# Patient Record
Sex: Male | Born: 1950 | Race: White | Hispanic: No | Marital: Married | Smoking: Former smoker
Health system: Southern US, Community
[De-identification: ages and names within clinical notes are randomized; demographics above are authoritative.]

## PROBLEM LIST (undated history)

## (undated) DIAGNOSIS — I10 Essential (primary) hypertension: Secondary | ICD-10-CM

## (undated) DIAGNOSIS — D49519 Neoplasm of unspecified behavior of unspecified kidney: Secondary | ICD-10-CM

## (undated) DIAGNOSIS — E785 Hyperlipidemia, unspecified: Secondary | ICD-10-CM

## (undated) DIAGNOSIS — M1990 Unspecified osteoarthritis, unspecified site: Secondary | ICD-10-CM

## (undated) DIAGNOSIS — G629 Polyneuropathy, unspecified: Secondary | ICD-10-CM

## (undated) DIAGNOSIS — C801 Malignant (primary) neoplasm, unspecified: Secondary | ICD-10-CM

## (undated) DIAGNOSIS — Z87442 Personal history of urinary calculi: Secondary | ICD-10-CM

## (undated) DIAGNOSIS — R269 Unspecified abnormalities of gait and mobility: Secondary | ICD-10-CM

## (undated) DIAGNOSIS — E1142 Type 2 diabetes mellitus with diabetic polyneuropathy: Secondary | ICD-10-CM

## (undated) DIAGNOSIS — G5603 Carpal tunnel syndrome, bilateral upper limbs: Secondary | ICD-10-CM

## (undated) DIAGNOSIS — G459 Transient cerebral ischemic attack, unspecified: Secondary | ICD-10-CM

## (undated) DIAGNOSIS — G4733 Obstructive sleep apnea (adult) (pediatric): Secondary | ICD-10-CM

## (undated) DIAGNOSIS — H409 Unspecified glaucoma: Secondary | ICD-10-CM

## (undated) DIAGNOSIS — K219 Gastro-esophageal reflux disease without esophagitis: Secondary | ICD-10-CM

## (undated) DIAGNOSIS — Z9989 Dependence on other enabling machines and devices: Secondary | ICD-10-CM

## (undated) DIAGNOSIS — E119 Type 2 diabetes mellitus without complications: Secondary | ICD-10-CM

## (undated) HISTORY — PX: COLONOSCOPY: SHX174

## (undated) HISTORY — DX: Carpal tunnel syndrome, bilateral upper limbs: G56.03

## (undated) HISTORY — DX: Type 2 diabetes mellitus with diabetic polyneuropathy: E11.42

## (undated) HISTORY — DX: Dependence on other enabling machines and devices: Z99.89

## (undated) HISTORY — DX: Morbid (severe) obesity due to excess calories: E66.01

## (undated) HISTORY — PX: NASAL SEPTUM SURGERY: SHX37

## (undated) HISTORY — DX: Obstructive sleep apnea (adult) (pediatric): G47.33

## (undated) HISTORY — PX: BACK SURGERY: SHX140

## (undated) HISTORY — DX: Transient cerebral ischemic attack, unspecified: G45.9

## (undated) HISTORY — DX: Hyperlipidemia, unspecified: E78.5

## (undated) HISTORY — DX: Unspecified abnormalities of gait and mobility: R26.9

## (undated) HISTORY — PX: SPINAL FUSION: SHX223

## (undated) HISTORY — DX: Unspecified glaucoma: H40.9

## (undated) HISTORY — PX: OTHER SURGICAL HISTORY: SHX169

---

## 1998-07-09 ENCOUNTER — Ambulatory Visit: Admission: RE | Admit: 1998-07-09 | Discharge: 1998-07-09 | Payer: Self-pay | Admitting: Otolaryngology

## 1998-08-24 ENCOUNTER — Ambulatory Visit: Admission: RE | Admit: 1998-08-24 | Discharge: 1998-08-24 | Payer: Self-pay | Admitting: Otolaryngology

## 2000-07-29 ENCOUNTER — Ambulatory Visit (HOSPITAL_COMMUNITY): Admission: RE | Admit: 2000-07-29 | Discharge: 2000-07-29 | Payer: Self-pay | Admitting: Family Medicine

## 2000-07-29 ENCOUNTER — Encounter: Payer: Self-pay | Admitting: Family Medicine

## 2001-04-18 ENCOUNTER — Ambulatory Visit (HOSPITAL_COMMUNITY): Admission: RE | Admit: 2001-04-18 | Discharge: 2001-04-18 | Payer: Self-pay | Admitting: Cardiology

## 2004-10-18 ENCOUNTER — Encounter: Admission: RE | Admit: 2004-10-18 | Discharge: 2005-01-16 | Payer: Self-pay | Admitting: Family Medicine

## 2005-04-06 ENCOUNTER — Ambulatory Visit (HOSPITAL_BASED_OUTPATIENT_CLINIC_OR_DEPARTMENT_OTHER): Admission: RE | Admit: 2005-04-06 | Discharge: 2005-04-06 | Payer: Self-pay | Admitting: *Deleted

## 2005-04-10 ENCOUNTER — Ambulatory Visit: Payer: Self-pay | Admitting: Internal Medicine

## 2005-07-12 ENCOUNTER — Encounter: Admission: RE | Admit: 2005-07-12 | Discharge: 2005-07-12 | Payer: Self-pay | Admitting: Family Medicine

## 2005-07-20 ENCOUNTER — Encounter: Admission: RE | Admit: 2005-07-20 | Discharge: 2005-07-20 | Payer: Self-pay | Admitting: Family Medicine

## 2005-07-22 ENCOUNTER — Encounter: Admission: RE | Admit: 2005-07-22 | Discharge: 2005-07-22 | Payer: Self-pay | Admitting: Family Medicine

## 2006-04-07 ENCOUNTER — Encounter: Admission: RE | Admit: 2006-04-07 | Discharge: 2006-04-07 | Payer: Self-pay | Admitting: Orthopaedic Surgery

## 2006-07-05 ENCOUNTER — Inpatient Hospital Stay (HOSPITAL_COMMUNITY): Admission: RE | Admit: 2006-07-05 | Discharge: 2006-07-07 | Payer: Self-pay | Admitting: Orthopaedic Surgery

## 2007-07-06 ENCOUNTER — Encounter: Admission: RE | Admit: 2007-07-06 | Discharge: 2007-07-06 | Payer: Self-pay | Admitting: Orthopaedic Surgery

## 2007-07-18 ENCOUNTER — Inpatient Hospital Stay (HOSPITAL_COMMUNITY): Admission: RE | Admit: 2007-07-18 | Discharge: 2007-07-20 | Payer: Self-pay | Admitting: Orthopaedic Surgery

## 2008-08-18 IMAGING — CR DG LUMBAR SPINE 1V
1 series · 1 of 1 positions shown · non-contrast
Comparison: 07/07/2006

LUMBAR SPINE - ONE VIEW:

CLINICAL DATA: L4-L5 revision laminectomy with fusion and pedicle screws.

[view not recorded]
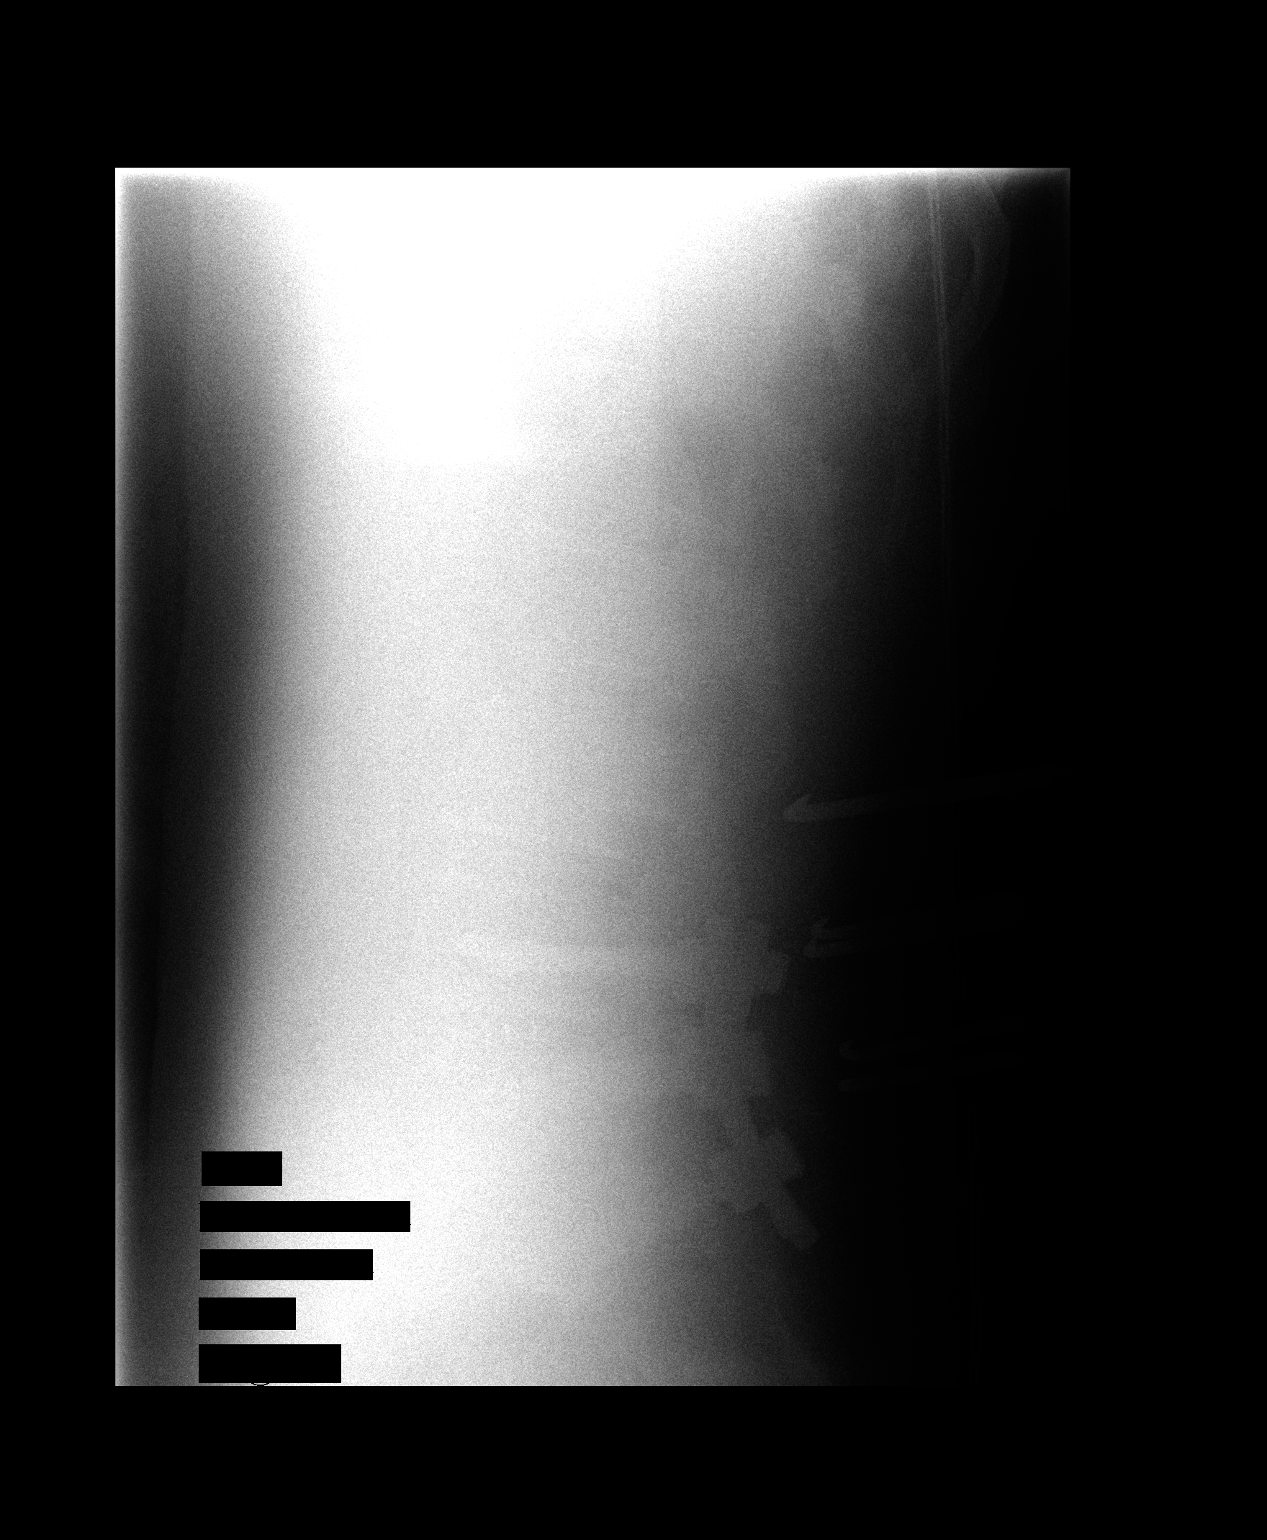

[1 of 1 positions shown; findings below may reference images not displayed]

FINDINGS: Intraoperative portable film obtained using crosstable lateral
technique at 0173 hours is limited by technique and body habitus. Bony anatomy
is not well demonstrated. Pedicle screws are seen at 3 levels and there are
posterior soft tissue retractors in the lower back. The patient had pedicle
screws previously at L5-S1 and the most cranial screws on this exam are probably
at the L4 level.
IMPRESSION: Intraoperative localization. Consider nonportable dedicated lumbar spine films
to more thoroughly characterize bony anatomy and fusion hardware.

## 2009-10-17 ENCOUNTER — Encounter: Admission: RE | Admit: 2009-10-17 | Discharge: 2009-10-17 | Payer: Self-pay | Admitting: Orthopaedic Surgery

## 2010-07-19 ENCOUNTER — Encounter: Admission: RE | Admit: 2010-07-19 | Discharge: 2010-07-19 | Payer: Self-pay | Admitting: Family Medicine

## 2011-03-15 NOTE — Op Note (Signed)
NAME:  TRACE, WIRICK NO.:  [id number]   MEDICAL RECORD NO.:  [id number]          PATIENT TYPE:  INP   LOCATION:  5011                         FACILITY:  [facility]   PHYSICIAN:  Sharolyn Douglas, M.D.        DATE OF BIRTH:  October 18, 1951   DATE OF PROCEDURE:  07/18/2007  DATE OF DISCHARGE:                               OPERATIVE REPORT   DIAGNOSES:  1. 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  2. Severe right lower extremity radiculopathy.  3. Obesity.   PROCEDURE:  1. Removal of L5-S1 instrumentation with exploration of L5-S1 fusion.  2. Revision L4-5 laminectomy with decompression of the thecal sac and      removal of a large disk herniation.  3. Posterior spinal fusion L4-5.  4. Segmental pedicle screw instrumentation L4 through S1 using the      Abbott spine system.  5. Transforaminal lumbar interbody fusion L4-5 with placement of 10-mm      PEEK cage times two.  6. Local autogenous bone graft supplemented with through OP-1 BMP.   SURGEON:  Sharolyn Douglas, M.D.   ASSISTANT:  Orlin Hilding, P.A.   ANESTHESIA:  General endotracheal.   ESTIMATED BLOOD LOSS:  50 mL.   COMPLICATIONS:  None.   NEEDLE AND SPONGE COUNT:  Correct.   INDICATIONS:  The patient is a pleasant 60 year old male who is status  post previous L5-S1 fusion for spondylolisthesis.  He did very well  after that surgery and had made a complete recovery.  Recently he was  lying down working on a motorcycle and when he went to get up developed  severe back and right leg pain.  MRI scan shows a large disk herniation  at L4-5 above the fusion with near stenosis and compression of the L5  nerve root.  Due to his severe intractable pain he elected to undergo  extension of the decompression and fusion of the L4-5 segment.  Risks,  benefits and alternatives were reviewed and patient elected to proceed.   DESCRIPTION OF PROCEDURE:  After informed consent, he was  taken to the  operating room.  He underwent general endotracheal anesthesia without  difficulty and given prophylactic IV antibiotics.  Neuromonitoring was  established in the form of lower extremity EMGs and SSEPs.  He carefully  turned prone onto the Wilson frame.  All bony prominences were padded.  Face and eyes were protected at all times.  The back was prepped and  draped in the usual sterile fashion.  The previous incision was reopened  and dissection was carried through the scar.  A subperiosteal exposure  was carried out over the instrumentation at L5-S1 and the transverse  processes of L4.  Due to the scarring and also the patient's large body  habitus, this was somewhat difficult.  We placed deep retractors.  We  then removed the instrumentation at L5-S1 by using the appropriate  screwdrivers to loosen the locking caps and then removed the rod.  We  then performed further dissection using the electrocautery out  over the  facet joints.  We confirmed that there was a solid arthrodesis between  L5 and S1 posteriorly.  We then turned our attention to placing pedicle  screws at L4.   Using anatomic probing technique each pedicle starting point was  initiated using the awl.  The pedicles were then cannulated.  The  pedicles were then palpated using a ball feeler.  There were no  breaches.  Each pedicle was then tapped.  We placed 6.5 x 15 mm screws  bilaterally.  We then used triggered EMGs testing the pedicle screws at  L4.  There were no deleterious changes.   We then turned our attention to performing a revision laminectomy.  The  edges of the previous laminotomy defects were carefully dissected using  loupes and headlight magnification.  Once we had entered into the spinal  canal, the high-speed bur and Kerrison punches were used to remove the  spinous process and lamina of L4.  We performed a wide laminectomy.  We  decompressed the right L5 nerve root.  We encountered some  scarring and  this was dissected again with the curettes and then the lateral recess  was decompressed.  Once we were satisfied with the decompression, we  turned our attention to complete the posterior spinal fusion.   High-speed bur was used to decorticate the transverse process of L4 and  also the previous fusion mass at L5-S1.  Local bone graft obtained from  the laminectomy was packed into the lateral gutters between L4 and L5.   At this point we elected to proceed with transforaminal lumbar interbody  fusion in order to further remove the disk herniation and also improve  the fusion rate.  The remaining facet joint on the right side at L4-5  was osteotomized.  A transforaminal window was created.  The exiting and  traversing nerve roots were identified and protected all times.  Free  running EMGs were monitored.  The disk space was entered and a radical  diskectomy was completed.  There was a large disk herniation which was  displacing the thecal sac and this was decompressed back into the  interspace using Epstein curettes and then removed.  This provided  additional decompression of the nerve root.  We then dilated the disk  space up to 10 mm.  The cartilaginous endplates were scraped clean using  sharp curettes.  The disk space was irrigated.  The disk space was then  packed with local bone graft obtained from the laminectomy.  We then  inserted a 10-mm PEEK cage which had been packed with the OP-1 BMP.  The  cage was tamped anteriorly and then flipped horizontally.  This allowed  [place] to place a second cage into the interspace, again by tamping it  anteriorly and then flipping it longitudinally.  We then placed 70-mm  titanium rods into the polyaxial screw heads and compression was applied  across the L4-5 segment before shearing off the locking caps.  Hemostasis was achieved.  The wound was irrigated.  Gelfoam was left  over the exposed epidural space.  A deep Hemovac drain was  left.  The  deep fascia was closed with a running #1 Vicryl suture.  Subcutaneous  layer was closed with 0 Vicryl and 2-0 Vicryl followed by a running 3-0  subcuticular Vicryl suture on the skin edges.  Dermabond was applied.  Sterile dressing was placed.  The patient turned supine, extubated  without difficulty and transferred to recovery in stable  condition.   It should be noted my assistant Orlin Hilding, P.A. was present throughout  the procedure.  She assisted me with the positioning.  She assisted me  with the exposure using the Cobb's and suction.  She then worked with me  using loupes and headlight magnification during the decompression,  providing retraction of the neural elements and suction.  She also  assisted with the instrumentation and the arthrodesis and then assisted  with wound closure.      Sharolyn Douglas, M.D.  Electronically Signed     MC/MEDQ  D:  07/18/2007  T:  07/19/2007  Job:  [id number]

## 2011-03-18 NOTE — Op Note (Signed)
NAME:  Marvin Rivas, Marvin Rivas NO.:  [id number]   MEDICAL RECORD NO.:  [id number]          PATIENT TYPE:  INP   LOCATION:  5023                         FACILITY:  [facility]   PHYSICIAN:  Sharolyn Douglas, M.D.        DATE OF BIRTH:  [birth year]   DATE OF PROCEDURE:  07/05/2006  DATE OF DISCHARGE:                                 OPERATIVE REPORT   DIAGNOSES:  1. Isthmic L5-S1 mobile spondylolisthesis.  2. Severe L5-S1 foraminal narrowing, right greater than left, with      radiculopathy.   PROCEDURE:  1. L5-S1 laminectomy with wide decompression of the thecal sac and L5 and      S1 nerve roots bilaterally.  2. L5-S1 transforaminal lumbar interbody fusion with placement of 11-mm      PEEK cage.  3. Pedicle screw instrumentation, L5-S1, using Abbott spine system.  4. Posterior spinal arthrodesis, L5-S1.  5. Local autogenous bone graft supplemented with bone morphogenic protein.   SURGEON:  Sharolyn Douglas, M.D.   ASSISTANT:  Verlin Fester, P.A.   ANESTHESIA:  General endotracheal.   ESTIMATED BLOOD LOSS:  400 mL.   COMPLICATIONS:  None.  Needle and sponge count correct.   INDICATIONS:  The patient is a pleasant 60 year old male with progressive  back and right lower extremity pain.  He has failed to respond to  conservative treatment and now elects to undergo L5-S1 decompression and  fusion.  His imaging studies show a mobile spondylolisthesis at L5-S1 with  severe foraminal narrowing, right greater than left.  Risks, benefits,  alternatives reviewed.  The patient elected to proceed.   PROCEDURE:  After informed consent, the patient was taken to the operating  room.  He underwent general endotracheal anesthesia without difficulty,  given prophylactic IV antibiotics.  Neuro monitoring established in the form  of lower extremity and upper extremity SSEPs and EMGs.  The patient was then  turned prone onto the __________ four-poster positioning frame.  All bony  prominences were  padded.  Because of his large abdomen, great care was taken  in the positioning process.  Because of his size, of course, it was  impossible to completely decompress his belly.  Once we were satisfied with  the positioning, the back was prepped and draped in the usual sterile  fashion.  A midline incision was made from L4 down to S1.  Dissection was  carried sharply through the deep fascia.  The spinous processes were exposed  and then further subperiosteal exposure to the tips of the transverse  processes of L5-S1 were exposed.  The L5 spinous process was easily  identifiable because it was loose secondary to the pars fracture.  Intraoperative x-ray was taken to confirm our levels.  We then removed the  entire spinous process and lamina of L5.  Further decompression removed the  overlying ligamentum flavum using Kerrison punches, loops, and headlight  magnification.  We then identified the L5 nerve roots bilaterally on the  right side.  This was severely encumbered by the slip and trapping of the  nerve root between the L5  pedicle and sacrum.  In addition, there was  fibrocartilaginous material related to the pars defect which was encroaching  on the foramen.  This was all carefully decompressed.  We found similar but  less severe findings on the left side.  Once we were satisfied with our  decompression of the L5-S1 nerve roots bilaterally, we turned our attention  to placing pedicle screws at L5 and S1 using anatomic probing technique.  The pedicles could be palpated from within the spinal canal.  We placed 6.5  x 45-mm screws in L5 and 6.5 x 35-mm screws in the sacrum.  We had good  screw purchase.  Each screw was stimulated using triggered EMGs, and there  were no deleterious changes.  We then turned our attention to completing a  transforaminal lumbar interbody fusion on the right side at L5-S1.  This was  felt to be necessary for further decompression of neural foramen by  providing  for distraction and also to improve the fusion rate considering  the patient has a mobile listhesis.  The remaining facette joint was  osteotomized on the right side.  Dissection was carried down, identifying  the L5 and S1 nerve roots, and great care was taken to protect these  structures.  Free running EMGs were monitored, and disk space was entered.  Radical diskectomy completed across to the contralateral side.  The  cartilaginous endplates were scraped clean.  The disk space was then packed  with local bone graft along with BMP sponges.  An 11-mm PEEK cage was then  inserted into the disk space after distracting on the pedicle screws.  This  was carefully tamped anteriorly and across the midline.  We had good  distraction.  We then rechecked the foramen and found that the L5 nerve  roots were widely decompressed.  Intraoperative x-ray was taken which showed  good positioning of the pedicle screws and also the interbody graft with  good distraction and complete reduction of the spondylolisthesis.  We then  placed short segment titanium rods across the L5-S1 pedicle screws before  shearing off the locking caps and providing for compression.  We then  completed the posterior spinal fusion by decorticating the transverse  processes of L5 and the sacral ala bilaterally.  The remaining local bone  graft was then packed tightly into the lateral gutters.  The dura was  examined, and there was an area along the left side at approximately the  level of the L5 nerve root which had thinned from the decompression.  There  was no definite CSF leakage, but we chose to reinforce this with Tisseel  fibrin glue.  We then closed the fascia with a running #1 Vicryl suture.  Subcutaneous layer closed with 0 Vicryl and 2-0 Vicryl followed by a running  3-0 subcuticular Vicryl suture on the skin edges.  Benzoin and Steri-Strips placed.  Sterile dressing applied.  The patient was turned supine, extubated   without difficulty and transferred to recovery in stable condition.  He was  able to move his upper lower extremities.  It should be noted that my  assistant, Verlin Fester, P.A., was present throughout the procedure  including during the positioning, the exposure, the decompression, the  instrumentation and the fusion, and she also assisted with the entire wound  closure.      Sharolyn Douglas, M.D.  Electronically Signed     MC/MEDQ  D:  07/05/2006  T:  07/06/2006  Job:  [id number]

## 2011-03-18 NOTE — Procedures (Signed)
NAME:  Marvin Rivas, GURA NO.:  [id number]   MEDICAL RECORD NO.:  [id number]          PATIENT TYPE:  OUT   LOCATION:  SLEEP CENTER                 FACILITY:  [facility]   PHYSICIAN:  Clinton D. Maple Hudson, M.D. DATE OF BIRTH:  08-27-1951   DATE OF STUDY:  04/06/2005                              NOCTURNAL POLYSOMNOGRAM   REFERRING PHYSICIAN:  Dr. Donia Guiles   DATE OF STUDY:  April 06, 2005   INDICATION FOR STUDY:  Hypersomnia with sleep apnea.  Epworth Sleepiness  Score 17/24, BMI 47, weight 350 pounds.  CPAP titration is requested.  Previous CPAP titration in 1999 set at 11 CWP.  He had been using an Ultra  Mirage Mask and had lost about 20 pounds.   SLEEP ARCHITECTURE:  Total sleep time 377 minutes with sleep efficiency 92%.  Stage I was 8%, stage II 73%, stages III and IV absent, REM 11% of total  sleep time, sleep latency 5 minutes, REM latency 63 minutes, awake after  sleep onset 24 minutes, arousal index 3.5.  No bedtime medication was  reported.   RESPIRATORY DATA:  CPAP titration protocol.  Titration was begun at 10 CWP  with RDI 0 and continued by the technician up to 15 CWP, RDI 3.5 per hour,  because of arousals and snoring.  His own Ultra Mirage Mask was used.  Technician also used a heated humidifier and the patient commented he did  not have to use nasal spray throughout the night as he usually does at home.   OXYGEN DATA:  Snoring before CPAP control.  Oxygen desaturation to a nadir  of 89% with mean oxygen saturation through the study on CPAP 96% on room  air.   CARDIAC DATA:  Normal sinus rhythm with frequent PVCs.   MOVEMENT/PARASOMNIA:  Occasional leg jerk with arousal, insignificant.   IMPRESSION/RECOMMENDATION:  Successful continuous positive airway pressure  titration to 15 CWP, respiratory disturbance index 3.5 per hour using  patient's Ultra Mirage Mask with heated humidifier.  This pressure can be  tried at home.  Note that he had little  breakthrough apnea at any pressure  setting 10-15 but he did have residual snoring until 15 CWP.     Clinton D. Maple Hudson, M.D.  Diplomat   CDY/MEDQ  D:  04/10/2005 11:41:23  T:  04/10/2005 13:26:05  Job:  [id number]

## 2011-03-18 NOTE — H&P (Signed)
NAME:  Marvin Rivas, Marvin Rivas NO.:  [id number]   MEDICAL RECORD NO.:  [id number]          PATIENT TYPE:  INP   LOCATION:  NA                           FACILITY:  [facility]   PHYSICIAN:  Sharolyn Douglas, M.D.        DATE OF BIRTH:  August 15, 1951   DATE OF ADMISSION:  07/05/2006  DATE OF DISCHARGE:                                HISTORY & PHYSICAL   CHIEF COMPLAINT:  Low back and right lower extremity pain.   HISTORY OF PRESENT ILLNESS:  The patient is a 60 year old male who was found  to have an L5-S1 spondylolisthesis and spinal stenosis.  He has failed  conservative management.  His pain is increasing and severe.  It is  interfering with his activities of daily living and quality of life.  Secondary to his x-ray and MRI findings as well as his failure to improve  with conservative treatment and his continued pain, it is felt that his best  course of management would be a posterior spinal fusion at L5-S1.  Risks and  benefits of this surgery were discussed with the patient by Dr. Noel Gerold as  well as myself; he indicated understanding and opted to proceed.   ALLERGIES:  None.   MEDICATIONS:  1. Lodine 400 mg twice daily.  2. Diovan HCT 60/12.5 mg daily.  3. Lotrel 10/20 mg daily.  4. Simvastatin daily.   PAST MEDICAL HISTORY:  1. Diet-controlled diabetes.  2. Hypertension.   PAST SURGICAL HISTORY:  Deviated septum surgery.   SOCIAL HISTORY:  The patient denies tobacco use and denies alcohol use.  He  is married.  His wife will be available to help him through his  postoperative course.   FAMILY MEDICAL HISTORY:  Noncontributory.   REVIEW OF SYSTEMS:  The patient denies fevers, chills, sweats or bleeding  tendencies.  CNS:  Denies blurred vision, double vision, seizures, headache  or paralysis.  CARDIOVASCULAR:  Denies chest pain, angina, orthopnea,  claudication or palpitations.  PULMONARY:  Denies shortness of breath,  productive cough or hemoptysis.  GI:  Denies nausea,  vomiting, constipation,  diarrhea, melena or bloody stools.  GU:  Denies dysuria, hematuria or  discharge.  MUSCULOSKELETAL:  As per HPI.   PHYSICAL EXAMINATION:  VITAL SIGNS:  Blood pressure is 110/68.  Respirations  are 16 and unlabored.  Pulse is 78 and regular.  GENERAL APPEARANCE:  The patient is a 60 year old white male who is alert  and oriented, in no acute distress.  He is well-nourished, well-groomed and  appears his stated age, pleasant and cooperative to exam.  HEENT:  Head is normocephalic, atraumatic.  Pupils are equal, round and  reactive to light.  Extraocular movements intact.  Nares patent.  Pharynx is  clear.  NECK:  Soft to palpation.  No lymphadenopathy or thyromegaly noted.  No  bruits appreciated.  CHEST:  Clear to auscultation bilaterally.  No rales, rhonchi, stridor,  wheezes or friction rubs.  BREASTS:  Not pertinent and not performed.  HEART:  S1 and S2, regular rate and rhythm with no murmurs, gallops or rubs  noted.  ABDOMEN:  Soft to palpation, nontender and non-distended.  No organomegaly  noted.  Obese abdomen.  Positive bowel sounds throughout.  GU:  Not pertinent and not performed.  EXTREMITIES:  As per HPI.  SKIN:  Intact without any lesions or rashes.   IMPRESSION:  1. L5-S1 spondylolisthesis and spinal stenosis.  2. Diet-controlled diabetes.  3. Hypertension.   PLAN:  Admit to [facility] on July 05, 2006 for an L5-S1  posterior spinal fusion; this will be done by Dr. Noel Gerold.  The patient's  primary care doctor is Dr. Manus Gunning.      Verlin Fester, P.A.      Sharolyn Douglas, M.D.  Electronically Signed    CM/MEDQ  D:  06/27/2006  T:  06/27/2006  Job:  [id number]

## 2011-08-11 LAB — URINALYSIS, ROUTINE W REFLEX MICROSCOPIC
Glucose, UA: NEGATIVE
Ketones, ur: NEGATIVE
Nitrite: NEGATIVE
Protein, ur: NEGATIVE

## 2011-08-11 LAB — COMPREHENSIVE METABOLIC PANEL
ALT: 24
AST: 21
Albumin: 3.9
Alkaline Phosphatase: 51
BUN: 17
CO2: 26
Calcium: 9.5
Creatinine, Ser: 1.04
GFR calc Af Amer: >60
GFR calc non Af Amer: >60
Glucose, Bld: 122 — ABNORMAL HIGH
Potassium: 4
Total Bilirubin: 0.7
Total Protein: 6.8

## 2011-08-11 LAB — BASIC METABOLIC PANEL
BUN: 10
CO2: 30
Calcium: 8.6
Chloride: 106
Creatinine, Ser: 1.03
GFR calc Af Amer: >60
GFR calc non Af Amer: >60
Glucose, Bld: 108 — ABNORMAL HIGH
Glucose, Bld: 120 — ABNORMAL HIGH
Sodium: 141

## 2011-08-11 LAB — TYPE AND SCREEN: Antibody Screen: NEGATIVE

## 2011-08-11 LAB — HEMOGLOBIN AND HEMATOCRIT, BLOOD
HCT: 34.2 — ABNORMAL LOW
HCT: 35 — ABNORMAL LOW
Hemoglobin: 12.1 — ABNORMAL LOW

## 2011-08-11 LAB — DIFFERENTIAL
Basophils Relative: 1
Eosinophils Absolute: 0.3
Eosinophils Relative: 5
Monocytes Absolute: 0.4

## 2011-08-11 LAB — CBC
MCV: 88.1
RBC: 4.84
RDW: 12.9

## 2011-08-11 LAB — URINE CULTURE

## 2011-08-11 LAB — URINE MICROSCOPIC-ADD ON

## 2011-08-11 LAB — APTT: aPTT: 30

## 2011-08-11 LAB — PROTIME-INR: Prothrombin Time: 12.8

## 2012-12-21 ENCOUNTER — Emergency Department (HOSPITAL_BASED_OUTPATIENT_CLINIC_OR_DEPARTMENT_OTHER): Payer: BC Managed Care – PPO

## 2012-12-21 ENCOUNTER — Emergency Department (HOSPITAL_COMMUNITY): Payer: BC Managed Care – PPO

## 2012-12-21 ENCOUNTER — Observation Stay (HOSPITAL_BASED_OUTPATIENT_CLINIC_OR_DEPARTMENT_OTHER)
Admission: EM | Admit: 2012-12-21 | Discharge: 2012-12-23 | DRG: 832 | Disposition: A | Discharge status: Home / Self Care | Payer: BC Managed Care – PPO | Attending: Family Medicine | Admitting: Family Medicine

## 2012-12-21 ENCOUNTER — Encounter (HOSPITAL_BASED_OUTPATIENT_CLINIC_OR_DEPARTMENT_OTHER): Payer: Self-pay | Admitting: *Deleted

## 2012-12-21 DIAGNOSIS — R5383 Other fatigue: Secondary | ICD-10-CM

## 2012-12-21 DIAGNOSIS — R42 Dizziness and giddiness: Secondary | ICD-10-CM | POA: Insufficient documentation

## 2012-12-21 DIAGNOSIS — E1142 Type 2 diabetes mellitus with diabetic polyneuropathy: Secondary | ICD-10-CM | POA: Insufficient documentation

## 2012-12-21 DIAGNOSIS — E1149 Type 2 diabetes mellitus with other diabetic neurological complication: Secondary | ICD-10-CM | POA: Insufficient documentation

## 2012-12-21 DIAGNOSIS — R262 Difficulty in walking, not elsewhere classified: Secondary | ICD-10-CM | POA: Insufficient documentation

## 2012-12-21 DIAGNOSIS — N400 Enlarged prostate without lower urinary tract symptoms: Secondary | ICD-10-CM | POA: Diagnosis present

## 2012-12-21 DIAGNOSIS — R5381 Other malaise: Secondary | ICD-10-CM

## 2012-12-21 DIAGNOSIS — M545 Low back pain, unspecified: Secondary | ICD-10-CM | POA: Insufficient documentation

## 2012-12-21 DIAGNOSIS — Z6841 Body Mass Index (BMI) 40.0 and over, adult: Secondary | ICD-10-CM | POA: Insufficient documentation

## 2012-12-21 DIAGNOSIS — R269 Unspecified abnormalities of gait and mobility: Secondary | ICD-10-CM | POA: Insufficient documentation

## 2012-12-21 DIAGNOSIS — E119 Type 2 diabetes mellitus without complications: Secondary | ICD-10-CM

## 2012-12-21 DIAGNOSIS — R4789 Other speech disturbances: Principal | ICD-10-CM | POA: Insufficient documentation

## 2012-12-21 DIAGNOSIS — I10 Essential (primary) hypertension: Secondary | ICD-10-CM | POA: Insufficient documentation

## 2012-12-21 DIAGNOSIS — G4733 Obstructive sleep apnea (adult) (pediatric): Secondary | ICD-10-CM | POA: Insufficient documentation

## 2012-12-21 DIAGNOSIS — I6350 Cerebral infarction due to unspecified occlusion or stenosis of unspecified cerebral artery: Secondary | ICD-10-CM

## 2012-12-21 DIAGNOSIS — I639 Cerebral infarction, unspecified: Secondary | ICD-10-CM

## 2012-12-21 DIAGNOSIS — H538 Other visual disturbances: Secondary | ICD-10-CM | POA: Insufficient documentation

## 2012-12-21 DIAGNOSIS — R4781 Slurred speech: Secondary | ICD-10-CM

## 2012-12-21 HISTORY — DX: Essential (primary) hypertension: I10

## 2012-12-21 HISTORY — DX: Type 2 diabetes mellitus without complications: E11.9

## 2012-12-21 HISTORY — DX: Polyneuropathy, unspecified: G62.9

## 2012-12-21 LAB — DIFFERENTIAL
Basophils Absolute: 0 10*3/uL (ref 0.0–0.1)
Lymphocytes Relative: 33 % (ref 12–46)
Lymphs Abs: 2.4 10*3/uL (ref 0.7–4.0)
Neutro Abs: 3.9 10*3/uL (ref 1.7–7.7)

## 2012-12-21 LAB — HEMOGLOBIN A1C: Hgb A1c MFr Bld: 6.2 % — ABNORMAL HIGH (ref <5.7)

## 2012-12-21 LAB — POCT I-STAT 3, ART BLOOD GAS (G3+)
Acid-Base Excess: 1 mmol/L (ref 0.0–2.0)
Bicarbonate: 26.1 mEq/L — ABNORMAL HIGH (ref 20.0–24.0)
Patient temperature: 98.7
TCO2: 27 mmol/L (ref 0–100)
pH, Arterial: 7.391 (ref 7.350–7.450)

## 2012-12-21 LAB — CBC
Platelets: 252 10*3/uL (ref 150–400)
RBC: 4.66 MIL/uL (ref 4.22–5.81)
RDW: 13.1 % (ref 11.5–15.5)
WBC: 7.1 10*3/uL (ref 4.0–10.5)

## 2012-12-21 LAB — URINALYSIS, ROUTINE W REFLEX MICROSCOPIC
Bilirubin Urine: NEGATIVE
Glucose, UA: NEGATIVE mg/dL
Hgb urine dipstick: NEGATIVE
Ketones, ur: NEGATIVE mg/dL
Protein, ur: NEGATIVE mg/dL

## 2012-12-21 LAB — COMPREHENSIVE METABOLIC PANEL
ALT: 18 U/L (ref 0–53)
AST: 16 U/L (ref 0–37)
Albumin: 3.6 g/dL (ref 3.5–5.2)
Alkaline Phosphatase: 52 U/L (ref 39–117)
BUN: 20 mg/dL (ref 6–23)
Chloride: 102 mEq/L (ref 96–112)
Potassium: 3.6 mEq/L (ref 3.5–5.1)
Total Bilirubin: 0.2 mg/dL — ABNORMAL LOW (ref 0.3–1.2)

## 2012-12-21 LAB — RAPID URINE DRUG SCREEN, HOSP PERFORMED
Amphetamines: NOT DETECTED
Benzodiazepines: NOT DETECTED
Cocaine: NOT DETECTED
Opiates: NOT DETECTED
Tetrahydrocannabinol: NOT DETECTED

## 2012-12-21 LAB — PROTIME-INR: INR: 0.87 (ref 0.00–1.49)

## 2012-12-21 LAB — ETHANOL: Alcohol, Ethyl (B): <11 mg/dL (ref 0–11)

## 2012-12-21 LAB — GLUCOSE, CAPILLARY: Glucose-Capillary: 164 mg/dL — ABNORMAL HIGH (ref 70–99)

## 2012-12-21 LAB — TSH: TSH: 0.775 u[IU]/mL (ref 0.350–4.500)

## 2012-12-21 MED ORDER — STUDY - INVESTIGATIONAL DRUG SIMPLE RECORD
600.0000 mg | Status: AC
  Administered 2012-12-21: 600 mg via ORAL
  Filled 2012-12-21: qty 600

## 2012-12-21 MED ORDER — TAMSULOSIN HCL 0.4 MG PO CAPS
0.4000 mg | ORAL_CAPSULE | Freq: Every day | ORAL | Status: DC
  Administered 2012-12-22 – 2012-12-23 (×2): 0.4 mg via ORAL
  Filled 2012-12-21 (×2): qty 1

## 2012-12-21 MED ORDER — ONDANSETRON HCL 4 MG/2ML IJ SOLN: 4.0000 mg | Freq: Four times a day (QID) | INTRAMUSCULAR | Status: DC | PRN

## 2012-12-21 MED ORDER — ALUM & MAG HYDROXIDE-SIMETH 200-200-20 MG/5ML PO SUSP: 30.0000 mL | Freq: Four times a day (QID) | ORAL | Status: DC | PRN

## 2012-12-21 MED ORDER — SODIUM CHLORIDE 0.9 % IV SOLN
INTRAVENOUS | Status: DC
  Administered 2012-12-21 – 2012-12-22 (×2): via INTRAVENOUS

## 2012-12-21 MED ORDER — ONDANSETRON HCL 4 MG PO TABS: 4.0000 mg | ORAL_TABLET | Freq: Four times a day (QID) | ORAL | Status: DC | PRN

## 2012-12-21 MED ORDER — STUDY - INVESTIGATIONAL DRUG SIMPLE RECORD
75.0000 mg | Freq: Every day | Status: DC
  Administered 2012-12-22 – 2012-12-23 (×2): 75 mg via ORAL
  Filled 2012-12-21 (×2): qty 75

## 2012-12-21 MED ORDER — HEPARIN SODIUM (PORCINE) 5000 UNIT/ML IJ SOLN
5000.0000 [IU] | Freq: Three times a day (TID) | INTRAMUSCULAR | Status: DC
  Administered 2012-12-21 – 2012-12-22 (×2): 5000 [IU] via SUBCUTANEOUS
  Filled 2012-12-21 (×6): qty 1

## 2012-12-21 MED ORDER — ACETAMINOPHEN 650 MG RE SUPP: 650.0000 mg | Freq: Four times a day (QID) | RECTAL | Status: DC | PRN

## 2012-12-21 MED ORDER — LOSARTAN POTASSIUM-HCTZ 100-12.5 MG PO TABS: 1.0000 | ORAL_TABLET | Freq: Every day | ORAL | Status: DC

## 2012-12-21 MED ORDER — LOSARTAN POTASSIUM 50 MG PO TABS
100.0000 mg | ORAL_TABLET | Freq: Every day | ORAL | Status: DC
  Administered 2012-12-22: 100 mg via ORAL
  Filled 2012-12-21: qty 2

## 2012-12-21 MED ORDER — ACETAMINOPHEN 325 MG PO TABS: 650.0000 mg | ORAL_TABLET | Freq: Four times a day (QID) | ORAL | Status: DC | PRN

## 2012-12-21 MED ORDER — INSULIN ASPART 100 UNIT/ML ~~LOC~~ SOLN
0.0000 [IU] | Freq: Three times a day (TID) | SUBCUTANEOUS | Status: DC
  Administered 2012-12-22: 1 [IU] via SUBCUTANEOUS

## 2012-12-21 MED ORDER — FINASTERIDE 5 MG PO TABS
5.0000 mg | ORAL_TABLET | Freq: Every day | ORAL | Status: DC
  Administered 2012-12-22 – 2012-12-23 (×2): 5 mg via ORAL
  Filled 2012-12-21 (×2): qty 1

## 2012-12-21 MED ORDER — HYDROCHLOROTHIAZIDE 12.5 MG PO CAPS
12.5000 mg | ORAL_CAPSULE | Freq: Every day | ORAL | Status: DC
  Administered 2012-12-22: 12.5 mg via ORAL
  Filled 2012-12-21: qty 1

## 2012-12-21 NOTE — H&P (Signed)
[facility] History and Physical  Marvin Rivas AVW:[id number] DOB: Feb 19, 1951 DOA: 12/21/2012  Referring physician: Eber Hong, MD PCP: Thora Lance, MD   Chief Complaint: Slurred speech  HPI: Marvin Rivas is a 62 y.o. male with past medical history of diabetes mellitus and hypertension came into the hospital because of slurred speech. Patient said he was in his usual several stool this morning when he went to work about 5:45 AM, he starts reading his e-mails and he couldn't read clearly, he feels he might told some saliva on his right side. Later he wanted to go home, so he called someone and he was being told he had some slurred speech. EMS was called and he was transferred to [facility], his blood sugar was okay, denies any fever or chills, denies any chest pain or palpitations. He was transferred to [facility] as code stroke initially, the neurologist deferred doing TPA because patient improved. CT scan was negative, MRI of the head was done later and showed no evidence of a stroke. Patient was still sleepy when I saw him but he is easy to arouse and he can't turn it conversation.   Review of Systems:  Constitutional: negative for anorexia, fevers and sweats Eyes: negative for irritation, redness and visual disturbance Ears, nose, mouth, throat, and face: negative for earaches, epistaxis, nasal congestion and sore throat Respiratory: negative for cough, dyspnea on exertion, sputum and wheezing Cardiovascular: negative for chest pain, dyspnea, lower extremity edema, orthopnea, palpitations and syncope Gastrointestinal: negative for abdominal pain, constipation, diarrhea, melena, nausea and vomiting Genitourinary:negative for dysuria, frequency and hematuria Hematologic/lymphatic: negative for bleeding, easy bruising and lymphadenopathy Musculoskeletal:negative for arthralgias, muscle weakness and stiff joints Neurological: Slurred speech per history of  present illness Endocrine: negative for diabetic symptoms including polydipsia, polyuria and weight loss Allergic/Immunologic: negative for anaphylaxis, hay fever and urticaria   Past Medical History  Diagnosis Date  . Diabetes mellitus without complication   . Hypertension    History reviewed. No pertinent past surgical history. Social History:  has no tobacco, alcohol, and drug history on file.   No Known Allergies  Family History  Problem Relation Age of Onset  . Prostate cancer Father     Prior to Admission medications   Medication Sig Start Date End Date Taking? Authorizing Provider  amLODipine-benazepril (LOTREL) 10-20 MG per capsule Take 1 capsule by mouth daily.   Yes Historical Provider, MD  finasteride (PROSCAR) 5 MG tablet Take 5 mg by mouth daily.   Yes Historical Provider, MD  gabapentin (NEURONTIN) 600 MG tablet Take 600 mg by mouth 3 (three) times daily.   Yes Historical Provider, MD  HYDROcodone-acetaminophen (NORCO) 10-325 MG per tablet Take 1 tablet by mouth every 6 (six) hours as needed for pain.   Yes Historical Provider, MD  losartan-hydrochlorothiazide (HYZAAR) 100-12.5 MG per tablet Take 1 tablet by mouth daily.   Yes Historical Provider, MD  OVER THE COUNTER MEDICATION Place 2 sprays into the nose 5 (five) times daily. [address] Nasal Spray  ([employer] brand)   Yes Historical Provider, MD  Tamsulosin HCl (FLOMAX) 0.4 MG CAPS Take 0.4 mg by mouth daily.    Yes Historical Provider, MD  traMADol (ULTRAM) 50 MG tablet Take 50 mg by mouth every 6 (six) hours as needed for pain.   Yes Historical Provider, MD   Physical Exam: Filed Vitals:   12/21/12 0905 12/21/12 0925 12/21/12 1025  BP: 144/62  135/62  Pulse: 87  81  Temp: 97.6 F (36.4 C) 98.1 F (36.7 C)   TempSrc: Oral    Resp: 20  16  Height: 5\' 9"  (1.753 m)    Weight: 149.687 kg (330 lb)    SpO2: 97%  99%   General appearance: alert, cooperative and no distress  Head: Normocephalic, without obvious  abnormality, atraumatic  Eyes: conjunctivae/corneas clear. PERRL, EOM's intact. Fundi benign.  Nose: Nares normal. Septum midline. Mucosa normal. No drainage or sinus tenderness.  Throat: lips, mucosa, and tongue normal; teeth and gums normal  Neck: Supple, no masses, no cervical lymphadenopathy, no JVD appreciated, no meningeal signs Resp: clear to auscultation bilaterally  Chest wall: no tenderness  Cardio: regular rate and rhythm, S1, S2 normal, no murmur, click, rub or gallop  GI: soft, non-tender; bowel sounds normal; no masses, no organomegaly  Extremities: extremities normal, atraumatic, no cyanosis or edema  Skin: Skin color, texture, turgor normal. No rashes or lesions  Neurologic: Alert and oriented X 3, normal strength and tone. Normal symmetric reflexes. Normal coordination and gait   Labs on Admission:  Basic Metabolic Panel:  Recent Labs Lab 12/21/12 0940  NA 138  K 3.6  CL 102  CO2 25  GLUCOSE 195*  BUN 20  CREATININE 0.90  CALCIUM 9.1   Liver Function Tests:  Recent Labs Lab 12/21/12 0940  AST 16  ALT 18  ALKPHOS 52  BILITOT 0.2*  PROT 6.8  ALBUMIN 3.6   No results found for this basename: LIPASE, AMYLASE,  in the last 168 hours No results found for this basename: AMMONIA,  in the last 168 hours CBC:  Recent Labs Lab 12/21/12 0920  WBC 7.1  NEUTROABS 3.9  HGB 14.3  HCT 41.5  MCV 89.1  PLT 252   Cardiac Enzymes:  Recent Labs Lab 12/21/12 0940  TROPONINI <0.30    BNP (last 3 results) No results found for this basename: PROBNP,  in the last 8760 hours CBG:  Recent Labs Lab 12/21/12 0938  GLUCAP 164*    Radiological Exams on Admission: Ct Head Wo Contrast  12/21/2012  *RADIOLOGY REPORT*  Clinical Data: Dizziness.  Diabetes and hypertension history.  CT HEAD WITHOUT CONTRAST  Technique:  Contiguous axial images were obtained from the base of the skull through the vertex without contrast.  Comparison: 07/19/2010.  Findings: No mass  lesion, mass effect, midline shift, hydrocephalus, hemorrhage.  No territorial ischemia or acute infarction.  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 Paranasal sinuses are within normal limits.  IMPRESSION: Negative CT head.  Critical Value/emergent results were called by telephone at the time of interpretation on 12/21/2012 at 0939 hours to Dr. Preston Fleeting, who verbally acknowledged these results.   Original Report Authenticated By: Andreas Newport, M.D.    Mr Brain Wo Contrast  12/21/2012  *RADIOLOGY REPORT*  Clinical Data: 62 year old male with slurred speech and 0630 hours.  MRI HEAD WITHOUT CONTRAST  Technique:  Multiplanar, multiecho pulse sequences of the brain and surrounding structures were obtained according to standard protocol without intravenous contrast.  Comparison: Head CT 12/21/2012.  Findings: No restricted diffusion to suggest acute infarction.  Study is mildly degraded by motion artifact despite repeated imaging attempts.  No midline shift, ventriculomegaly, mass effect, evidence of mass lesion, extra-axial collection or acute intracranial hemorrhage. Cervicomedullary junction and pituitary are within normal limits. Chronic micro hemorrhage posterior left temporal lobe (series 7 image 14).  No cortical encephalomalacia.  Wallace Cullens and white matter signal elsewhere is within normal limits. Major intracranial vascular  flow voids are preserved, dominant distal right vertebral artery.  Grossly negative visualized cervical spine.  Normal bone marrow signal. Visualized orbit soft tissues are within normal limits.  Visualized paranasal sinuses and mastoids are clear.  Negative scalp soft tissues.  IMPRESSION: 1. No acute intracranial abnormality. 2.  Largely unremarkable for age MRI appearance of the brain.  Study reviewed in person with Dr. Pearlean Brownie at the time of dictation.   Original Report Authenticated By: Erskine Speed, M.D.     EKG: Independently reviewed.   Assessment/Plan Principal Problem:    Slurred speech Active Problems:   Lethargy   BPH (benign prostatic hyperplasia)   Diabetes mellitus without complication   Hypertension   Slurred speech -Slurred speech and improving, this could be secondary to TIA versus medication effects. -Neurology service is following. -He is on Neurontin, tramadol and Vicodin, this can cause lethargy/AMS. -He doesn't have evidence of infection, clear urine, no shortness of breath or cough and no leukocytosis. -Follow clinically and neurologically.  Diabetes mellitus type 2 -Reported to be control at home, check hemoglobin A1c. -Carbohydrate modified diet and insulin sliding scale. -Patient does have peripheral neuropathy which seemed to be secondary to diabetes. 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  Lethargy -Could be secondary to medications including Neurontin, tramadol and Vicodin. -Has sleep apnea, check ABG. -No evidence of infection, MRI is negative for intracranial events. Follow clinically.  Hypertension -Continue pressure medications.  Code Status: Full code Family Communication: Plan discussed with patient lives with his bedside. Disposition Plan: Inpatient, telemetry, anticipate length of stay greater than 2 midnights.  Time spent: 70 minutes  [facility] Pager [phone]  If 7PM-7AM, please contact night-coverage [email or web address] Password [facility] 12/21/2012, 2:16 PM

## 2012-12-21 NOTE — ED Notes (Signed)
Here via ems from work  States he started feeling weak and dizzy

## 2012-12-21 NOTE — ED Notes (Signed)
Pt transported by [place] [facility] to [facility] as code stroke  Pt leaving facility now

## 2012-12-21 NOTE — Evaluation (Signed)
Physical Therapy Evaluation Patient Details Name: Marvin Rivas MRN: [id number] DOB: [birth year] Today's Date: 12/21/2012 Time: 4098-1191 PT Time Calculation (min): 21 min  PT Assessment / Plan / Recommendation Clinical Impression  Patient is a 62 yo male admitted with slurred speech.  Patient is at modified independent level with all mobility and gait.  Balance is good with cane.  No acute PT needs identified - patient at baseline.  PT will sign off.    PT Assessment  Patent does not need any further PT services    Follow Up Recommendations  No PT follow up    Does the patient have the potential to tolerate intense rehabilitation      Barriers to Discharge        Equipment Recommendations  None recommended by PT    Recommendations for Other Services     Frequency      Precautions / Restrictions Precautions Precautions: None Restrictions Weight Bearing Restrictions: No   Pertinent Vitals/Pain       Mobility  Bed Mobility Bed Mobility: Not assessed Transfers Transfers: Sit to Stand;Stand to Sit Sit to Stand: 6: Modified independent (Device/Increase time);With upper extremity assist;From bed Stand to Sit: 6: Modified independent (Device/Increase time);With upper extremity assist;To chair/3-in-1;To bed Details for Transfer Assistance: No cues needed Ambulation/Gait Ambulation/Gait Assistance: 6: Modified independent (Device/Increase time) Ambulation Distance (Feet): 260 Feet Assistive device: Straight cane Ambulation/Gait Assistance Details: Proper use of cane with good gait pattern noted.  Good balance during gait. Gait Pattern: Within Functional Limits Gait velocity: Slow gait speed      PT Goals  N/A  Visit Information  Last PT Received On: 12/21/12 Assistance Needed: +1    Subjective Data  Subjective: "I was having trouble with my speech and vision.  It's better now." Patient Stated Goal: To go home.   Prior Functioning  Home Living Lives With:  Spouse Available Help at Discharge: Family;Available 24 hours/day Type of Home: House Home Access: Stairs to enter [employer] of Steps: 2 Entrance Stairs-Rails: None Home Layout: One level Bathroom Shower/Tub: Health visitor: Standard Home Adaptive Equipment: Paediatric nurse with back;Straight cane Prior Function Level of Independence: Independent with assistive device(s) Able to Take Stairs?: Yes (with difficulty - getting ramp put in) Driving: Yes (Uses hand controls due to neuropathy RLE) Vocation: Full time employment Communication Communication: No difficulties    Cognition  Cognition Overall Cognitive Status: Appears within functional limits for tasks assessed/performed Arousal/Alertness: Awake/alert Orientation Level: Oriented X4 / Intact Behavior During Session: [facility] for tasks performed    Extremity/Trunk Assessment Right Upper Extremity Assessment RUE ROM/Strength/Tone: WFL for tasks assessed RUE Sensation: WFL - Light Touch Left Upper Extremity Assessment LUE ROM/Strength/Tone: WFL for tasks assessed LUE Sensation: WFL - Light Touch Right Lower Extremity Assessment RLE ROM/Strength/Tone: WFL for tasks assessed RLE Sensation: History of peripheral neuropathy RLE Coordination: Deficits RLE Coordination Deficits: Decreased gross coordination with heel-to-shin Left Lower Extremity Assessment LLE ROM/Strength/Tone: WFL for tasks assessed LLE Sensation: History of peripheral neuropathy LLE Coordination: Deficits LLE Coordination Deficits: Decreased gross coordination with heel-to-shin   Balance Balance Balance Assessed: Yes High Level Balance High Level Balance Activites: Direction changes;Turns;Sudden stops;Head turns (Walking around obstacles) High Level Balance Comments: No loss of balance noted with high level balance activities  End of Session PT - End of Session Equipment Utilized During Treatment: Gait belt Activity Tolerance: Patient  tolerated treatment well Patient left: in bed;with call bell/phone within reach (sitting at EOB) Nurse Communication: Mobility status  GP     Vena Austria 12/21/2012, 7:34 PM Durenda Hurt. Renaldo Fiddler, [facility] Acute Rehab Services Pager [phone]

## 2012-12-21 NOTE — ED Notes (Signed)
Patient transported to CT 

## 2012-12-21 NOTE — ED Provider Notes (Signed)
  Physical Exam  BP 135/62  Pulse 81  Temp(Src) 98.1 F (36.7 C) (Oral)  Resp 16  Ht 5\' 9"  (1.753 m)  Wt 330 lb (149.687 kg)  BMI 48.71 kg/m2  SpO2 99%  Physical Exam  ED Course  Procedures  MDM Patient has been seen and evaluated, vital signs are normal, he has been seen by the neurologist, he will be admitted to the hospitalist service, discussed with Dr. Arthor Captain who requests a telemetry bed.      Vida Roller, MD 12/21/12 [phone]

## 2012-12-21 NOTE — ED Notes (Signed)
MD at bedside. Neurology at bedside

## 2012-12-21 NOTE — ED Notes (Signed)
Hospitalist at bedside 

## 2012-12-21 NOTE — ED Notes (Signed)
Patient transported to MRI 

## 2012-12-21 NOTE — Consult Note (Signed)
Reason for Consult:Stroke Referring Physician: Preston Fleeting, D  CC: Unsteadiness  History is obtained from:Patient  HPI: Marvin Rivas is a 62 y.o. male who was normla when he went to work this morning and then around 6:30 began noticing that he was unsteady, was slurring his words, and had blurred vision. He reports that things are rapidly imrpoving, though he still had some slurred speech when I saw him. He describes the vision as being "like I just can't make out what I am trying to see."    LKW: 6 am.  tpa given: no, rapidly improvign symptoms.     ROS: A 14 point ROS was performed and is negative except as noted in the HPI.  Past Medical History  Diagnosis Date  . Diabetes mellitus without complication   . Hypertension     Family History: Grandfather - strkoe  Social History: Tob: none  Exam: Current vital signs: BP 135/62  Pulse 81  Temp(Src) 98.1 F (36.7 C) (Oral)  Resp 16  Ht 5\' 9"  (1.753 m)  Wt 149.687 kg (330 lb)  BMI 48.71 kg/m2  SpO2 99% Vital signs in last 24 hours: Temp:  [97.6 F (36.4 C)-98.1 F (36.7 C)] 98.1 F (36.7 C) (02/21 0925) Pulse Rate:  [81-87] 81 (02/21 1025) Resp:  [16-20] 16 (02/21 1025) BP: (135-144)/(62) 135/62 mmHg (02/21 1025) SpO2:  [97 %-99 %] 99 % (02/21 1025) Weight:  [149.687 kg (330 lb)] 149.687 kg (330 lb) (02/21 0905)  General: in bed, nad CV: RRR Mental Status: Patient is awake, alert, oriented to person, place, month, year, and situation. Immediate and remote memory are intact. Patient is able to give a clear and coherent history. Speech is very slightly slurred.  Cranial Nerves: II: Visual Fields are full. Pupils are equal, round, and reactive to light.  Discs are sharp. III,IV, VI: EOMI without ptosis or diploplia.  V: Facial sensation is symmetric to temperature VII: Facial movement is symmetric.  VIII: hearing is intact to voice X: Uvula elevates symmetrically XI: Shoulder shrug is symmetric. XII: tongue is  midline without atrophy or fasciculations.  Motor: Tone is normal. Bulk is normal. 5/5 strength was present in all four extremities.  Sensory: Sensation is symmetric to light touch and temperature in the arms and legs with the exception of the medial right leg(old deficit) Deep Tendon Reflexes: 2+ and symmetric in the biceps and patellae.  Plantars: Toes are downgoing bilaterally.  Cerebellar: FNF and HKS are intact bilaterally Gait: Not assessed due to acute nature of evaluation and multiple medical monitors in ICU setting.  I have reviewed labs in epic and the results pertinent to this consultation are: CMP - high glucose CBC WNL  I have reviewed the images obtained:MRI brain - no infarct.   Impression: 62 yo M with sudden onset blurred vision, difficulty walking and dysarthria. Combined, this is most consistent with a posterior circulation TIA. With his multiple risk factors I would favor treating this as a TIA.   Recommendations: 1. HgbA1c, fasting lipid panel 2. MRI, MRA  of the brain without contrast 3. Frequent neuro checks 4. Echocardiogram 5. Carotid dopplers 6. Prophylactic therapy-aspirin.   7. Risk factor modification 8. Telemetry monitoring   Ritta Slot, MD [facility] [phone]  If 7pm- 7am, please page neurology on call at [phone].

## 2012-12-21 NOTE — ED Notes (Signed)
Ct called and informed of code stroke status.

## 2012-12-21 NOTE — ED Provider Notes (Signed)
History     CSN: [id number]  Arrival date & time 12/21/12  0902   First MD Initiated Contact with Patient 12/21/12 [phone]      Chief Complaint  Patient presents with  . Dizziness    (Consider location/radiation/quality/duration/timing/severity/associated sxs/prior treatment) The history is provided by the patient.   62 year old male states that he noticed he was more tired than normal this morning-like he hadn't actually slept. He drove to work and states that before leaving home he talked with his wife and everything was normal. When he arrived at work at about 6 AM, his speech and gait were normal. At about 7 AM, he spoke with coworkers he noticed that his speech was slurred and he also noticed that the computer screen was slurred. He also noticed that he was off balance and he fell while walking. He does complain of feeling generally dizzy. He did not have any difficulty using his arms or hands. He denies headache, chest pain, nausea, vomiting. Of note, he has severe diabetic peripheral neuropathy affecting his feet to the extent that he has to use hand controls in his car in order to drive.  Past Medical History  Diagnosis Date  . Diabetes mellitus without complication   . Hypertension     History reviewed. No pertinent past surgical history.  No family history on file.  History  Substance Use Topics  . Smoking status: Not on file  . Smokeless tobacco: Not on file  . Alcohol Use: Not on file      Review of Systems  All other systems reviewed and are negative.    Allergies  Review of patient's allergies indicates no known allergies.  Home Medications   Current Outpatient Rx  Name  Route  Sig  Dispense  Refill  . amLODipine-benazepril (LOTREL) 10-20 MG per capsule   Oral   Take 1 capsule by mouth daily.         . finasteride (PROSCAR) 5 MG tablet   Oral   Take 5 mg by mouth daily.         Marland Kitchen gabapentin (NEURONTIN) 600 MG tablet   Oral   Take 600 mg by  mouth 3 (three) times daily.         Marland Kitchen HYDROcodone-acetaminophen (NORCO/VICODIN) 5-325 MG per tablet   Oral   Take 1 tablet by mouth every 6 (six) hours as needed for pain.         Marland Kitchen losartan-hydrochlorothiazide (HYZAAR) 50-12.5 MG per tablet   Oral   Take 1 tablet by mouth daily.         . Tamsulosin HCl (FLOMAX) 0.4 MG CAPS   Oral   Take 0.4 mg by mouth.         . traMADol (ULTRAM) 50 MG tablet   Oral   Take 50 mg by mouth every 6 (six) hours as needed for pain.           BP 144/62  Pulse 87  Temp(Src) 97.6 F (36.4 C) (Oral)  Resp 20  Ht 5\' 9"  (1.753 m)  Wt 330 lb (149.687 kg)  BMI 48.71 kg/m2  SpO2 97%  Physical Exam  Nursing note and vitals reviewed.  Morbidly obese 62 year old male, resting comfortably and in no acute distress. Vital signs are significant for hypertension with blood pressure 144/62. Oxygen saturation is 97%, which is normal. Head is normocephalic and atraumatic. PERRLA, EOMI. Oropharynx is clear. Right ptosis is present, the patient states that that is normal  for him. Neck is nontender and supple without adenopathy or JVD. There are no carotid bruits. Back is nontender and there is no CVA tenderness. Lungs are clear without rales, wheezes, or rhonchi. Chest is nontender. Heart has regular rate and rhythm without murmur. Abdomen is soft, flat, nontender without masses or hepatosplenomegaly and peristalsis is normoactive. Extremities have1+ edema, full range of motion is present. Skin is warm and dry without rash. Neurologic: Speech is dysarthric. There is no facial droop and tongue protrudes in the midline. I cannot detect any visual field cut. He has leg weakness which is 4/5 and slightly worse on the right than on the left. There is no pronator drift. Arm strength is 5/5. Finger to nose testing is normal. Formal Romberg testing was not able to be done, but on sitting he is generally unsteady without any tendency to fall in one direction or  another.  ED Course  Procedures (including critical care time)  Results for orders placed during the hospital encounter of 12/21/12  CBC      Result Value Range   WBC 7.1  4.0 - 10.5 K/uL   RBC 4.66  4.22 - 5.81 MIL/uL   Hemoglobin 14.3  13.0 - 17.0 g/dL   HCT 86.5  78.4 - 69.6 %   MCV 89.1  78.0 - 100.0 fL   MCH 30.7  26.0 - 34.0 pg   MCHC 34.5  30.0 - 36.0 g/dL   RDW 29.5  28.4 - 13.2 %   Platelets 252  150 - 400 K/uL  DIFFERENTIAL      Result Value Range   Neutrophils Relative 55  43 - 77 %   Neutro Abs 3.9  1.7 - 7.7 K/uL   Lymphocytes Relative 33  12 - 46 %   Lymphs Abs 2.4  0.7 - 4.0 K/uL   Monocytes Relative 6  3 - 12 %   Monocytes Absolute 0.4  0.1 - 1.0 K/uL   Eosinophils Relative 5  0 - 5 %   Eosinophils Absolute 0.3  0.0 - 0.7 K/uL   Basophils Relative 0  0 - 1 %   Basophils Absolute 0.0  0.0 - 0.1 K/uL  GLUCOSE, CAPILLARY      Result Value Range   Glucose-Capillary 164 (*) 70 - 99 mg/dL   Ct Head Wo Contrast  12/21/2012  *RADIOLOGY REPORT*  Clinical Data: Dizziness.  Diabetes and hypertension history.  CT HEAD WITHOUT CONTRAST  Technique:  Contiguous axial images were obtained from the base of the skull through the vertex without contrast.  Comparison: 07/19/2010.  Findings: No mass lesion, mass effect, midline shift, hydrocephalus, hemorrhage.  No territorial ischemia or acute infarction.  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 Paranasal sinuses are within normal limits.  IMPRESSION: Negative CT head.  Critical Value/emergent results were called by telephone at the time of interpretation on 12/21/2012 at 0939 hours to Dr. Preston Fleeting, who verbally acknowledged these results.   Original Report Authenticated By: Andreas Newport, M.D.     Images viewed by me.   Date: 12/21/2012  Rate: 78  Rhythm: normal sinus rhythm  QRS Axis: normal  Intervals: normal  ST/T Wave abnormalities: normal  Conduction Disutrbances:right bundle branch block  Narrative  Interpretation: Right bundle branch block. No prior ECG available for comparison.  Old EKG Reviewed: none available    1. Stroke    CRITICAL CARE Performed by: Dione Booze   Total critical care time: 45 minutes  Critical care time was exclusive  of separately billable procedures and treating other patients.  Critical care was necessary to treat or prevent imminent or life-threatening deterioration.  Critical care was time spent personally by me on the following activities: development of treatment plan with patient and/or surrogate as well as nursing, discussions with consultants, evaluation of patient's response to treatment, examination of patient, obtaining history from patient or surrogate, ordering and performing treatments and interventions, ordering and review of laboratory studies, ordering and review of radiographic studies, pulse oximetry and re-evaluation of patient's condition.   MDM  Apparent stroke with last known normal at 6 AM. NIH stroke scale seems to be at 2. I have discussed case with Dr. Petra Kuba of the neuro hospitalists who agrees to accept the patient in transfer. He is also discussed with Dr. Hyacinth Meeker, ED physician, who also took the patient in transfer. He will be sent to the [facility].        Dione Booze, MD 12/21/12 [phone]

## 2012-12-21 NOTE — ED Notes (Signed)
(  11 has been called and [facility] notified

## 2012-12-21 NOTE — ED Notes (Signed)
Labs states all tubes except cbc are hemolyzed. Labs redrawn from left a/c using butterfly, pt tolerated well.

## 2012-12-22 DIAGNOSIS — R4789 Other speech disturbances: Secondary | ICD-10-CM

## 2012-12-22 LAB — LIPID PANEL
Cholesterol: 130 mg/dL (ref 0–200)
HDL: 26 mg/dL — ABNORMAL LOW (ref >39)
LDL Cholesterol: 63 mg/dL (ref 0–99)
Total CHOL/HDL Ratio: 5 RATIO
Triglycerides: 204 mg/dL — ABNORMAL HIGH (ref <150)
VLDL: 41 mg/dL — ABNORMAL HIGH (ref 0–40)

## 2012-12-22 LAB — CBC
HCT: 38.8 % — ABNORMAL LOW (ref 39.0–52.0)
Hemoglobin: 13.5 g/dL (ref 13.0–17.0)
MCH: 30.9 pg (ref 26.0–34.0)
MCHC: 34.8 g/dL (ref 30.0–36.0)
MCV: 88.8 fL (ref 78.0–100.0)
Platelets: 219 10*3/uL (ref 150–400)
RBC: 4.37 MIL/uL (ref 4.22–5.81)
RDW: 13 % (ref 11.5–15.5)
WBC: 7.2 10*3/uL (ref 4.0–10.5)

## 2012-12-22 LAB — GLUCOSE, CAPILLARY
Glucose-Capillary: 118 mg/dL — ABNORMAL HIGH (ref 70–99)
Glucose-Capillary: 111 mg/dL — ABNORMAL HIGH (ref 70–99)
Glucose-Capillary: 125 mg/dL — ABNORMAL HIGH (ref 70–99)
Glucose-Capillary: 122 mg/dL — ABNORMAL HIGH (ref 70–99)

## 2012-12-22 LAB — TROPONIN I: Troponin I: <0.3 ng/mL (ref <0.30)

## 2012-12-22 LAB — BASIC METABOLIC PANEL
BUN: 18 mg/dL (ref 6–23)
Chloride: 105 mEq/L (ref 96–112)
GFR calc Af Amer: 89 mL/min — ABNORMAL LOW (ref >90)
GFR calc non Af Amer: 77 mL/min — ABNORMAL LOW (ref >90)
Potassium: 3.8 mEq/L (ref 3.5–5.1)
Sodium: 142 mEq/L (ref 135–145)

## 2012-12-22 MED ORDER — AMLODIPINE BESY-BENAZEPRIL HCL 10-20 MG PO CAPS: 1.0000 | ORAL_CAPSULE | Freq: Every day | ORAL | Status: DC

## 2012-12-22 MED ORDER — TRAMADOL HCL 50 MG PO TABS
50.0000 mg | ORAL_TABLET | Freq: Four times a day (QID) | ORAL | Status: DC | PRN
Start: 2012-12-22 — End: 2012-12-23

## 2012-12-22 MED ORDER — LOSARTAN POTASSIUM 50 MG PO TABS
100.0000 mg | ORAL_TABLET | Freq: Every day | ORAL | Status: DC
  Administered 2012-12-23: 100 mg via ORAL
  Filled 2012-12-22 (×2): qty 2

## 2012-12-22 MED ORDER — HYDROCHLOROTHIAZIDE 12.5 MG PO CAPS
12.5000 mg | ORAL_CAPSULE | Freq: Every day | ORAL | Status: DC
  Administered 2012-12-23: 12.5 mg via ORAL
  Filled 2012-12-22 (×2): qty 1

## 2012-12-22 MED ORDER — BENAZEPRIL HCL 20 MG PO TABS
20.0000 mg | ORAL_TABLET | Freq: Every day | ORAL | Status: DC
  Administered 2012-12-23: 20 mg via ORAL
  Filled 2012-12-22 (×2): qty 1

## 2012-12-22 MED ORDER — LOSARTAN POTASSIUM-HCTZ 100-12.5 MG PO TABS: 1.0000 | ORAL_TABLET | Freq: Every day | ORAL | Status: DC

## 2012-12-22 MED ORDER — GABAPENTIN 600 MG PO TABS
600.0000 mg | ORAL_TABLET | Freq: Three times a day (TID) | ORAL | Status: DC
  Administered 2012-12-22 – 2012-12-23 (×3): 600 mg via ORAL
  Filled 2012-12-22 (×5): qty 1

## 2012-12-22 MED ORDER — HYDROCODONE-ACETAMINOPHEN 10-325 MG PO TABS: 1.0000 | ORAL_TABLET | Freq: Four times a day (QID) | ORAL | Status: DC | PRN

## 2012-12-22 MED ORDER — AMLODIPINE BESYLATE 10 MG PO TABS
10.0000 mg | ORAL_TABLET | Freq: Every day | ORAL | Status: DC
  Administered 2012-12-23: 10 mg via ORAL
  Filled 2012-12-22 (×2): qty 1

## 2012-12-22 NOTE — Progress Notes (Signed)
Bilateral:  No evidence of hemodynamically significant internal carotid artery stenosis.   Vertebral artery flow is antegrade.     

## 2012-12-22 NOTE — Progress Notes (Signed)
Attempted to get in touch with [facility], to get pt test to be done but to no avail. Will follow up again later.

## 2012-12-22 NOTE — Evaluation (Signed)
Speech Language Pathology Evaluation Patient Details Name: Marvin Rivas MRN: [id number] DOB: 1951-10-09 Today's Date: 12/22/2012 Time: 1030-1050 SLP Time Calculation (min): 20 min  Problem List:  Patient Active Problem List  Diagnosis  . Slurred speech  . Lethargy  . BPH (benign prostatic hyperplasia)  . Diabetes mellitus without complication  . Hypertension   Past Medical History:  Past Medical History  Diagnosis Date  . Diabetes mellitus without complication   . Hypertension   . Neuropathy    Past Surgical History: History reviewed. No pertinent past surgical history. HPI:  Marvin Rivas is a 62 y.o. male with past medical history of diabetes mellitus and hypertension came into the hospital because of slurred speech. Patient said when he went to work about 5:45 AM, he starts reading his e-mails and he couldn't read clearly, he feels he might told some saliva on his right side. Later he wanted to go home, so he called someone and he was being told he had some slurred speech. EMS was called and he was transferred to [facility], his blood sugar was okay, denies any fever or chills, denies any chest pain or palpitations. He was transferred to [facility] as code stroke initially, the neurologist deferred doing TPA because patient improved. CT scan was negative, MRI of the head was done later and showed no evidence of a stroke.  Patient referred for Cognitive Linguistic Evaluation per stroke protocol.    Assessment / Plan / Recommendation Clinical Impression  Cognitive Linguistic skills judged baseline.  Patient does not need any further Speech Language Pathology Services as no deficits noted during evaluation.  ST to sign off as education complete.     SLP Assessment  Patient does not need any further Speech Lanaguage Pathology Services    Follow Up Recommendations  None          SLP Evaluation Prior Functioning  Cognitive/Linguistic Baseline: Within  functional limits Type of Home: House Lives With: Spouse Available Help at Discharge: Available 24 hours/day;Family Education: 12 th grade  Vocation: Full time employment   Cognition  Overall Cognitive Status: Appears within functional limits for tasks assessed Arousal/Alertness: Awake/alert Orientation Level: Oriented X4    Comprehension  Auditory Comprehension Overall Auditory Comprehension: Appears within functional limits for tasks assessed    Expression Expression Primary Mode of Expression: Verbal Verbal Expression Overall Verbal Expression: Appears within functional limits for tasks assessed   Oral / Motor Oral Motor/Sensory Function Overall Oral Motor/Sensory Function: Appears within functional limits for tasks assessed Motor Speech Overall Motor Speech: Appears within functional limits for tasks assessed   GO    Moreen Fowler MS, CCC-SLP [phone] [facility] 12/22/2012, 11:20 AM

## 2012-12-22 NOTE — Progress Notes (Signed)
PROGRESS NOTE  Marvin APPLING Rivas:[id number] DOB: 01-23-1951 DOA: 12/21/2012 PCP: Thora Lance, MD  Brief narrative: 62 yr old male admitted 2.21.14 with slurred speech, admitted to the [facility] initally as a Code Stroke, initial NIHSS was apparently 0, TPA was withheld given rapidly improving mental status   Past medical history-As per Problem list Chart reviewed as below- Admission 9.17.08 for disk herniation and revision laminectomy Laminectomy 9.5.07  Consultants:  Neurology  Procedures:  MRI brain 2.21.14  Antibiotics:  none   Subjective  Alert, pleasant sitting at bedside with wife Really wishes to go home NO new c/o-deneis cp/n/v/sob   Objective    Interim History: nil  Telemetry: nad  Objective: Filed Vitals:   12/22/12 0200 12/22/12 0711 12/22/12 0900 12/22/12 1048  BP: 112/48 116/62 91/79 134/70  Pulse: 65 58 72 67  Temp: 97.6 F (36.4 C) 97.8 F (36.6 C) 97.7 F (36.5 C)   TempSrc: Oral Oral    Resp: 19 20 20    Height:      Weight:      SpO2: 99% 100% 98%     Intake/Output Summary (Last 24 hours) at 12/22/12 1058 Last data filed at 12/22/12 5784  Gross per 24 hour  Intake    120 ml  Output      3 ml  Net    117 ml    Exam:  General: alert, pleasant oreitned in NAD  Cardiovascular:  s1 s 2no m/r/g Respiratory:  Clear, no added sound  Abdomen: soft, NT/ND Skin no edema Neuro Neurologic: Mental status: Alert, oriented, thought content appropriate Cranial nerves: II: visual acuity normal bilaterally, II: visual field normal, V: facial light touch sensation normal bilaterally, VII: upper facial muscle function normal bilaterally, VII: lower facial muscle function normal bilaterally, IX: soft palate elevation normal in midline, IX,X: gag reflex present, XI: sternocleidomastoid strength normal bilaterally Motor: grossly normal Reflexes: 2+ and symmetric equivocal   Data Reviewed: Basic Metabolic Panel:  Recent Labs Lab  12/21/12 0940 12/22/12 0330  NA 138 142  K 3.6 3.8  CL 102 105  CO2 25 29  GLUCOSE 195* 129*  BUN 20 18  CREATININE 0.90 1.01  CALCIUM 9.1 8.6   Liver Function Tests:  Recent Labs Lab 12/21/12 0940  AST 16  ALT 18  ALKPHOS 52  BILITOT 0.2*  PROT 6.8  ALBUMIN 3.6   No results found for this basename: LIPASE, AMYLASE,  in the last 168 hours No results found for this basename: AMMONIA,  in the last 168 hours CBC:  Recent Labs Lab 12/21/12 0920 12/22/12 0330  WBC 7.1 7.2  NEUTROABS 3.9  --   HGB 14.3 13.5  HCT 41.5 38.8*  MCV 89.1 88.8  PLT 252 219   Cardiac Enzymes:  Recent Labs Lab 12/21/12 0940 12/21/12 1707 12/21/12 2214 12/22/12 0330  TROPONINI <0.30 <0.30 <0.30 <0.30   BNP: No components found with this basename: POCBNP,  CBG:  Recent Labs Lab 12/21/12 0938 12/21/12 2126 12/22/12 0714  GLUCAP 164* 118* 111*    No results found for this or any previous visit (from the past 240 hour(s)).   Studies:              All Imaging reviewed and is as per above notation   Scheduled Meds: . finasteride  5 mg Oral Daily  . heparin  5,000 Units Subcutaneous Q8H  . losartan  100 mg Oral Daily   And  . hydrochlorothiazide  12.5 mg Oral  Daily  . insulin aspart  0-9 Units Subcutaneous TID WC  . Point Trial - clopidogrel / placebo (daily dose)  (PI-Sethi)  75 mg Oral Q breakfast  . Tamsulosin HCl  0.4 mg Oral Daily   Continuous Infusions: . sodium chloride 75 mL/hr at 12/22/12 0520     Assessment/Plan: 1. ? TIA- Will complete work-up for CVA with ECHO and Carotids.  Is on Point trial per Neuro who will coordinate out-patient medications with Pharmacy 2. DM ty 2 c Neuropathy, Aic 6.2-sugars controlled between 111-164.  Continue sensitive SSI for now,  Card mod diet 3. Htn-Continue Hyzaaar 1 daily, Lotrel 1 daily-moderately controlled 4. LBP-continue Neurontin 600 tid, Norco 10-325 q6 prn mod pain, tramadol 50 tid mild pain 5. OSA_ ha smachine in  room.  COpntinue use 6. Morbid obesity, Body mass index is 48.71 kg/(m^2).-needs outpatient re-eval    Code Status: Full Family Communication: spoke with wife at bedside who understands Disposition Plan: inpatient   Pleas Koch, MD  [facility] Pager [phone] 12/22/2012, 10:58 AM    LOS: 1 day

## 2012-12-22 NOTE — Progress Notes (Signed)
Stroke Team Progress Note  HISTORY Marvin Rivas is a 62 y.o. male who was normal when he went to work on the morning of 12/21/12  and then around 6:30 began noticing that he was unsteady, was slurring his words, and had blurred vision. He reported that things were rapidly imrpoving, though he still had some slurred speech when Dr. Amada Jupiter saw him. He described his vision as being "like I just can't make out what I am trying to see."   LKW: 6 am. 04/20/13  TPA not given due to rapidly improving symptoms.   SUBJECTIVE The patient is without complaints. His wife is present in the room this morning. They feel he is back to baseline.   OBJECTIVE Most recent Vital Signs: Filed Vitals:   12/21/12 1821 12/21/12 2200 12/22/12 0200 12/22/12 0711  BP: 112/50 118/50 112/48 116/62  Pulse: 73 70 65 58  Temp: 97.8 F (36.6 C) 97.8 F (36.6 C) 97.6 F (36.4 C) 97.8 F (36.6 C)  TempSrc: Oral Oral Oral Oral  Resp: 18 19 19 20   Height:      Weight:      SpO2: 97% 99% 99% 100%   CBG (last 3)   Recent Labs  12/21/12 0938 12/21/12 2126 12/22/12 0714  GLUCAP 164* 118* 111*    IV Fluid Intake:   . sodium chloride 75 mL/hr at 12/22/12 0520    MEDICATIONS  . finasteride  5 mg Oral Daily  . heparin  5,000 Units Subcutaneous Q8H  . losartan  100 mg Oral Daily   And  . hydrochlorothiazide  12.5 mg Oral Daily  . insulin aspart  0-9 Units Subcutaneous TID WC  . Point Trial - clopidogrel / placebo (daily dose)  (PI-Sethi)  75 mg Oral Q breakfast  . Tamsulosin HCl  0.4 mg Oral Daily   PRN:  acetaminophen, acetaminophen, alum & mag hydroxide-simeth, ondansetron (ZOFRAN) IV, ondansetron  Diet: Carb modified with thin liquids. Activity: Up with assistance DVT Prophylaxis:  SCDs and subcutaneous heparin  CLINICALLY SIGNIFICANT STUDIES Basic Metabolic Panel:  Recent Labs Lab 12/21/12 0940 12/22/12 0330  NA 138 142  K 3.6 3.8  CL 102 105  CO2 25 29  GLUCOSE 195* 129*  BUN 20 18   CREATININE 0.90 1.01  CALCIUM 9.1 8.6   Liver Function Tests:  Recent Labs Lab 12/21/12 0940  AST 16  ALT 18  ALKPHOS 52  BILITOT 0.2*  PROT 6.8  ALBUMIN 3.6   CBC:  Recent Labs Lab 12/21/12 0920 12/22/12 0330  WBC 7.1 7.2  NEUTROABS 3.9  --   HGB 14.3 13.5  HCT 41.5 38.8*  MCV 89.1 88.8  PLT 252 219   Coagulation:  Recent Labs Lab 12/21/12 0940  LABPROT 11.8  INR 0.87   Cardiac Enzymes:  Recent Labs Lab 12/21/12 1707 12/21/12 2214 12/22/12 0330  TROPONINI <0.30 <0.30 <0.30   Urinalysis:  Recent Labs Lab 12/21/12 1040  COLORURINE YELLOW  LABSPEC 1.016  PHURINE 5.0  GLUCOSEU NEGATIVE  HGBUR NEGATIVE  BILIRUBINUR NEGATIVE  KETONESUR NEGATIVE  PROTEINUR NEGATIVE  UROBILINOGEN 0.2  NITRITE NEGATIVE  LEUKOCYTESUR NEGATIVE   Lipid Panel    Component Value Date/Time   CHOL 130 12/22/2012 0330   TRIG 204* 12/22/2012 0330   HDL 26* 12/22/2012 0330   CHOLHDL 5.0 12/22/2012 0330   VLDL 41* 12/22/2012 0330   LDLCALC 63 12/22/2012 0330   HgbA1C  Lab Results  Component Value Date   HGBA1C 6.2* 12/21/2012  Urine Drug Screen:     Component Value Date/Time   LABOPIA NONE DETECTED 12/21/2012 1040   COCAINSCRNUR NONE DETECTED 12/21/2012 1040   LABBENZ NONE DETECTED 12/21/2012 1040   AMPHETMU NONE DETECTED 12/21/2012 1040   THCU NONE DETECTED 12/21/2012 1040   LABBARB NONE DETECTED 12/21/2012 1040    Alcohol Level:  Recent Labs Lab 12/21/12 0940  ETH <11    Ct Head Wo Contrast 12/21/12 IMPRESSION: Negative CT head.     Mr Brain Wo Contrast 12/21/12 IMPRESSION: 1. No acute intracranial abnormality. 2.  Largely unremarkable for age MRI appearance of the brain.      2D Echocardiogram    Carotid Doppler    CXR    EKG  normal sinus rhythm rate 78 beats per minute.  Therapy Recommendations no physical therapy followup recommended  Physical Exam   General - pleasant 62 year old male seated in a chair in no acute distress. Heart - Regular rate and  rhythm - no murmer Lungs - Clear to auscultation Abdomen - Soft - non tender Skin - Warm and dry  NEUROLOGIC:   MENTAL STATUS: awake, alert, Language fluent Follows simple commands. Naming intact   CRANIAL NERVES: pupils equal and reactive to light, visual fields full to confrontation, extraocular muscles intact, no nystagmus, facial sensation and strength symmetric, uvula midline, shoulder shrug symmetric, tongue midline, Corneal reflex,  MOTOR: normal bulk and tone, full strength in the BUE, BLE  SENSORY: normal and symmetric to light touch  COORDINATION: finger-nose-finger normal - heel to shin normal - rapid alternating movements normal   ASSESSMENT [name] is a 62 y.o. male presenting with slurred speech.  TPA was not given due to to rapidly improving symptoms. Imaging confirms no acute abnormality. The patient is on no anticoagulation prior to admission. He is now participating in the PointTrial with Aspirin and either clopidogrel or placebo. Patient with no deficits. Probable TIA. Work up underway.   Hypertension  Dyslipidemia  Diabetes mellitus  Neuropathy  Hospital day # 1  TREATMENT/PLAN  Continue participation in the Point Trial. [facility] hospital pharmacy will provide the patient with the Point Trial medications prior to discharge.  Check 2D echo and carotid dopplers to complete workup.  Consider resuming other home medications.  Delton See PA-C [facility] Pager [phone] 12/22/2012, 9:33 AM  I have personally obtained a history, examined the patient, evaluated imaging results, and formulated the assessment and plan of care. I agree with the above.  Suspect TIA, pt is back to baseline with negative MRI - restart home meds including anti hypertensives - On point trial. Prior to d/c needs medication to be obtained from pharmacy at [facility] - Complete stroke w/up with 2decho and carotid doppler.    Pauletta Browns

## 2012-12-23 ENCOUNTER — Encounter (HOSPITAL_COMMUNITY): Payer: Self-pay | Admitting: *Deleted

## 2012-12-23 LAB — GLUCOSE, CAPILLARY

## 2012-12-23 NOTE — Progress Notes (Signed)
Patient has been d/ced this afternoon, instructions given and dully signed. Assessments remained unchanged prior to discharge.

## 2012-12-23 NOTE — Progress Notes (Signed)
  Echocardiogram 2D Echocardiogram has been performed.  Marvin Rivas 12/23/2012, 10:25 AM

## 2012-12-23 NOTE — Progress Notes (Signed)
Stroke Team Progress Note  HISTORY Marvin Rivas is a 62 y.o. male who was normal when he went to work on the morning of 12/21/12  and then around 6:30 began noticing that he was unsteady, was slurring his words, and had blurred vision. He reported that things were rapidly imrpoving, though he still had some slurred speech when Dr. Amada Jupiter saw him. He described his vision as being "like I just can't make out what I am trying to see."   LKW: 6 am. 04/20/13  TPA not given due to rapidly improving symptoms.   SUBJECTIVE The patient is without complaints. He has had no further speech difficulties or any TIA like symptoms. We discussed the results of his carotid Dopplers. The 2-D echo is still pending at this time. Hopefully the patient will be able to be discharged later today. He will followup with his primary care physician Dr. Bethann Berkshire.   OBJECTIVE Most recent Vital Signs: Filed Vitals:   12/22/12 1431 12/22/12 1719 12/22/12 2135 12/23/12 0713  BP: 121/62 116/58 109/50 123/67  Pulse: 72 75 57 63  Temp: 97.4 F (36.3 C) 98.1 F (36.7 C) 97.6 F (36.4 C) 97.7 F (36.5 C)  TempSrc:   Oral Oral  Resp: 20 20 20 20   Height:      Weight:      SpO2: 98% 96% 99% 98%   CBG (last 3)   Recent Labs  12/22/12 1627 12/22/12 2322 12/23/12 0711  GLUCAP 125* 122* 109*    IV Fluid Intake:      MEDICATIONS  . amLODipine  10 mg Oral Daily   And  . benazepril  20 mg Oral Daily  . finasteride  5 mg Oral Daily  . gabapentin  600 mg Oral TID  . losartan  100 mg Oral Daily   And  . hydrochlorothiazide  12.5 mg Oral Daily  . insulin aspart  0-9 Units Subcutaneous TID WC  . Point Trial - clopidogrel / placebo (daily dose)  (PI-Sethi)  75 mg Oral Q breakfast  . Tamsulosin HCl  0.4 mg Oral Daily   PRN:  acetaminophen, acetaminophen, alum & mag hydroxide-simeth, HYDROcodone-acetaminophen, ondansetron (ZOFRAN) IV, ondansetron, traMADol  Diet: Carb modified with thin liquids. Activity:  Up with assistance DVT Prophylaxis:  SCDs and subcutaneous heparin  CLINICALLY SIGNIFICANT STUDIES Basic Metabolic Panel:   Recent Labs Lab 12/21/12 0940 12/22/12 0330  NA 138 142  K 3.6 3.8  CL 102 105  CO2 25 29  GLUCOSE 195* 129*  BUN 20 18  CREATININE 0.90 1.01  CALCIUM 9.1 8.6   Liver Function Tests:   Recent Labs Lab 12/21/12 0940  AST 16  ALT 18  ALKPHOS 52  BILITOT 0.2*  PROT 6.8  ALBUMIN 3.6   CBC:   Recent Labs Lab 12/21/12 0920 12/22/12 0330  WBC 7.1 7.2  NEUTROABS 3.9  --   HGB 14.3 13.5  HCT 41.5 38.8*  MCV 89.1 88.8  PLT 252 219   Coagulation:   Recent Labs Lab 12/21/12 0940  LABPROT 11.8  INR 0.87   Cardiac Enzymes:   Recent Labs Lab 12/21/12 1707 12/21/12 2214 12/22/12 0330  TROPONINI <0.30 <0.30 <0.30   Urinalysis:   Recent Labs Lab 12/21/12 1040  COLORURINE YELLOW  LABSPEC 1.016  PHURINE 5.0  GLUCOSEU NEGATIVE  HGBUR NEGATIVE  BILIRUBINUR NEGATIVE  KETONESUR NEGATIVE  PROTEINUR NEGATIVE  UROBILINOGEN 0.2  NITRITE NEGATIVE  LEUKOCYTESUR NEGATIVE   Lipid Panel    Component Value Date/Time  CHOL 130 12/22/2012 0330   TRIG 204* 12/22/2012 0330   HDL 26* 12/22/2012 0330   CHOLHDL 5.0 12/22/2012 0330   VLDL 41* 12/22/2012 0330   LDLCALC 63 12/22/2012 0330   HgbA1C  Lab Results  Component Value Date   HGBA1C 6.2* 12/21/2012    Urine Drug Screen:     Component Value Date/Time   LABOPIA NONE DETECTED 12/21/2012 1040   COCAINSCRNUR NONE DETECTED 12/21/2012 1040   LABBENZ NONE DETECTED 12/21/2012 1040   AMPHETMU NONE DETECTED 12/21/2012 1040   THCU NONE DETECTED 12/21/2012 1040   LABBARB NONE DETECTED 12/21/2012 1040    Alcohol Level:   Recent Labs Lab 12/21/12 0940  ETH <11    Ct Head Wo Contrast 12/21/12 IMPRESSION: Negative CT head.     Mr Brain Wo Contrast 12/21/12 IMPRESSION: 1. No acute intracranial abnormality. 2.  Largely unremarkable for age MRI appearance of the brain.      2D Echocardiogram   pending  Carotid Doppler  - preliminary report - Bilateral: No evidence of hemodynamically significant internal carotid artery stenosis. Vertebral artery flow is antegrade.    CXR  - not performed  EKG  normal sinus rhythm rate 78 beats per minute.  Therapy Recommendations no physical therapy followup recommended  Physical Exam   General - pleasant 62 year old male seated in a chair in no acute distress. Heart - Regular rate and rhythm - no murmer Lungs - Clear to auscultation Abdomen - Soft - non tender Skin - Warm and dry  NEUROLOGIC:   MENTAL STATUS: awake, alert, Language fluent Follows simple commands. Naming intact   CRANIAL NERVES: pupils equal and reactive to light, visual fields full to confrontation, extraocular muscles intact, no nystagmus, facial sensation and strength symmetric, uvula midline, shoulder shrug symmetric, tongue midline, Corneal reflex,  MOTOR: normal bulk and tone, full strength in the BUE, BLE  SENSORY: normal and symmetric to light touch  COORDINATION: finger-nose-finger normal - heel to shin normal - rapid alternating movements normal   ASSESSMENT [name] is a 62 y.o. male presenting with slurred speech.  TPA was not given due to to rapidly improving symptoms. Imaging confirms no acute abnormality. The patient is on no anticoagulation prior to admission. He is now participating in the PointTrial with Aspirin and either clopidogrel or placebo. Patient with no deficits. Probable TIA. Work up underway.   Hypertension  Dyslipidemia  Diabetes mellitus  Neuropathy  Hospital day # 2  TREATMENT/PLAN  Continue participation in the Point Trial. [facility] hospital pharmacy will provide the patient with the Point Trial medications prior to discharge.  No evidence of hemodynamically significant internal carotid artery stenosis.  Consider resuming other home medications.  Await results of 2-D echo.  Delton See PA-C [facility] Pager [phone] 12/23/2012, 8:20 AM  I have personally obtained a history, examined the patient, evaluated imaging results, and formulated the assessment and plan of care. I agree with the above.    RINEHULS, DAVID L  - Await 2Decho results, then pt can be d/c - Point trial medications, pharmacy aware - F/up neurology as out pt 3-6 weeks.

## 2012-12-23 NOTE — Discharge Summary (Signed)
Physician Discharge Summary  Demontay Grantham Hasten OZH:[id number] DOB: 1951-05-17 DOA: 12/21/2012  PCP: Thora Lance, MD  Admit date: 12/21/2012 Discharge date: 12/23/2012  Time spent: 35 minutes  Recommendations for Outpatient Follow-up:  1. Patient to be placed on POINT trial per Dr. Vivia Ewing is to be followed as an out-patient 2. Follow-up ECHO done as in-patient please 3. Follow with 1ry MFD in 1-2 weeks for further risk factor stratification 4. Consider low dose metformin implementation in the out-patient  Discharge Diagnoses:  Principal Problem:   Slurred speech Active Problems:   Lethargy   BPH (benign prostatic hyperplasia)   Diabetes mellitus without complication   Hypertension   Discharge Condition: Good  Diet recommendation: Heart healthy low salt  Filed Weights   12/21/12 0905  Weight: 149.687 kg (330 lb)    History of present illness:  62 yr old male admitted 2.21.14 with slurred speech, admitted to the [facility] initally as a Code Stroke, initial NIHSS was apparently 0, TPA was withheld given rapidly improving mental status   Hospital Course:  1. Likely TIA- Will complete work-up for CVA with ECHO-to be follwed as an out-patient.  Carotids were negative for ICA stenosis. Is on Point trial per Neuro who will coordinate out-patient medications with Pharmacy 2. DM ty 2 c Neuropathy, Aic 6.2-sugars controlled between 111-164. Potentially will need out-patient re-evaluation of this and perhaps implementation of Metformin c Lifestyle modifications 3. Htn-Continue Hyzaaar 1 daily, Lotrel 1 daily-moderately controlled 4. LBP-continue Neurontin 600 tid, Norco 10-325 q6 prn mod pain, tramadol 50 tid mild pain 5. OSA- Continue use of CPAP 6. Morbid obesity, Body mass index is 48.71 kg/(m^2).-needs outpatient re-eval 7. BPH-continue Tamsulosin 0.4 mg daily   Consultants:  Neurology  Procedures:  MRI brain 2.21.14  Antibiotics:  none   Discharge Exam: Filed  Vitals:   12/22/12 1431 12/22/12 1719 12/22/12 2135 12/23/12 0713  BP: 121/62 116/58 109/50 123/67  Pulse: 72 75 57 63  Temp: 97.4 F (36.3 C) 98.1 F (36.7 C) 97.6 F (36.4 C) 97.7 F (36.5 C)  TempSrc:   Oral Oral  Resp: 20 20 20 20   Height:      Weight:      SpO2: 98% 96% 99% 98%    General: Alert pleasant oriented, no distress and no focal deficit Cardiovascular: s1 s2 no m/r/g Respiratory: clear  Discharge Instructions     Medication List    TAKE these medications       amLODipine-benazepril 10-20 MG per capsule  Commonly known as:  LOTREL  Take 1 capsule by mouth daily.     finasteride 5 MG tablet  Commonly known as:  PROSCAR  Take 5 mg by mouth daily.     gabapentin 600 MG tablet  Commonly known as:  NEURONTIN  Take 600 mg by mouth 3 (three) times daily.     HYDROcodone-acetaminophen 10-325 MG per tablet  Commonly known as:  NORCO  Take 1 tablet by mouth every 6 (six) hours as needed for pain.     losartan-hydrochlorothiazide 100-12.5 MG per tablet  Commonly known as:  HYZAAR  Take 1 tablet by mouth daily.     OVER THE COUNTER MEDICATION  Place 2 sprays into the nose 5 (five) times daily. [address] Nasal Spray  ([employer] brand)     Tamsulosin HCl 0.4 MG Caps  Commonly known as:  FLOMAX  Take 0.4 mg by mouth daily.     traMADol 50 MG tablet  Commonly known as:  ULTRAM  Take 50 mg by mouth every 6 (six) hours as needed for pain.          The results of significant diagnostics from this hospitalization (including imaging, microbiology, ancillary and laboratory) are listed below for reference.    Significant Diagnostic Studies: Ct Head Wo Contrast  12/21/2012  *RADIOLOGY REPORT*  Clinical Data: Dizziness.  Diabetes and hypertension history.  CT HEAD WITHOUT CONTRAST  Technique:  Contiguous axial images were obtained from the base of the skull through the vertex without contrast.  Comparison: 07/19/2010.  Findings: No mass lesion, mass effect,  midline shift, hydrocephalus, hemorrhage.  No territorial ischemia or acute infarction.  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 Paranasal sinuses are within normal limits.  IMPRESSION: Negative CT head.  Critical Value/emergent results were called by telephone at the time of interpretation on 12/21/2012 at 0939 hours to Dr. Preston Fleeting, who verbally acknowledged these results.   Original Report Authenticated By: Andreas Newport, M.D.    Mr Brain Wo Contrast  12/21/2012  *RADIOLOGY REPORT*  Clinical Data: 62 year old male with slurred speech and 0630 hours.  MRI HEAD WITHOUT CONTRAST  Technique:  Multiplanar, multiecho pulse sequences of the brain and surrounding structures were obtained according to standard protocol without intravenous contrast.  Comparison: Head CT 12/21/2012.  Findings: No restricted diffusion to suggest acute infarction.  Study is mildly degraded by motion artifact despite repeated imaging attempts.  No midline shift, ventriculomegaly, mass effect, evidence of mass lesion, extra-axial collection or acute intracranial hemorrhage. Cervicomedullary junction and pituitary are within normal limits. Chronic micro hemorrhage posterior left temporal lobe (series 7 image 14).  No cortical encephalomalacia.  Wallace Cullens and white matter signal elsewhere is within normal limits. Major intracranial vascular flow voids are preserved, dominant distal right vertebral artery.  Grossly negative visualized cervical spine.  Normal bone marrow signal. Visualized orbit soft tissues are within normal limits.  Visualized paranasal sinuses and mastoids are clear.  Negative scalp soft tissues.  IMPRESSION: 1. No acute intracranial abnormality. 2.  Largely unremarkable for age MRI appearance of the brain.  Study reviewed in person with Dr. Pearlean Brownie at the time of dictation.   Original Report Authenticated By: Erskine Speed, M.D.     Microbiology: No results found for this or any previous visit (from the past 240 hour(s)).    Labs: Basic Metabolic Panel:  Recent Labs Lab 12/21/12 0940 12/22/12 0330  NA 138 142  K 3.6 3.8  CL 102 105  CO2 25 29  GLUCOSE 195* 129*  BUN 20 18  CREATININE 0.90 1.01  CALCIUM 9.1 8.6   Liver Function Tests:  Recent Labs Lab 12/21/12 0940  AST 16  ALT 18  ALKPHOS 52  BILITOT 0.2*  PROT 6.8  ALBUMIN 3.6   No results found for this basename: LIPASE, AMYLASE,  in the last 168 hours No results found for this basename: AMMONIA,  in the last 168 hours CBC:  Recent Labs Lab 12/21/12 0920 12/22/12 0330  WBC 7.1 7.2  NEUTROABS 3.9  --   HGB 14.3 13.5  HCT 41.5 38.8*  MCV 89.1 88.8  PLT 252 219   Cardiac Enzymes:  Recent Labs Lab 12/21/12 0940 12/21/12 1707 12/21/12 2214 12/22/12 0330  TROPONINI <0.30 <0.30 <0.30 <0.30   BNP: BNP (last 3 results) No results found for this basename: PROBNP,  in the last 8760 hours CBG:  Recent Labs Lab 12/22/12 0714 12/22/12 1144 12/22/12 1627 12/22/12 2322 12/23/12 0711  GLUCAP 111* 120*  125* 122* 109*       Signed:  Rhetta Mura  [facility] 12/23/2012, 9:07 AM

## 2012-12-23 NOTE — Progress Notes (Signed)
Utilization review complete 

## 2013-03-20 ENCOUNTER — Ambulatory Visit (INDEPENDENT_AMBULATORY_CARE_PROVIDER_SITE_OTHER): Payer: Self-pay | Admitting: *Deleted

## 2013-03-20 DIAGNOSIS — G459 Transient cerebral ischemic attack, unspecified: Secondary | ICD-10-CM

## 2013-03-20 NOTE — Progress Notes (Signed)
Patient was seen today for the 90 day EOS in the POINT trial. No reported AE's or SAE's since last patient contact. Questionnaires were completed per trial requirements. NIHSS was 0. Dr. Pearlean Brownie performed a brief neuro exam. No deficits were identified. Patient will continue taking a Aspirin 325mg  daily. Study bottle was received from patient with 1 pill left. Patient will follow up with Dr. Pearlean Brownie and primary care physician as needed.

## 2013-07-11 ENCOUNTER — Telehealth: Payer: Self-pay | Admitting: Neurology

## 2013-07-15 MED ORDER — GABAPENTIN 600 MG PO TABS: 600.0000 mg | ORAL_TABLET | Freq: Three times a day (TID) | ORAL | Status: DC

## 2013-07-15 NOTE — Telephone Encounter (Signed)
Patient made and appt.  Rx has been sent.

## 2013-09-05 ENCOUNTER — Other Ambulatory Visit: Payer: Self-pay

## 2014-01-22 IMAGING — CT CT HEAD W/O CM
1 series · 16 of 30 positions shown, 20 images · non-contrast
Comparison: 07/19/2010.

CLINICAL DATA: Dizziness.  Diabetes and hypertension history.

CT HEAD WITHOUT CONTRAST
TECHNIQUE: Contiguous axial images were obtained from the base of
the skull through the vertex without contrast.

[Series 2: head 4.8 h37s · axial · 0.49mm/px · z∈[-152,-12]mm · 16 of 32 slices shown, 20 images]
[im 2/32  brain]
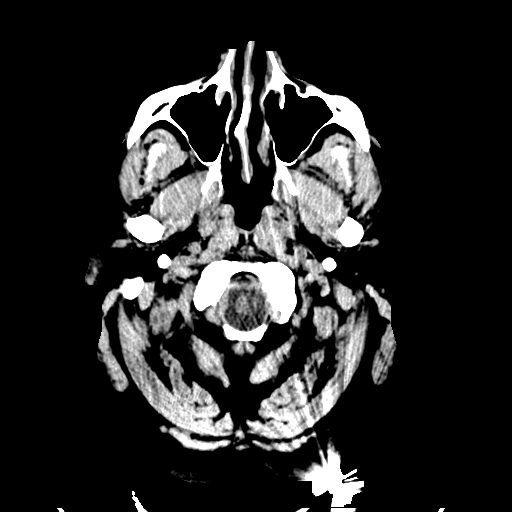
[im 2/32  bone]
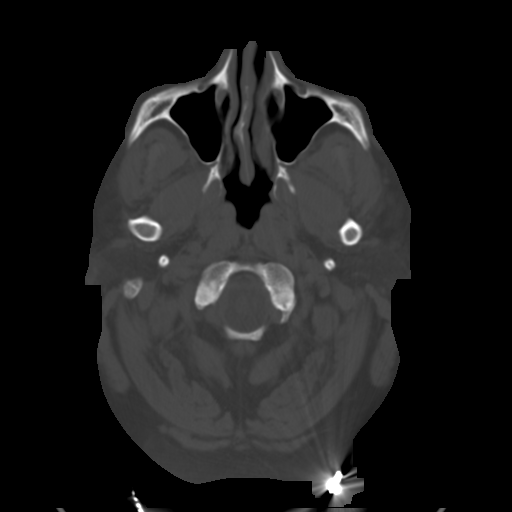
[im 4/32  brain]
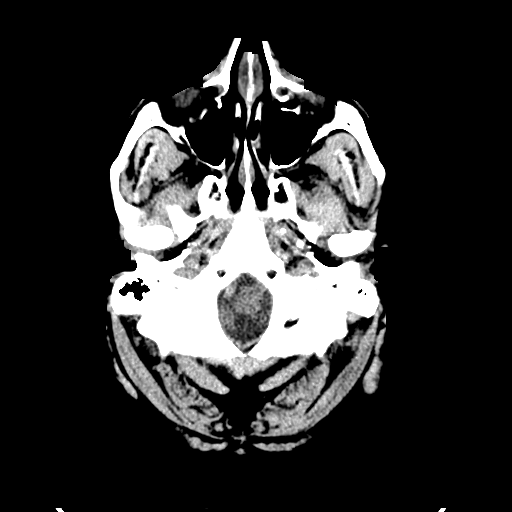
[im 6/32  brain]
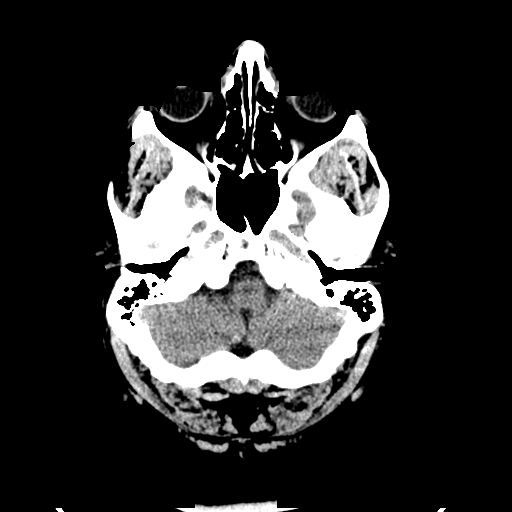
[im 8/32  brain]
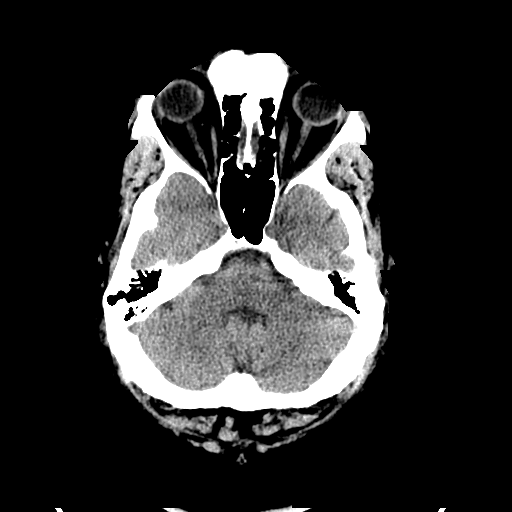
[im 9/32  brain]
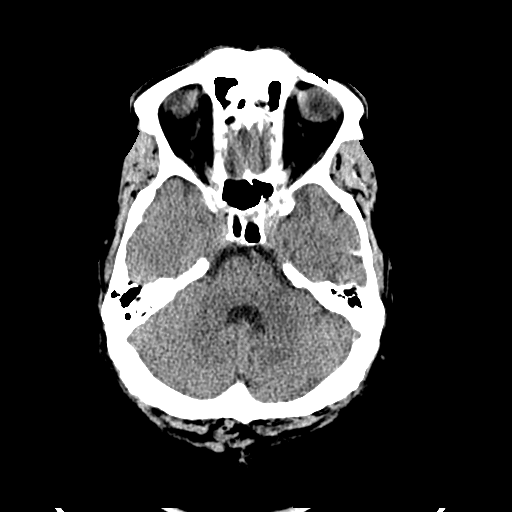
[im 9/32  bone]
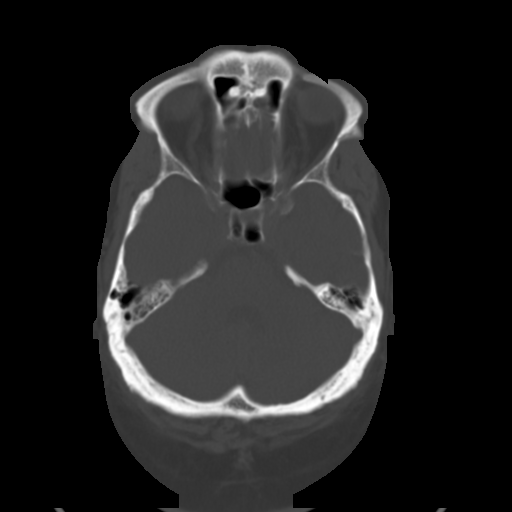
[im 11/32  brain]
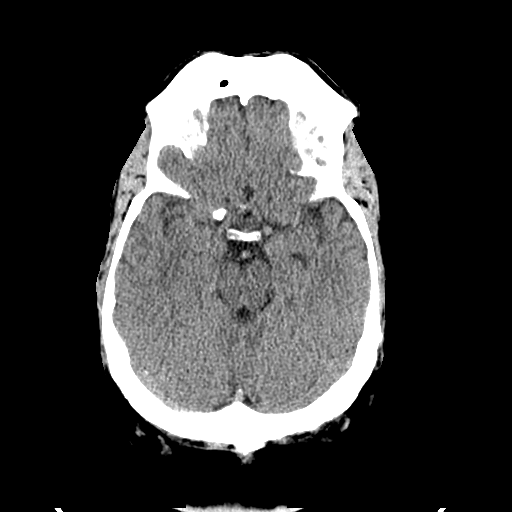
[im 13/32  brain]
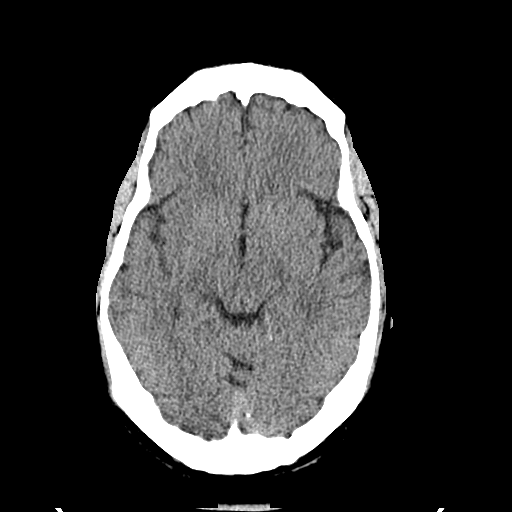
[im 15/32  brain]
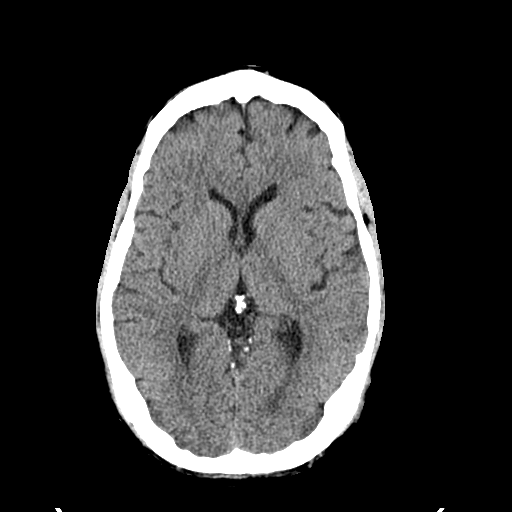
[im 17/32  brain]
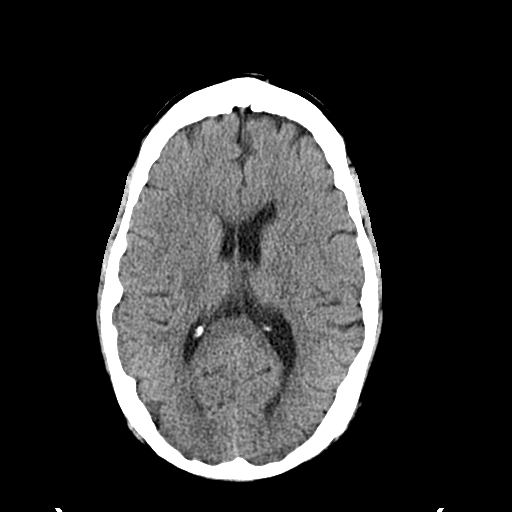
[im 17/32  bone]
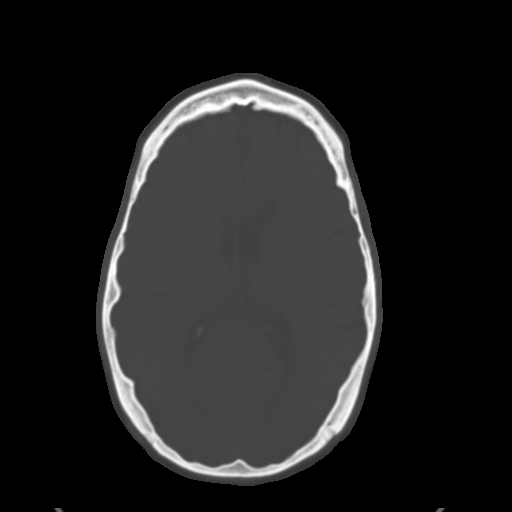
[im 19/32  brain]
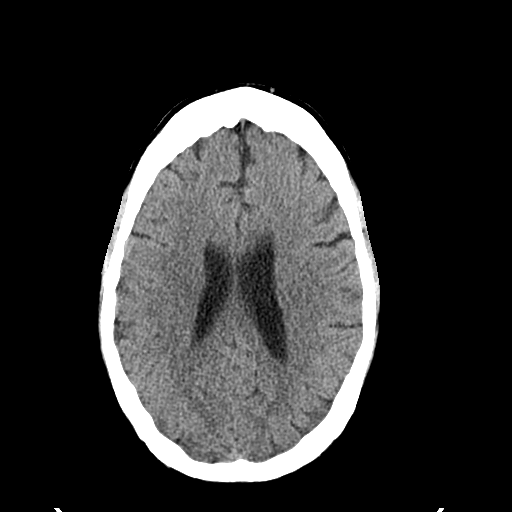
[im 21/32  brain]
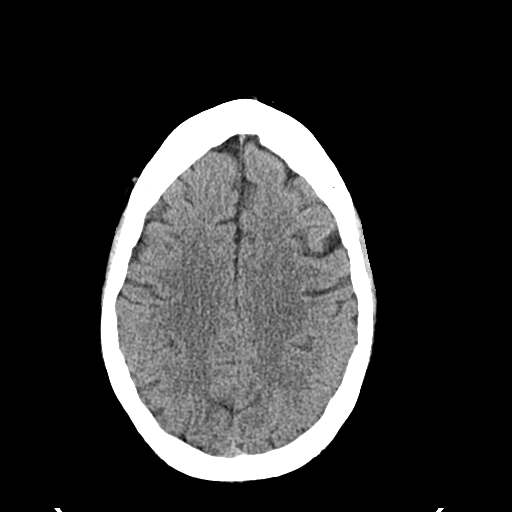
[im 23/32  brain]
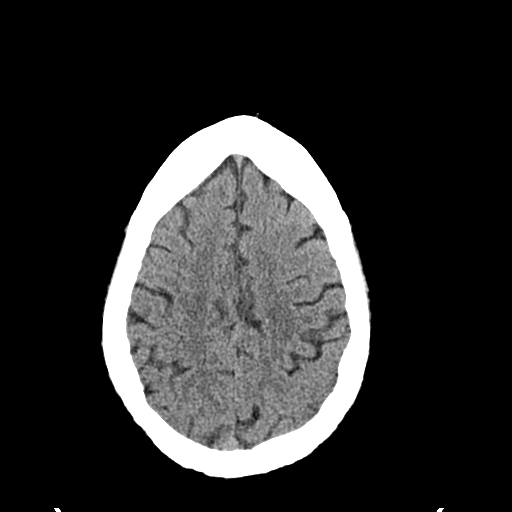
[im 24/32  brain]
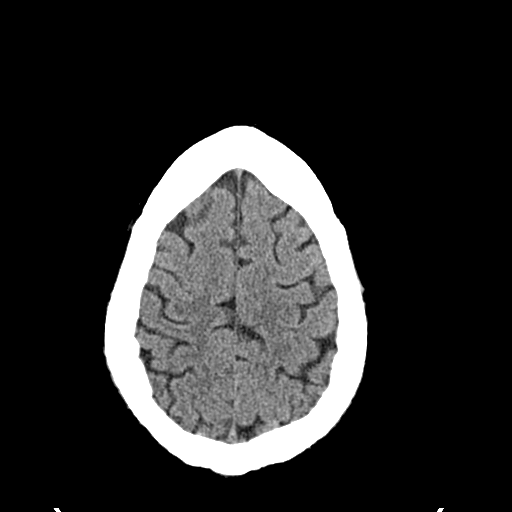
[im 24/32  bone]
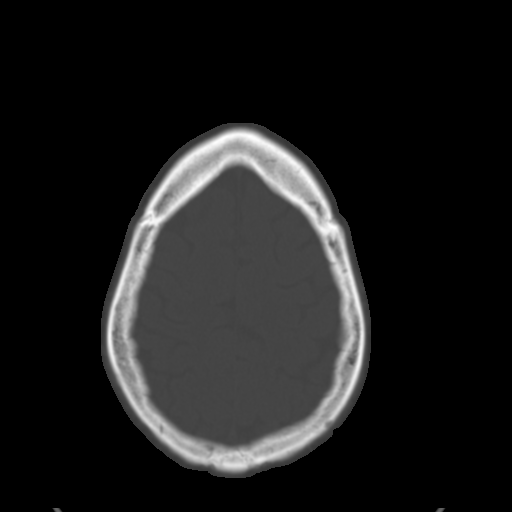
[im 26/32  brain]
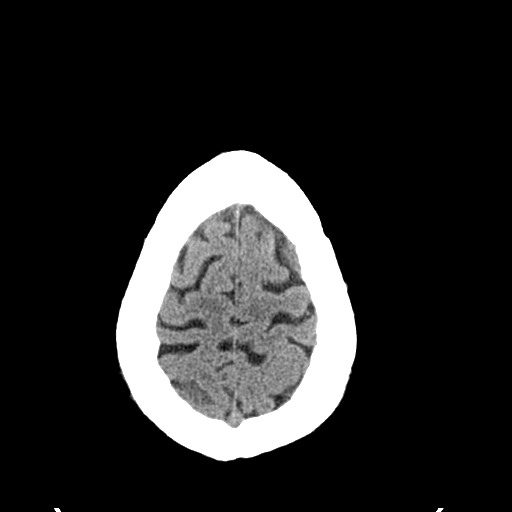
[im 28/32  brain]
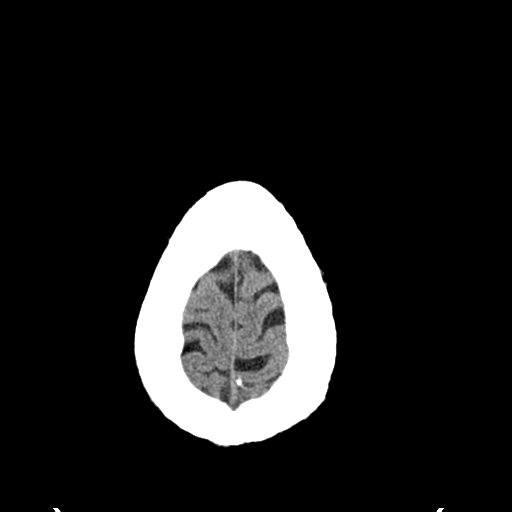
[im 30/32  brain]
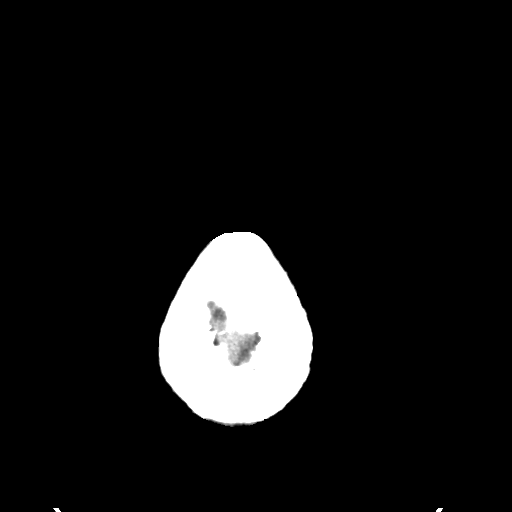

[16 of 30 positions shown; findings below may reference images not displayed]

FINDINGS: No mass lesion, mass effect, midline shift,
hydrocephalus, hemorrhage.  No territorial ischemia or acute
infarction.  Osteoma present as above the right frontal sinus.
Paranasal sinuses are within normal limits.
IMPRESSION: Negative CT head.

Critical Value/emergent results were called by telephone at the
time of interpretation on 12/21/2012 at 9818 hours to Dr. Braiden,
who verbally acknowledged these results.

## 2014-01-23 ENCOUNTER — Encounter: Payer: Self-pay | Admitting: Neurology

## 2014-01-23 ENCOUNTER — Ambulatory Visit (INDEPENDENT_AMBULATORY_CARE_PROVIDER_SITE_OTHER): Payer: BC Managed Care – PPO | Admitting: Neurology

## 2014-01-23 VITALS — BP 126/58 | HR 86 | Ht 68.0 in | Wt 323.0 lb

## 2014-01-23 DIAGNOSIS — E1142 Type 2 diabetes mellitus with diabetic polyneuropathy: Secondary | ICD-10-CM

## 2014-01-23 HISTORY — DX: Type 2 diabetes mellitus with diabetic polyneuropathy: E11.42

## 2014-01-23 NOTE — Patient Instructions (Signed)

## 2014-01-23 NOTE — Progress Notes (Signed)
Reason for visit: Peripheral neuropathy  Marvin Rivas is an 63 y.o. male  History of present illness:  [name] is a 63 year old right-handed white male with a history of diabetes, morbid obesity, and a peripheral neuropathy. The patient has chronic low back pain, and he has had prior lumbosacral spine surgery. The patient has been on gabapentin for his peripheral neuropathy discomfort, and this has been relatively effective. The patient remains on this medication. The patient has mild gait instability, and he uses a cane for ambulation. The patient denies any falls since last seen in 2012. The patient was in the hospital in February of 2014 with a TIA event associated with transient slurred speech. MRI evaluation at that time was negative for an acute stroke. The patient is on CPAP for sleep apnea. The patient reports ongoing problems with his carpal tunnel syndrome, and he wears wrist splints. The patient does not wish to consider surgical measures at this time. The patient indicates that when he walks, he will have discomfort in the feet, right greater than left. The patient does not have pain in the feet with resting or with sitting. 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 The patient returns for an evaluation.  Past Medical History  Diagnosis Date  . Diabetes mellitus without complication   . Hypertension   . Neuropathy   . Polyneuropathy in diabetes(357.2) 01/23/2014  . TIA (transient ischemic attack)   . OSA on CPAP   . Morbid obesity   . Carpal tunnel syndrome on both sides   . Dyslipidemia   . Glaucoma     Past Surgical History  Procedure Laterality Date  . Back surgery    . Spinal fusion    . Nasal septum surgery      Family History  Problem Relation Age of Onset  . Prostate cancer Father   . Hypertension Mother     Social history:  reports that he quit smoking about 15 years ago. He does not have any smokeless  tobacco history on file. He reports that he does not drink alcohol or use illicit drugs.   No Known Allergies  Medications:  Current Outpatient Prescriptions on File Prior to Visit  Medication Sig Dispense Refill  . amLODipine-benazepril (LOTREL) 10-20 MG per capsule Take 1 capsule by mouth daily.      . finasteride (PROSCAR) 5 MG tablet Take 5 mg by mouth daily.      Marland Kitchen gabapentin (NEURONTIN) 600 MG tablet Take 1 tablet (600 mg total) by mouth 3 (three) times daily.  270 tablet  1  . HYDROcodone-acetaminophen (NORCO) 10-325 MG per tablet Take 1 tablet by mouth every 6 (six) hours as needed for pain.      Marland Kitchen losartan-hydrochlorothiazide (HYZAAR) 100-12.5 MG per tablet Take 1 tablet by mouth daily.      Marland Kitchen OVER THE COUNTER MEDICATION Place 2 sprays into the nose 5 (five) times daily. [address] Nasal Spray  ([employer] brand)      . Tamsulosin HCl (FLOMAX) 0.4 MG CAPS Take 0.4 mg by mouth daily.       . traMADol (ULTRAM) 50 MG tablet Take 50 mg by mouth every 6 (six) hours as needed for pain.       No current facility-administered medications on file prior to visit.    ROS:  Out of a complete 14 system review of symptoms, the patient complains only of the following symptoms, and  all other reviewed systems are negative.  Ringing in the ears Light sensitivity Sleep apnea Low back pain, walking difficulties Numbness  Blood pressure 126/58, pulse 86, height 5\' 8"  (1.727 m), weight 323 lb (146.512 kg).  Physical Exam  General: The patient is alert and cooperative at the time of the examination. The patient is morbidly obese.  Skin: No significant peripheral edema is noted.   Neurologic Exam  Mental status: The patient is oriented x 3.  Cranial nerves: Facial symmetry is present. Speech is normal, no aphasia or dysarthria is noted. Extraocular movements are full. Visual fields are full.  Motor: The patient has good strength in all 4 extremities. The patient is able to walk on heels and  the toes.  Sensory examination: Soft touch sensation is symmetric on the face, arms, or legs. The patient has a stocking pattern pinprick sensory deficit up to the knees bilaterally.  Coordination: The patient has good finger-nose-finger and heel-to-shin bilaterally.  Gait and station: The patient has a normal gait. Tandem gait is normal. Romberg is negative. No drift is seen.  Reflexes: Deep tendon reflexes are symmetric, but are depressed.   Assessment/Plan:  1. Diabetes  2. Diabetic peripheral neuropathy  3. Mild gait instability  4. Sleep apnea on CPAP  5. Morbid obesity  6. Bilateral carpal tunnel syndrome  The patient is doing relatively well with the gabapentin for the peripheral neuropathy this point. The patient is safe, he has not had any falls since last seen. The patient will remain on the gabapentin for now. The patient may be having possible mechanical pain in his right foot with weightbearing. If this worsens, the patient may need to see a podiatrist. The patient also has bilateral carpal tunnel syndrome, and he will contact me if he requires a referral to a hand surgeon. The patient will followup in one year.  Jill Alexanders MD 01/23/2014 8:22 PM  [facility] [address] [place], [place] [address]  Phone [phone] Fax [phone]

## 2014-09-15 ENCOUNTER — Encounter: Payer: Self-pay | Admitting: Neurology

## 2015-01-26 ENCOUNTER — Ambulatory Visit (INDEPENDENT_AMBULATORY_CARE_PROVIDER_SITE_OTHER): Payer: Medicare Other | Admitting: Neurology

## 2015-01-26 ENCOUNTER — Ambulatory Visit: Payer: BC Managed Care – PPO | Admitting: Nurse Practitioner

## 2015-01-26 ENCOUNTER — Encounter: Payer: Self-pay | Admitting: Neurology

## 2015-01-26 VITALS — BP 116/60 | HR 60 | Ht 69.0 in | Wt 305.4 lb

## 2015-01-26 DIAGNOSIS — R269 Unspecified abnormalities of gait and mobility: Secondary | ICD-10-CM

## 2015-01-26 DIAGNOSIS — E1142 Type 2 diabetes mellitus with diabetic polyneuropathy: Secondary | ICD-10-CM

## 2015-01-26 HISTORY — DX: Unspecified abnormalities of gait and mobility: R26.9

## 2015-01-26 MED ORDER — GABAPENTIN 600 MG PO TABS: 600.0000 mg | ORAL_TABLET | Freq: Three times a day (TID) | ORAL | Status: DC

## 2015-01-26 NOTE — Patient Instructions (Signed)

## 2015-01-26 NOTE — Progress Notes (Signed)
Reason for visit: Peripheral neuropathy  Marvin Rivas is an 64 y.o. male  History of present illness:  Marvin Rivas is a 64 year old right-handed white male with a history of diabetes associated with a diabetic peripheral neuropathy. The patient is on gabapentin taking 600 mg 3 times daily with some good improvement in the pain level. The patient does have some discomfort in the feet with prolonged weightbearing. He indicates that he cannot walk with bare feet, his skin is sensitive on the feet. He denies any significant balance issues, he reports no falls since last seen. His diabetes is under good control with a hemoglobin A1c of 6.1. He indicates that he does not exercise much. He has a history of lumbosacral spine surgery, some chronic low back pain that is not severe. He sleeps well at night. He returns for an evaluation. He denies any new medical issues that have come up since last seen.  Past Medical History  Diagnosis Date  . Diabetes mellitus without complication   . Hypertension   . Neuropathy   . Polyneuropathy in diabetes(357.2) 01/23/2014  . TIA (transient ischemic attack)   . OSA on CPAP   . Morbid obesity   . Carpal tunnel syndrome on both sides   . Dyslipidemia   . Glaucoma   . Abnormality of gait 01/26/2015    Past Surgical History  Procedure Laterality Date  . Back surgery    . Spinal fusion    . Nasal septum surgery      Family History  Problem Relation Age of Onset  . Prostate cancer Father   . Hypertension Mother     Social history:  reports that he quit smoking about 16 years ago. He does not have any smokeless tobacco history on file. He reports that he does not drink alcohol or use illicit drugs.   No Known Allergies  Medications:  Prior to Admission medications   Medication Sig Start Date End Date Taking? Authorizing Provider  amLODipine-benazepril (LOTREL) 10-20 MG per capsule Take 1 capsule by mouth daily.    Historical Provider, MD    finasteride (PROSCAR) 5 MG tablet Take 5 mg by mouth daily.    Historical Provider, MD  gabapentin (NEURONTIN) 600 MG tablet Take 1 tablet (600 mg total) by mouth 3 (three) times daily. 07/15/13   Kathrynn Ducking, MD  HYDROcodone-acetaminophen [facility]) 10-325 MG per tablet Take 1 tablet by mouth every 6 (six) hours as needed for pain.    Historical Provider, MD  losartan-hydrochlorothiazide (HYZAAR) 100-12.5 MG per tablet Take 1 tablet by mouth daily.    Historical Provider, MD  meloxicam (MOBIC) 15 MG tablet Take 15 mg by mouth daily. 10/27/13   Historical Provider, MD  OVER THE COUNTER MEDICATION Place 2 sprays into the nose 5 (five) times daily. [address] Nasal Spray  ([employer] brand)    Historical Provider, MD  Tamsulosin HCl (FLOMAX) 0.4 MG CAPS Take 0.4 mg by mouth daily.     Historical Provider, MD  traMADol (ULTRAM) 50 MG tablet Take 50 mg by mouth every 6 (six) hours as needed for pain.    Historical Provider, MD    ROS:  Out of a complete 14 system review of symptoms, the patient complains only of the following symptoms, and all other reviewed systems are negative.  Hearing loss, ringing in the ears One sensitivity Numbness Back pain, neck pain Sleep apnea  Blood pressure 116/60, pulse 60, height 5\' 9"  (1.753 m), weight 305 lb  6.4 oz (138.529 kg).  Physical Exam  General: The patient is alert and cooperative at the time of the examination. The patient is moderately to markedly obese.  Skin: No significant peripheral edema is noted.   Neurologic Exam  Mental status: The patient is alert and oriented x 3 at the time of the examination. The patient has apparent normal recent and remote memory, with an apparently normal attention span and concentration ability.   Cranial nerves: Facial symmetry is present. Speech is normal, no aphasia or dysarthria is noted. Extraocular movements are full. Visual fields are full.  Motor: The patient has good strength in all 4  extremities.  Sensory examination: Soft touch sensation is symmetric on the face, arms, and legs, with exception that there is some slight decrease in soft touch sensation on the right leg relative to the left..  Coordination: The patient has good finger-nose-finger and heel-to-shin bilaterally.  Gait and station: The patient has a minimally wide-based gait, the patient walks with a cane. Tandem gait is unsteady. Romberg is negative. No drift is seen.  Reflexes: Deep tendon reflexes are symmetric, but are depressed.   Assessment/Plan:  1. Obesity  2. Diabetes  3. Diabetic peripheral neuropathy  4. Chronic low back pain  5. Sleep apnea  6. Mild gait disorder  The patient has a mild gait disorder, he uses a cane for ambulation. He is getting good improvement with the gabapentin, we will continue this medication, prescription was called in. He will follow-up in one year or sooner if needed. He seems to be relatively stable over time, I have recommended that he enter an exercise program, he needs to perform exercises that do not require weight bearing, such as a stationary bike.  Jill Alexanders MD 01/26/2015 8:56 AM  [facility] [address] [place], [place] [address]  Phone [phone] Fax [phone]

## 2015-04-27 ENCOUNTER — Other Ambulatory Visit: Payer: Self-pay

## 2015-08-27 ENCOUNTER — Ambulatory Visit
Admission: RE | Admit: 2015-08-27 | Discharge: 2015-08-27 | Disposition: A | Discharge status: Home / Self Care | Payer: Medicare Other | Source: Ambulatory Visit | Attending: Family Medicine | Admitting: Family Medicine

## 2015-08-27 ENCOUNTER — Other Ambulatory Visit: Payer: Self-pay | Admitting: Family Medicine

## 2015-08-27 DIAGNOSIS — M542 Cervicalgia: Secondary | ICD-10-CM

## 2015-08-27 DIAGNOSIS — M25552 Pain in left hip: Secondary | ICD-10-CM

## 2016-01-25 ENCOUNTER — Ambulatory Visit: Payer: Medicare Other | Admitting: Adult Health

## 2017-04-05 ENCOUNTER — Other Ambulatory Visit: Payer: Self-pay | Admitting: Orthopaedic Surgery

## 2017-04-05 DIAGNOSIS — M5106 Intervertebral disc disorders with myelopathy, lumbar region: Secondary | ICD-10-CM

## 2017-04-17 ENCOUNTER — Ambulatory Visit
Admission: RE | Admit: 2017-04-17 | Discharge: 2017-04-17 | Disposition: A | Discharge status: Home / Self Care | Payer: Medicare Other | Source: Ambulatory Visit | Attending: Orthopaedic Surgery | Admitting: Orthopaedic Surgery

## 2017-04-17 DIAGNOSIS — M5106 Intervertebral disc disorders with myelopathy, lumbar region: Secondary | ICD-10-CM

## 2017-05-23 ENCOUNTER — Other Ambulatory Visit: Payer: Self-pay | Admitting: Neurosurgery

## 2017-05-31 NOTE — Progress Notes (Signed)
[name] DOB: 04-01-1951   REASON FOR VISIT: follow up for neuropathy HISTORY FROM:patient    HISTORY OF PRESENT ILLNESS:UPDATE 8/2/2018CM Marvin Rivas, 66 year old male returns for follow-up. He was last seen in this office March 2016 by Dr. Jannifer Rivas for diabetic peripheral neuropathy. The patient has been on gabapentin 600 mg 3 times daily since that time. His neuropathy has worsened over the last year. The sheets  bother his feet in bed at night and wake him up. He ambulates with a single-point cane no recent falls. He has a long history of chronic low back pain and is due to have surgery by Dr. Vertell Rivas in September. He reports that his diabetes is in good control he checks his blood sugars. CBG this morning was 100. He also has a history of obstructive sleep apnea and uses CPAP. He returns for reevaluation  01/26/15 KWMr. Rivas is a 66 year old right-handed white male with a history of diabetes associated with a diabetic peripheral neuropathy. The patient is on gabapentin taking 600 mg 3 times daily with some good improvement in the pain level. The patient does have some discomfort in the feet with prolonged weightbearing. He indicates that he cannot walk with bare feet, his skin is sensitive on the feet. He denies any significant balance issues, he reports no falls since last seen. His diabetes is under good control with a hemoglobin A1c of 6.1. He indicates that he does not exercise much. He has a history of lumbosacral spine surgery, some chronic low back pain that is not severe. He sleeps well at night. He returns for an evaluation. He denies any new medical issues that have come up since last seen.   REVIEW OF SYSTEMS: Full 14 system review of systems performed and notable only for those listed, all others are neg:  Constitutional: neg  Cardiovascular: neg Ear/Nose/Throat: neg  Skin: neg Eyes: neg Respiratory: neg Gastroitestinal: neg    Hematology/Lymphatic: neg  Endocrine: neg Musculoskeletal: Chronic back pain  Allergy/Immunology: neg Neurological: Numbness in the feet and lower extremities,  Psychiatric: neg Sleep : Obstructive sleep apnea with CPAP   ALLERGIES: No Known Allergies  HOME MEDICATIONS: Outpatient Medications Prior to Visit  Medication Sig Dispense Refill  . amLODipine-benazepril (LOTREL) 10-20 MG per capsule Take 1 capsule by mouth daily.    Marland Kitchen aspirin 81 MG tablet Take 81 mg by mouth daily.    . finasteride (PROSCAR) 5 MG tablet Take 5 mg by mouth daily.    Marland Kitchen gabapentin (NEURONTIN) 600 MG tablet Take 1 tablet (600 mg total) by mouth 3 (three) times daily. 270 tablet 1  . meloxicam (MOBIC) 15 MG tablet Take 15 mg by mouth daily.    Marland Kitchen OVER THE COUNTER MEDICATION Place 2 sprays into the nose 5 (five) times daily. [address] Nasal Spray  ([employer] brand)    . Tamsulosin HCl (FLOMAX) 0.4 MG CAPS Take 0.4 mg by mouth daily.     Marland Kitchen HYDROcodone-acetaminophen (NORCO) 10-325 MG per tablet Take 1 tablet by mouth every 6 (six) hours as needed for pain.    Marland Kitchen losartan-hydrochlorothiazide (HYZAAR) 100-12.5 MG per tablet Take 1 tablet by mouth daily.    . traMADol (ULTRAM) 50 MG tablet Take 50 mg by mouth every 6 (six) hours as needed for pain.     No facility-administered medications prior to visit.     PAST MEDICAL HISTORY: Past Medical History:  Diagnosis Date  . Abnormality of gait 01/26/2015  .  Carpal tunnel syndrome on both sides   . Diabetes mellitus without complication ([place])   . Dyslipidemia   . Glaucoma   . Hypertension   . Morbid obesity ([place])   . Neuropathy   . OSA on CPAP   . Polyneuropathy in diabetes(357.2) 01/23/2014  . TIA (transient ischemic attack)     PAST SURGICAL HISTORY: Past Surgical History:  Procedure Laterality Date  . BACK SURGERY    . NASAL SEPTUM SURGERY    . SPINAL FUSION      FAMILY HISTORY: Family History  Problem Relation Age of Onset  . Prostate cancer Father    . Hypertension Mother     SOCIAL HISTORY: Social History   Social History  . Marital status: Married    Spouse name: janyce  . Number of children: 2  . Years of education: 12   Occupational History  . MANUFACTURING ENGINEER [employer]   Social History Main Topics  . Smoking status: Former Smoker    Quit date: 01/24/1999  . Smokeless tobacco: Never Used  . Alcohol use No  . Drug use: No  . Sexual activity: Not on file   Other Topics Concern  . Not on file   Social History Narrative   Patient is right handed.   Patient drinks about 6-7 cups of caffeine per day.     PHYSICAL EXAM  Vitals:   06/01/17 0940  BP: (!) 101/59  Pulse: (!) 56  Weight: 272 lb (123.4 kg)   Body mass index is 40.17 kg/m.  Generalized: Well developed, Morbidly obese male in no acute distress  Head: normocephalic and atraumatic,. Oropharynx benign  Neck: Supple,   Musculoskeletal: No deformity   Neurological examination   Mentation: Alert oriented to time, place, history taking. Attention span and concentration appropriate. Recent and remote memory intact.  Follows all commands speech and language fluent.   Cranial nerve II-XII: Pupils were equal round reactive to light extraocular movements were full, visual field were full on confrontational test. Facial sensation and strength were normal. hearing was intact to finger rubbing bilaterally. Uvula tongue midline. head turning and shoulder shrug were normal and symmetric.Tongue protrusion into cheek strength was normal. Motor: normal bulk and tone, full strength in the BUE, BLE, Sensory: Decreased pinprick to knees bilaterally, decreased vibratory to ankles, decreased soft touch to the right leg relative to the left   Coordination: finger-nose-finger, heel-to-shin bilaterally, no dysmetria, no tremor. Reflexes: Depressed and symmetric plantar responses were flexor bilaterally. Gait and Station: Rising up from seated position without  assistance, normal stance,  moderate stride, good arm swing, smooth turning, able to perform tiptoe, and heel walking without difficulty. Tandem gait is unsteady.   DIAGNOSTIC DATA (LABS, IMAGING, TESTING) - I reviewed patient records, labs, notes, testing and imaging myself where available.  Lab Results  Component Value Date   WBC 7.2 12/22/2012   HGB 13.5 12/22/2012   HCT 38.8 (L) 12/22/2012   MCV 88.8 12/22/2012   PLT 219 12/22/2012      Component Value Date/Time   NA 142 12/22/2012 0330   K 3.8 12/22/2012 0330   CL 105 12/22/2012 0330   CO2 29 12/22/2012 0330   GLUCOSE 129 (H) 12/22/2012 0330   BUN 18 12/22/2012 0330   CREATININE 1.01 12/22/2012 0330   CALCIUM 8.6 12/22/2012 0330   PROT 6.8 12/21/2012 0940   ALBUMIN 3.6 12/21/2012 0940   AST 16 12/21/2012 0940   ALT 18 12/21/2012 0940   ALKPHOS 52 12/21/2012  0940   BILITOT 0.2 (L) 12/21/2012 0940   GFRNONAA 77 (L) 12/22/2012 0330   GFRAA 89 (L) 12/22/2012 0330       ASSESSMENT AND PLAN  66 y.o. year old male  has a past medical history of Abnormality of gait , Morbid obesity diabetes with peripheral neuropathy, chronic low back pain and  obstructive sleep apnea with CPAP  PLAN: Increase gabapentin to 4 times daily 600 mg tablets Given information on Metanx patient will call back if he is interested in a prescription Continue to use cane for safe ambulation Exercise for overall health and well-being Follow-up in 6 months I spent 25 minutes in total face to face time with the patient more than 50% of which was spent counseling and coordination of care, reviewing test results reviewing medications and discussing and reviewing the diagnosis of diabetic peripheral neuropathy and further treatment options.  Dennie Bible, [facility], [facility], APRN  [facility] [address], [place], [place] [address] [phone]

## 2017-06-01 ENCOUNTER — Ambulatory Visit (INDEPENDENT_AMBULATORY_CARE_PROVIDER_SITE_OTHER): Payer: Medicare Other | Admitting: Nurse Practitioner

## 2017-06-01 ENCOUNTER — Encounter: Payer: Self-pay | Admitting: Nurse Practitioner

## 2017-06-01 VITALS — BP 101/59 | HR 56 | Wt 272.0 lb

## 2017-06-01 DIAGNOSIS — E1142 Type 2 diabetes mellitus with diabetic polyneuropathy: Secondary | ICD-10-CM | POA: Diagnosis not present

## 2017-06-01 DIAGNOSIS — R269 Unspecified abnormalities of gait and mobility: Secondary | ICD-10-CM

## 2017-06-01 MED ORDER — GABAPENTIN 600 MG PO TABS: 600.0000 mg | ORAL_TABLET | Freq: Four times a day (QID) | ORAL | 6 refills | Status: DC

## 2017-06-01 NOTE — Patient Instructions (Addendum)
Increase gabapentin to 4 times daily 600 mg tablets Given information on Metanx patient will call back if he is interested in a prescription Continue to use cane for safe ambulation Follow-up in 6 months  Diabetic Neuropathy Diabetic neuropathy is a nerve disease or nerve damage that is caused by diabetes mellitus. About half of all people with diabetes mellitus have some form of nerve damage. Nerve damage is more common in those who have had diabetes mellitus for many years and who generally have not had good control of their blood sugar (glucose) level. Diabetic neuropathy is a common complication of diabetes mellitus. There are three common types of diabetic neuropathy and a fourth type that is less common and less understood:  Peripheral neuropathy-This is the most common type of diabetic neuropathy. It causes damage to the nerves of the feet and legs first and then eventually the hands and arms. The damage affects the ability to sense touch.  Autonomic neuropathy-This type causes damage to the autonomic nervous system, which controls the following functions: ? Heartbeat. ? Body temperature. ? Blood pressure. ? Urination. ? Digestion. ? Sweating. ? Sexual function.  Focal neuropathy-Focal neuropathy can be painful and unpredictable and occurs most often in older adults with diabetes mellitus. It involves a specific nerve or one area and often comes on suddenly. It usually does not cause long-term problems.  Radiculoplexus neuropathy- Sometimes called lumbosacral radiculoplexus neuropathy, radiculoplexus neuropathy affects the nerves of the thighs, hips, buttocks, or legs. It is more common in people with type 2 diabetes mellitus and in older men. It is characterized by debilitating pain, weakness, and atrophy, usually in the thigh muscles.  What are the causes? The cause of peripheral, autonomic, and focal neuropathies is diabetes mellitus that is uncontrolled and high glucose levels.  The cause of radiculoplexus neuropathy is unknown. However, it is thought to be caused by inflammation related to uncontrolled glucose levels. What are the signs or symptoms? Peripheral Neuropathy Peripheral neuropathy develops slowly over time. When the nerves of the feet and legs no longer work there may be:  Burning, stabbing, or aching pain in the legs or feet.  Inability to feel pressure or pain in your feet. This can lead to: ? Thick calluses over pressure areas. ? Pressure sores. ? Ulcers.  Foot deformities.  Reduced ability to feel temperature changes.  Muscle weakness.  Autonomic Neuropathy The symptoms of autonomic neuropathy vary depending on which nerves are affected. Symptoms may include:  Problems with digestion, such as: ? Feeling sick to your stomach (nausea). ? Vomiting. ? Bloating. ? Constipation. ? Diarrhea. ? Abdominal pain.  Difficulty with urination. This occurs if you lose your ability to sense when your bladder is full. Problems include: ? Urine leakage (incontinence). ? Inability to empty your bladder completely (retention).  Rapid or irregular heartbeat (palpitations).  Blood pressure drops when you stand up (orthostatic hypotension). When you stand up you may feel: ? Dizzy. ? Weak. ? Faint.  In men, inability to attain and maintain an erection.  In women, vaginal dryness and problems with decreased sexual desire and arousal.  Problems with body temperature regulation.  Increased or decreased sweating.  Focal Neuropathy  Abnormal eye movements or abnormal alignment of both eyes.  Weakness in the wrist.  Foot drop. This results in an inability to lift the foot properly and abnormal walking or foot movement.  Paralysis on one side of your face (Bell palsy).  Chest or abdominal pain. Radiculoplexus Neuropathy  Sudden, severe pain  in your hip, thigh, or buttocks.  Weakness and wasting of thigh muscles.  Difficulty rising from a  seated position.  Abdominal swelling.  Unexplained weight loss (usually more than 10 lb [4.5 kg]). How is this diagnosed? Peripheral Neuropathy Your senses may be tested. Sensory function testing can be done with:  A light touch using a monofilament.  A vibration with tuning fork.  A sharp sensation with a pin prick.  Other tests that can help diagnose neuropathy are:  Nerve conduction velocity. This test checks the transmission of an electrical current through a nerve.  Electromyography. This shows how muscles respond to electrical signals transmitted by nearby nerves.  Quantitative sensory testing. This is used to assess how your nerves respond to vibrations and changes in temperature.  Autonomic Neuropathy Diagnosis is often based on reported symptoms. Tell your health care provider if you experience:  Dizziness.  Constipation.  Diarrhea.  Inappropriate urination or inability to urinate.  Inability to get or maintain an erection.  Tests that may be done include:  Electrocardiography or Holter monitor. These are tests that can help show problems with the heart rate or heart rhythm.  An X-ray exam may be done.  Focal Neuropathy Diagnosis is made based on your symptoms and what your health care provider finds during your exam. Other tests may be done. They may include:  Nerve conduction velocities. This checks the transmission of electrical current through a nerve.  Electromyography. This shows how muscles respond to electrical signals transmitted by nearby nerves.  Quantitative sensory testing. This test is used to assess how your nerves respond to vibration and changes in temperature.  Radiculoplexus Neuropathy  Often the first thing is to eliminate any other issue or problems that might be the cause, as there is no standard test for diagnosis.  X-ray exam of your spine and lumbar region.  Spinal tap to rule out cancer.  MRI to rule out other lesions. How  is this treated? Once nerve damage occurs, it cannot be reversed. The goal of treatment is to keep the disease or nerve damage from getting worse and affecting more nerve fibers. Controlling your blood glucose level is the key. Most people with radiculoplexus neuropathy see at least a partial improvement over time. You will need to keep your blood glucose and HbA1c levels in the target range determined by your health care provider. Things that help control blood glucose levels include:  Blood glucose monitoring.  Meal planning.  Physical activity.  Diabetes medicine.  Over time, maintaining lower blood glucose levels helps lessen symptoms. Sometimes, prescription pain medicine is needed. Follow these instructions at home:  Do not smoke.  Keep your blood glucose level in the range that you and your health care provider have determined acceptable for you.  Keep your blood pressure level in the range that you and your health care provider have determined acceptable for you.  Eat a well-balanced diet.  Be physically active every day. Include strength training and balance exercises.  Protect your feet. ? Check your feet every day for sores, cuts, blisters, or signs of infection. ? Wear padded socks and supportive shoes. Use orthotic inserts, if necessary. ? Regularly check the insides of your shoes for worn spots. Make sure there are no rocks or other items inside your shoes before you put them on. Contact a health care provider if:  You have burning, stabbing, or aching pain in the legs or feet.  You are unable to feel pressure or pain  in your feet.  You develop problems with digestion such as: ? Nausea. ? Vomiting. ? Bloating. ? Constipation. ? Diarrhea. ? Abdominal pain.  You have difficulty with urination, such as: ? Incontinence. ? Retention.  You have palpitations.  You develop orthostatic hypotension. When you stand up you may  feel: ? Dizzy. ? Weak. ? Faint.  You cannot attain and maintain an erection (in men).  You have vaginal dryness and problems with decreased sexual desire and arousal (in women).  You have severe pain in your thighs, legs, or buttocks.  You have unexplained weight loss. This information is not intended to replace advice given to you by your health care provider. Make sure you discuss any questions you have with your health care provider. Document Released: 12/26/2001 Document Revised: 03/24/2016 Document Reviewed: 03/28/2013 Elsevier Interactive Patient Education  2017 [employer].

## 2017-06-01 NOTE — Progress Notes (Signed)
The blood work results are unremarkable. Please call the patient.  

## 2017-07-05 ENCOUNTER — Other Ambulatory Visit (HOSPITAL_COMMUNITY): Payer: Self-pay | Admitting: *Deleted

## 2017-07-05 ENCOUNTER — Encounter (HOSPITAL_COMMUNITY)
Admission: RE | Admit: 2017-07-05 | Discharge: 2017-07-05 | Disposition: A | Discharge status: Home / Self Care | Payer: Medicare Other | Source: Ambulatory Visit | Attending: Neurosurgery | Admitting: Neurosurgery

## 2017-07-05 ENCOUNTER — Encounter (HOSPITAL_COMMUNITY): Payer: Self-pay

## 2017-07-05 DIAGNOSIS — I4510 Unspecified right bundle-branch block: Secondary | ICD-10-CM | POA: Insufficient documentation

## 2017-07-05 DIAGNOSIS — Z01812 Encounter for preprocedural laboratory examination: Secondary | ICD-10-CM | POA: Diagnosis not present

## 2017-07-05 DIAGNOSIS — Z01810 Encounter for preprocedural cardiovascular examination: Secondary | ICD-10-CM | POA: Diagnosis not present

## 2017-07-05 DIAGNOSIS — I10 Essential (primary) hypertension: Secondary | ICD-10-CM | POA: Insufficient documentation

## 2017-07-05 DIAGNOSIS — R001 Bradycardia, unspecified: Secondary | ICD-10-CM | POA: Diagnosis not present

## 2017-07-05 HISTORY — DX: Gastro-esophageal reflux disease without esophagitis: K21.9

## 2017-07-05 HISTORY — DX: Unspecified osteoarthritis, unspecified site: M19.90

## 2017-07-05 LAB — TYPE AND SCREEN
ABO/RH(D): A POS
Antibody Screen: NEGATIVE

## 2017-07-05 LAB — GLUCOSE, CAPILLARY: Glucose-Capillary: 109 mg/dL — ABNORMAL HIGH (ref 65–99)

## 2017-07-05 LAB — CBC
HCT: 43.1 % (ref 39.0–52.0)
HEMOGLOBIN: 14.4 g/dL (ref 13.0–17.0)
MCH: 29.8 pg (ref 26.0–34.0)
MCHC: 33.4 g/dL (ref 30.0–36.0)
MCV: 89.2 fL (ref 78.0–100.0)
PLATELETS: 238 10*3/uL (ref 150–400)
RBC: 4.83 MIL/uL (ref 4.22–5.81)
RDW: 13.3 % (ref 11.5–15.5)
WBC: 6.2 10*3/uL (ref 4.0–10.5)

## 2017-07-05 LAB — BASIC METABOLIC PANEL
ANION GAP: 7 (ref 5–15)
BUN: 17 mg/dL (ref 6–20)
CALCIUM: 9.3 mg/dL (ref 8.9–10.3)
CHLORIDE: 101 mmol/L (ref 101–111)
CO2: 30 mmol/L (ref 22–32)
Creatinine, Ser: 1.29 mg/dL — ABNORMAL HIGH (ref 0.61–1.24)
GFR calc non Af Amer: 55 mL/min — ABNORMAL LOW (ref >60)
Glucose, Bld: 126 mg/dL — ABNORMAL HIGH (ref 65–99)
Potassium: 4 mmol/L (ref 3.5–5.1)
SODIUM: 138 mmol/L (ref 135–145)

## 2017-07-05 LAB — SURGICAL PCR SCREEN
MRSA, PCR: NEGATIVE
Staphylococcus aureus: NEGATIVE

## 2017-07-05 LAB — HEMOGLOBIN A1C
HEMOGLOBIN A1C: 5.4 % (ref 4.8–5.6)
Mean Plasma Glucose: 108.28 mg/dL

## 2017-07-05 NOTE — Progress Notes (Signed)
Pt denies cardiac history, chest pain or sob. Pt is a type 2 diabetic, no longer on medication. States fasting blood sugar is usually between 90-100. Last A1C in June was 5.8. Will draw one today.

## 2017-07-05 NOTE — Pre-Procedure Instructions (Signed)
Marvin Rivas  07/05/2017    Your procedure is scheduled on Tuesday, July 11, 2017 at 7:30 AM.   Report to [facility] Entrance "A" Admitting Office at 5:30 AM.   Call this number if you have problems the morning of surgery: [phone]   Questions prior to day of surgery, please call [phone] between 8 & 4 PM.   Remember:  Do not eat food or drink liquids after midnight Monday, 07/10/17.  Take these medicines the morning of surgery with A SIP OF WATER: Finasteride (Proscar), Gabapentin (Neurontin), Tamsulosin (Flomax)  Stop Aspirin as instructed by physician/surgeon. Stop NSAIDS (Meloxicam, Ibuprofen, Aleve, etc) 5 days prior to surgery.  How to Manage Your Diabetes Before Surgery   Why is it important to control my blood sugar before and after surgery?   Improving blood sugar levels before and after surgery helps healing and can limit problems.  A way of improving blood sugar control is eating a healthy diet by:  - Eating less sugar and carbohydrates  - Increasing activity/exercise  - Talk with your doctor about reaching your blood sugar goals  High blood sugars (greater than 180 mg/dL) can raise your risk of infections and slow down your recovery so you will need to focus on controlling your diabetes during the weeks before surgery.  Make sure that the doctor who takes care of your diabetes knows about your planned surgery including the date and location.  How do I manage my blood sugars before surgery?   Check your blood sugar at least 4 times a day, 2 days before surgery to make sure that they are not too high or low.  Check your blood sugar the morning of your surgery when you wake up and every 2 hours until you get to the Short-Stay unit.  Treat a low blood sugar (less than 70 mg/dL) with 1/2 cup of clear juice (cranberry or apple), 4 glucose tablets, OR glucose gel.  Recheck blood sugar in 15 minutes after treatment (to make sure it is greater  than 70 mg/dL).  If blood sugar is not greater than 70 mg/dL on re-check, call [phone] for further instructions.   Report your blood sugar to the Short-Stay nurse when you get to Short-Stay.  References:  [facility], 2007 "How to Manage your Diabetes Before and After Surgery".    Do not wear jewelry.  Do not wear lotions, powders, cologne or deodorant.  Men may shave face and neck.  Do not bring valuables to the hospital.  [facility] is not responsible for any belongings or valuables.  Contacts, dentures or bridgework may not be worn into surgery.  Leave your suitcase in the car.  After surgery it may be brought to your room.  [facility] - Preparing for Surgery  Before surgery, you can play an important role.  Because skin is not sterile, your skin needs to be as free of germs as possible.  You can reduce the number of germs on you skin by washing with CHG (chlorahexidine gluconate) soap before surgery.  CHG is an antiseptic cleaner which kills germs and bonds with the skin to continue killing germs even after washing.  Please DO NOT use if you have an allergy to CHG or antibacterial soaps.  If your skin becomes reddened/irritated stop using the CHG and inform your nurse when you arrive at Short Stay.  Do not shave (including legs and underarms) for at least 48 hours prior to  the first CHG shower.  You may shave your face.  Please follow these instructions carefully:   1.  Shower with CHG Soap the night before surgery and the                    morning of Surgery.  2.  If you choose to wash your hair, wash your hair first as usual with your       normal shampoo.  3.  After you shampoo, rinse your hair and body thoroughly to remove the shampoo.  4.  Use CHG as you would any other liquid soap.  You can apply chg directly       to the skin and wash gently with scrungie or a clean washcloth.  5.  Apply the CHG Soap to your body ONLY FROM THE NECK DOWN.         Do not use on open wounds or open sores.  Avoid contact with your eyes, ears, mouth and genitals (private parts).  Wash genitals (private parts) with your normal soap.  6.  Wash thoroughly, paying special attention to the area where your surgery        will be performed.  7.  Thoroughly rinse your body with warm water from the neck down.  8.  DO NOT shower/wash with your normal soap after using and rinsing off       the CHG Soap.  9.  Pat yourself dry with a clean towel.            10.  Wear clean pajamas.            11.  Place clean sheets on your bed the night of your first shower and do not        sleep with pets.  Day of Surgery  Do not apply any lotions/deodorants the morning of surgery.  Please wear clean clothes to the hospital.   Please read over the fact sheets that you were given.

## 2017-07-10 MED ORDER — DEXTROSE 5 % IV SOLN
3.0000 g | INTRAVENOUS | Status: AC
  Administered 2017-07-11 (×2): 3 g via INTRAVENOUS
  Filled 2017-07-10: qty 3

## 2017-07-10 NOTE — Anesthesia Preprocedure Evaluation (Addendum)
Anesthesia Evaluation  Patient identified by MRN, date of birth, ID band Patient awake    Reviewed: Allergy & Precautions, NPO status , Patient's Chart, lab work & pertinent test results  Airway Mallampati: II  TM Distance: >3 FB Neck ROM: Full    Dental  (+) Dental Advisory Given   Pulmonary sleep apnea and Continuous Positive Airway Pressure Ventilation , former smoker,    breath sounds clear to auscultation       Cardiovascular hypertension, Pt. on medications  Rhythm:Regular Rate:Normal  1st degree AV block. RBBB   Neuro/Psych TIA   GI/Hepatic Neg liver ROS, GERD  ,  Endo/Other  diabetes, Type 2Morbid obesity  Renal/GU negative Renal ROS     Musculoskeletal  (+) Arthritis ,   Abdominal   Peds  Hematology negative hematology ROS (+)   Anesthesia Other Findings   Reproductive/Obstetrics                            Lab Results  Component Value Date   WBC 6.2 07/05/2017   HGB 14.4 07/05/2017   HCT 43.1 07/05/2017   MCV 89.2 07/05/2017   PLT 238 07/05/2017   Lab Results  Component Value Date   CREATININE 1.29 (H) 07/05/2017   BUN 17 07/05/2017   NA 138 07/05/2017   K 4.0 07/05/2017   CL 101 07/05/2017   CO2 30 07/05/2017    Anesthesia Physical Anesthesia Plan  ASA: III  Anesthesia Plan: General   Post-op Pain Management:    Induction: Intravenous  PONV Risk Score and Plan: 3 and Ondansetron, Dexamethasone, Midazolam and Treatment may vary due to age or medical condition  Airway Management Planned: Oral ETT  Additional Equipment:   Intra-op Plan:   Post-operative Plan: Extubation in OR  Informed Consent: I have reviewed the patients History and Physical, chart, labs and discussed the procedure including the risks, benefits and alternatives for the proposed anesthesia with the patient or authorized representative who has indicated his/her understanding and acceptance.    Dental advisory given  Plan Discussed with: CRNA  Anesthesia Plan Comments:        Anesthesia Quick Evaluation

## 2017-07-11 ENCOUNTER — Encounter (HOSPITAL_COMMUNITY): Payer: Self-pay | Admitting: Surgery

## 2017-07-11 ENCOUNTER — Inpatient Hospital Stay (HOSPITAL_COMMUNITY): Payer: Medicare Other | Admitting: Anesthesiology

## 2017-07-11 ENCOUNTER — Inpatient Hospital Stay (HOSPITAL_COMMUNITY)
Admission: RE | Admit: 2017-07-11 | Discharge: 2017-07-12 | DRG: 455 | Disposition: A | Discharge status: Home / Self Care | Payer: Medicare Other | Source: Ambulatory Visit | Attending: Neurosurgery | Admitting: Neurosurgery

## 2017-07-11 ENCOUNTER — Encounter (HOSPITAL_COMMUNITY)
Admission: RE | Disposition: A | Discharge status: Home / Self Care | Payer: Self-pay | Source: Ambulatory Visit | Attending: Neurosurgery

## 2017-07-11 ENCOUNTER — Inpatient Hospital Stay (HOSPITAL_COMMUNITY): Payer: Medicare Other

## 2017-07-11 DIAGNOSIS — Z981 Arthrodesis status: Secondary | ICD-10-CM

## 2017-07-11 DIAGNOSIS — M160 Bilateral primary osteoarthritis of hip: Secondary | ICD-10-CM | POA: Diagnosis present

## 2017-07-11 DIAGNOSIS — M4316 Spondylolisthesis, lumbar region: Secondary | ICD-10-CM | POA: Diagnosis present

## 2017-07-11 DIAGNOSIS — M5116 Intervertebral disc disorders with radiculopathy, lumbar region: Secondary | ICD-10-CM | POA: Diagnosis present

## 2017-07-11 DIAGNOSIS — I440 Atrioventricular block, first degree: Secondary | ICD-10-CM | POA: Diagnosis present

## 2017-07-11 DIAGNOSIS — E7800 Pure hypercholesterolemia, unspecified: Secondary | ICD-10-CM | POA: Diagnosis present

## 2017-07-11 DIAGNOSIS — Z6839 Body mass index (BMI) 39.0-39.9, adult: Secondary | ICD-10-CM

## 2017-07-11 DIAGNOSIS — R531 Weakness: Secondary | ICD-10-CM | POA: Diagnosis present

## 2017-07-11 DIAGNOSIS — Z87891 Personal history of nicotine dependence: Secondary | ICD-10-CM

## 2017-07-11 DIAGNOSIS — N400 Enlarged prostate without lower urinary tract symptoms: Secondary | ICD-10-CM | POA: Diagnosis present

## 2017-07-11 DIAGNOSIS — I10 Essential (primary) hypertension: Secondary | ICD-10-CM | POA: Diagnosis present

## 2017-07-11 DIAGNOSIS — H5509 Other forms of nystagmus: Secondary | ICD-10-CM | POA: Diagnosis present

## 2017-07-11 DIAGNOSIS — M1990 Unspecified osteoarthritis, unspecified site: Secondary | ICD-10-CM | POA: Diagnosis present

## 2017-07-11 DIAGNOSIS — G4730 Sleep apnea, unspecified: Secondary | ICD-10-CM | POA: Diagnosis present

## 2017-07-11 DIAGNOSIS — M48061 Spinal stenosis, lumbar region without neurogenic claudication: Secondary | ICD-10-CM

## 2017-07-11 DIAGNOSIS — I4510 Unspecified right bundle-branch block: Secondary | ICD-10-CM | POA: Diagnosis present

## 2017-07-11 DIAGNOSIS — K219 Gastro-esophageal reflux disease without esophagitis: Secondary | ICD-10-CM | POA: Diagnosis present

## 2017-07-11 DIAGNOSIS — E1142 Type 2 diabetes mellitus with diabetic polyneuropathy: Secondary | ICD-10-CM | POA: Diagnosis present

## 2017-07-11 DIAGNOSIS — M48062 Spinal stenosis, lumbar region with neurogenic claudication: Principal | ICD-10-CM | POA: Diagnosis present

## 2017-07-11 DIAGNOSIS — Z8673 Personal history of transient ischemic attack (TIA), and cerebral infarction without residual deficits: Secondary | ICD-10-CM

## 2017-07-11 HISTORY — PX: ANTERIOR LAT LUMBAR FUSION: SHX1168

## 2017-07-11 HISTORY — PX: LUMBAR PERCUTANEOUS PEDICLE SCREW 2 LEVEL: SHX5561

## 2017-07-11 LAB — GLUCOSE, CAPILLARY
GLUCOSE-CAPILLARY: 106 mg/dL — AB (ref 65–99)
Glucose-Capillary: 163 mg/dL — ABNORMAL HIGH (ref 65–99)
Glucose-Capillary: 157 mg/dL — ABNORMAL HIGH (ref 65–99)
Glucose-Capillary: 145 mg/dL — ABNORMAL HIGH (ref 65–99)

## 2017-07-11 SURGERY — ANTERIOR LATERAL LUMBAR FUSION 2 LEVELS
Anesthesia: General

## 2017-07-11 MED ORDER — SODIUM CHLORIDE 0.9% FLUSH: 3.0000 mL | INTRAVENOUS | Status: DC | PRN

## 2017-07-11 MED ORDER — FLEET ENEMA 7-19 GM/118ML RE ENEM: 1.0000 | ENEMA | Freq: Once | RECTAL | Status: DC | PRN

## 2017-07-11 MED ORDER — DEXAMETHASONE SODIUM PHOSPHATE 10 MG/ML IJ SOLN
INTRAMUSCULAR | Status: AC
  Filled 2017-07-11: qty 1

## 2017-07-11 MED ORDER — AMLODIPINE BESYLATE 10 MG PO TABS
10.0000 mg | ORAL_TABLET | Freq: Every day | ORAL | Status: DC
  Filled 2017-07-11 (×2): qty 1

## 2017-07-11 MED ORDER — FENTANYL CITRATE (PF) 250 MCG/5ML IJ SOLN
INTRAMUSCULAR | Status: AC
  Filled 2017-07-11: qty 5

## 2017-07-11 MED ORDER — BUPIVACAINE LIPOSOME 1.3 % IJ SUSP
20.0000 mL | Freq: Once | INTRAMUSCULAR | Status: DC
  Filled 2017-07-11: qty 20

## 2017-07-11 MED ORDER — DEXTROSE 5 % IV SOLN
3.0000 g | Freq: Three times a day (TID) | INTRAVENOUS | Status: AC
  Administered 2017-07-11 (×2): 3 g via INTRAVENOUS
  Filled 2017-07-11 (×2): qty 3000

## 2017-07-11 MED ORDER — PHENOL 1.4 % MT LIQD: 1.0000 | OROMUCOSAL | Status: DC | PRN

## 2017-07-11 MED ORDER — PHENYLEPHRINE HCL 10 MG/ML IJ SOLN
INTRAMUSCULAR | Status: DC | PRN
  Administered 2017-07-11: 40 ug/min via INTRAVENOUS

## 2017-07-11 MED ORDER — BUPIVACAINE HCL (PF) 0.5 % IJ SOLN
INTRAMUSCULAR | Status: DC | PRN
  Administered 2017-07-11: 8 mL
  Administered 2017-07-11: 10 mL

## 2017-07-11 MED ORDER — METHOCARBAMOL 1000 MG/10ML IJ SOLN
500.0000 mg | Freq: Four times a day (QID) | INTRAVENOUS | Status: DC | PRN
  Filled 2017-07-11: qty 5

## 2017-07-11 MED ORDER — ALUM & MAG HYDROXIDE-SIMETH 200-200-20 MG/5ML PO SUSP: 30.0000 mL | Freq: Four times a day (QID) | ORAL | Status: DC | PRN

## 2017-07-11 MED ORDER — POLYETHYLENE GLYCOL 3350 17 G PO PACK: 17.0000 g | PACK | Freq: Every day | ORAL | Status: DC | PRN

## 2017-07-11 MED ORDER — PROPOFOL 10 MG/ML IV BOLUS
INTRAVENOUS | Status: AC
  Filled 2017-07-11: qty 20

## 2017-07-11 MED ORDER — LIDOCAINE-EPINEPHRINE 1 %-1:100000 IJ SOLN
INTRAMUSCULAR | Status: AC
  Filled 2017-07-11: qty 1

## 2017-07-11 MED ORDER — PROPOFOL 1000 MG/100ML IV EMUL
INTRAVENOUS | Status: AC
  Filled 2017-07-11: qty 100

## 2017-07-11 MED ORDER — PROPOFOL 500 MG/50ML IV EMUL
INTRAVENOUS | Status: DC | PRN
  Administered 2017-07-11: 100 ug/kg/min via INTRAVENOUS
  Administered 2017-07-11: 90 ug/kg/min via INTRAVENOUS

## 2017-07-11 MED ORDER — PROMETHAZINE HCL 25 MG/ML IJ SOLN: 6.2500 mg | INTRAMUSCULAR | Status: DC | PRN

## 2017-07-11 MED ORDER — FENTANYL CITRATE (PF) 100 MCG/2ML IJ SOLN
INTRAMUSCULAR | Status: DC | PRN
  Administered 2017-07-11: 50 ug via INTRAVENOUS
  Administered 2017-07-11: 150 ug via INTRAVENOUS
  Administered 2017-07-11: 50 ug via INTRAVENOUS
  Administered 2017-07-11: 100 ug via INTRAVENOUS
  Administered 2017-07-11 (×2): 50 ug via INTRAVENOUS

## 2017-07-11 MED ORDER — DEXAMETHASONE SODIUM PHOSPHATE 4 MG/ML IJ SOLN
INTRAMUSCULAR | Status: DC | PRN
  Administered 2017-07-11: 10 mg via INTRAVENOUS

## 2017-07-11 MED ORDER — ONDANSETRON HCL 4 MG/2ML IJ SOLN
INTRAMUSCULAR | Status: DC | PRN
  Administered 2017-07-11: 4 mg via INTRAVENOUS

## 2017-07-11 MED ORDER — LIDOCAINE 2% (20 MG/ML) 5 ML SYRINGE
INTRAMUSCULAR | Status: AC
  Filled 2017-07-11: qty 5

## 2017-07-11 MED ORDER — ONDANSETRON HCL 4 MG PO TABS: 4.0000 mg | ORAL_TABLET | Freq: Four times a day (QID) | ORAL | Status: DC | PRN

## 2017-07-11 MED ORDER — HYDROMORPHONE HCL 1 MG/ML IJ SOLN: 0.2500 mg | INTRAMUSCULAR | Status: DC | PRN

## 2017-07-11 MED ORDER — SODIUM CHLORIDE 0.9 % IV SOLN: 250.0000 mL | INTRAVENOUS | Status: DC

## 2017-07-11 MED ORDER — FINASTERIDE 5 MG PO TABS
5.0000 mg | ORAL_TABLET | Freq: Every day | ORAL | Status: DC
  Administered 2017-07-12: 5 mg via ORAL
  Filled 2017-07-11: qty 1

## 2017-07-11 MED ORDER — MIDAZOLAM HCL 2 MG/2ML IJ SOLN
INTRAMUSCULAR | Status: AC
  Filled 2017-07-11: qty 2

## 2017-07-11 MED ORDER — LIDOCAINE-EPINEPHRINE 1 %-1:100000 IJ SOLN
INTRAMUSCULAR | Status: DC | PRN
  Administered 2017-07-11: 8 mL
  Administered 2017-07-11: 10 mL

## 2017-07-11 MED ORDER — ZOLPIDEM TARTRATE 5 MG PO TABS: 5.0000 mg | ORAL_TABLET | Freq: Every evening | ORAL | Status: DC | PRN

## 2017-07-11 MED ORDER — OXYCODONE-ACETAMINOPHEN 5-325 MG PO TABS
1.0000 | ORAL_TABLET | ORAL | Status: DC | PRN
  Administered 2017-07-11 – 2017-07-12 (×3): 2 via ORAL
  Filled 2017-07-11 (×3): qty 2

## 2017-07-11 MED ORDER — METHOCARBAMOL 500 MG PO TABS
ORAL_TABLET | ORAL | Status: AC
  Filled 2017-07-11: qty 1

## 2017-07-11 MED ORDER — HYDROCHLOROTHIAZIDE 12.5 MG PO CAPS: 12.5000 mg | ORAL_CAPSULE | Freq: Every day | ORAL | Status: DC

## 2017-07-11 MED ORDER — THROMBIN 20000 UNITS EX SOLR
CUTANEOUS | Status: AC
  Filled 2017-07-11: qty 20000

## 2017-07-11 MED ORDER — MORPHINE SULFATE (PF) 4 MG/ML IV SOLN
2.0000 mg | INTRAVENOUS | Status: DC | PRN
Start: 2017-07-11 — End: 2017-07-12
  Administered 2017-07-11: 2 mg via INTRAVENOUS
  Filled 2017-07-11: qty 1

## 2017-07-11 MED ORDER — ACETAMINOPHEN 650 MG RE SUPP: 650.0000 mg | RECTAL | Status: DC | PRN

## 2017-07-11 MED ORDER — GABAPENTIN 600 MG PO TABS
600.0000 mg | ORAL_TABLET | Freq: Four times a day (QID) | ORAL | Status: DC
  Administered 2017-07-11 – 2017-07-12 (×3): 600 mg via ORAL
  Filled 2017-07-11 (×3): qty 1

## 2017-07-11 MED ORDER — CHLORHEXIDINE GLUCONATE CLOTH 2 % EX PADS: 6.0000 | MEDICATED_PAD | Freq: Once | CUTANEOUS | Status: DC

## 2017-07-11 MED ORDER — ONDANSETRON HCL 4 MG/2ML IJ SOLN: 4.0000 mg | Freq: Four times a day (QID) | INTRAMUSCULAR | Status: DC | PRN

## 2017-07-11 MED ORDER — ASPIRIN EC 81 MG PO TBEC
81.0000 mg | DELAYED_RELEASE_TABLET | Freq: Every day | ORAL | Status: DC
  Administered 2017-07-12: 81 mg via ORAL
  Filled 2017-07-11: qty 1

## 2017-07-11 MED ORDER — AMLODIPINE BESY-BENAZEPRIL HCL 10-20 MG PO CAPS: 1.0000 | ORAL_CAPSULE | Freq: Every day | ORAL | Status: DC

## 2017-07-11 MED ORDER — OXYCODONE HCL 5 MG PO TABS
ORAL_TABLET | ORAL | Status: AC
  Filled 2017-07-11: qty 1

## 2017-07-11 MED ORDER — PHENYLEPHRINE HCL 10 MG/ML IJ SOLN
INTRAMUSCULAR | Status: DC | PRN
  Administered 2017-07-11: 80 ug via INTRAVENOUS

## 2017-07-11 MED ORDER — TAMSULOSIN HCL 0.4 MG PO CAPS
0.4000 mg | ORAL_CAPSULE | Freq: Every day | ORAL | Status: DC
  Administered 2017-07-12: 0.4 mg via ORAL
  Filled 2017-07-11: qty 1

## 2017-07-11 MED ORDER — PANTOPRAZOLE SODIUM 40 MG IV SOLR
40.0000 mg | Freq: Every day | INTRAVENOUS | Status: DC
  Administered 2017-07-11: 40 mg via INTRAVENOUS
  Filled 2017-07-11 (×2): qty 40

## 2017-07-11 MED ORDER — SUCCINYLCHOLINE CHLORIDE 20 MG/ML IJ SOLN
INTRAMUSCULAR | Status: DC | PRN
  Administered 2017-07-11: 160 mg via INTRAVENOUS

## 2017-07-11 MED ORDER — 0.9 % SODIUM CHLORIDE (POUR BTL) OPTIME
TOPICAL | Status: DC | PRN
  Administered 2017-07-11: 1000 mL

## 2017-07-11 MED ORDER — SUCCINYLCHOLINE CHLORIDE 200 MG/10ML IV SOSY
PREFILLED_SYRINGE | INTRAVENOUS | Status: AC
  Filled 2017-07-11: qty 10

## 2017-07-11 MED ORDER — LOSARTAN POTASSIUM-HCTZ 50-12.5 MG PO TABS: 1.0000 | ORAL_TABLET | Freq: Every day | ORAL | Status: DC

## 2017-07-11 MED ORDER — OXYCODONE HCL 5 MG PO TABS
5.0000 mg | ORAL_TABLET | ORAL | Status: DC | PRN
  Administered 2017-07-11: 5 mg via ORAL

## 2017-07-11 MED ORDER — LIDOCAINE HCL (CARDIAC) 20 MG/ML IV SOLN
INTRAVENOUS | Status: DC | PRN
  Administered 2017-07-11: 80 mg via INTRAVENOUS

## 2017-07-11 MED ORDER — KCL IN DEXTROSE-NACL 20-5-0.45 MEQ/L-%-% IV SOLN: INTRAVENOUS | Status: DC

## 2017-07-11 MED ORDER — BUPIVACAINE LIPOSOME 1.3 % IJ SUSP
INTRAMUSCULAR | Status: DC | PRN
  Administered 2017-07-11: 20 mL

## 2017-07-11 MED ORDER — ACETAMINOPHEN 325 MG PO TABS: 650.0000 mg | ORAL_TABLET | ORAL | Status: DC | PRN

## 2017-07-11 MED ORDER — BISACODYL 10 MG RE SUPP: 10.0000 mg | Freq: Every day | RECTAL | Status: DC | PRN

## 2017-07-11 MED ORDER — PHENYLEPHRINE 40 MCG/ML (10ML) SYRINGE FOR IV PUSH (FOR BLOOD PRESSURE SUPPORT)
PREFILLED_SYRINGE | INTRAVENOUS | Status: AC
  Filled 2017-07-11: qty 10

## 2017-07-11 MED ORDER — MENTHOL 3 MG MT LOZG: 1.0000 | LOZENGE | OROMUCOSAL | Status: DC | PRN

## 2017-07-11 MED ORDER — METHOCARBAMOL 500 MG PO TABS
500.0000 mg | ORAL_TABLET | Freq: Four times a day (QID) | ORAL | Status: DC | PRN
  Administered 2017-07-11 – 2017-07-12 (×3): 500 mg via ORAL
  Filled 2017-07-11 (×2): qty 1

## 2017-07-11 MED ORDER — EPHEDRINE SULFATE 50 MG/ML IJ SOLN
INTRAMUSCULAR | Status: DC | PRN
  Administered 2017-07-11 (×4): 10 mg via INTRAVENOUS

## 2017-07-11 MED ORDER — LACTATED RINGERS IV SOLN
INTRAVENOUS | Status: DC | PRN
  Administered 2017-07-11 (×3): via INTRAVENOUS

## 2017-07-11 MED ORDER — ONDANSETRON HCL 4 MG/2ML IJ SOLN
INTRAMUSCULAR | Status: AC
  Filled 2017-07-11: qty 2

## 2017-07-11 MED ORDER — PROPOFOL 10 MG/ML IV BOLUS
INTRAVENOUS | Status: DC | PRN
  Administered 2017-07-11: 200 mg via INTRAVENOUS

## 2017-07-11 MED ORDER — SODIUM CHLORIDE 0.9% FLUSH: 3.0000 mL | Freq: Two times a day (BID) | INTRAVENOUS | Status: DC

## 2017-07-11 MED ORDER — DOCUSATE SODIUM 100 MG PO CAPS
100.0000 mg | ORAL_CAPSULE | Freq: Two times a day (BID) | ORAL | Status: DC
  Administered 2017-07-11 – 2017-07-12 (×2): 100 mg via ORAL
  Filled 2017-07-11 (×2): qty 1

## 2017-07-11 MED ORDER — BUPIVACAINE HCL (PF) 0.5 % IJ SOLN
INTRAMUSCULAR | Status: AC
  Filled 2017-07-11: qty 30

## 2017-07-11 MED ORDER — SURGIFOAM 100 EX MISC
CUTANEOUS | Status: DC | PRN
  Administered 2017-07-11: 09:00:00 via TOPICAL

## 2017-07-11 MED ORDER — BENAZEPRIL HCL 20 MG PO TABS
20.0000 mg | ORAL_TABLET | Freq: Every day | ORAL | Status: DC
  Filled 2017-07-11 (×2): qty 1

## 2017-07-11 MED ORDER — MIDAZOLAM HCL 5 MG/5ML IJ SOLN
INTRAMUSCULAR | Status: DC | PRN
  Administered 2017-07-11: 2 mg via INTRAVENOUS

## 2017-07-11 MED ORDER — LOSARTAN POTASSIUM 50 MG PO TABS: 50.0000 mg | ORAL_TABLET | Freq: Every day | ORAL | Status: DC

## 2017-07-11 SURGICAL SUPPLY — 88 items
ADH SKN CLS APL DERMABOND .7 (GAUZE/BANDAGES/DRESSINGS) ×2
BLADE CLIPPER SURG (BLADE) IMPLANT
BONE CANC CHIPS 20CC PCAN1/4 (Bone Implant) ×3 IMPLANT
BONE CANC CHIPS 40CC CAN1/2 (Bone Implant) ×3 IMPLANT
BUR MATCHSTICK NEURO 3.0 LAGG (BURR) ×2 IMPLANT
BUR ROUND FLUTED 5 RND (BURR) ×1 IMPLANT
BUR ROUND FLUTED 5MM RND (BURR) ×1
CARTRIDGE OIL MAESTRO DRILL (MISCELLANEOUS) ×2 IMPLANT
CHIPS CANC BONE 20CC PCAN1/4 (Bone Implant) ×1 IMPLANT
CHIPS CANC BONE 40CC CAN1/2 (Bone Implant) ×1 IMPLANT
CONT SPEC 4OZ CLIKSEAL STRL BL (MISCELLANEOUS) ×3 IMPLANT
COVER BACK TABLE 24X17X13 BIG (DRAPES) IMPLANT
COVER BACK TABLE 60X90IN (DRAPES) ×3 IMPLANT
DECANTER SPIKE VIAL GLASS SM (MISCELLANEOUS) ×6 IMPLANT
DERMABOND ADVANCED (GAUZE/BANDAGES/DRESSINGS) ×4
DERMABOND ADVANCED .7 DNX12 (GAUZE/BANDAGES/DRESSINGS) ×2 IMPLANT
DIFFUSER DRILL AIR PNEUMATIC (MISCELLANEOUS) ×6 IMPLANT
DRAPE C-ARM 42X72 X-RAY (DRAPES) ×6 IMPLANT
DRAPE C-ARMOR (DRAPES) ×6 IMPLANT
DRAPE LAPAROTOMY 100X72X124 (DRAPES) ×6 IMPLANT
DRAPE POUCH INSTRU U-SHP 10X18 (DRAPES) ×6 IMPLANT
DRAPE SURG 17X23 STRL (DRAPES) ×3 IMPLANT
DRSG OPSITE POSTOP 3X4 (GAUZE/BANDAGES/DRESSINGS) ×2 IMPLANT
DRSG OPSITE POSTOP 4X6 (GAUZE/BANDAGES/DRESSINGS) ×2 IMPLANT
DRSG OPSITE POSTOP 4X8 (GAUZE/BANDAGES/DRESSINGS) ×2 IMPLANT
DURAPREP 26ML APPLICATOR (WOUND CARE) ×6 IMPLANT
ELECT REM PT RETURN 9FT ADLT (ELECTROSURGICAL) ×6
ELECTRODE REM PT RTRN 9FT ADLT (ELECTROSURGICAL) ×2 IMPLANT
EVACUATOR 1/8 PVC DRAIN (DRAIN) ×2 IMPLANT
GAUZE SPONGE 4X4 12PLY STRL (GAUZE/BANDAGES/DRESSINGS) ×1 IMPLANT
GAUZE SPONGE 4X4 16PLY XRAY LF (GAUZE/BANDAGES/DRESSINGS) IMPLANT
GLOVE BIO SURGEON STRL SZ8 (GLOVE) ×9 IMPLANT
GLOVE BIOGEL PI IND STRL 8 (GLOVE) ×2 IMPLANT
GLOVE BIOGEL PI IND STRL 8.5 (GLOVE) ×3 IMPLANT
GLOVE BIOGEL PI INDICATOR 8 (GLOVE) ×4
GLOVE BIOGEL PI INDICATOR 8.5 (GLOVE) ×6
GLOVE ECLIPSE 8.0 STRL XLNG CF (GLOVE) ×6 IMPLANT
GLOVE EXAM NITRILE LRG STRL (GLOVE) IMPLANT
GLOVE EXAM NITRILE XL STR (GLOVE) IMPLANT
GLOVE EXAM NITRILE XS STR PU (GLOVE) IMPLANT
GOWN STRL REUS W/ TWL LRG LVL3 (GOWN DISPOSABLE) IMPLANT
GOWN STRL REUS W/ TWL XL LVL3 (GOWN DISPOSABLE) ×3 IMPLANT
GOWN STRL REUS W/TWL 2XL LVL3 (GOWN DISPOSABLE) ×3 IMPLANT
GOWN STRL REUS W/TWL LRG LVL3 (GOWN DISPOSABLE)
GOWN STRL REUS W/TWL XL LVL3 (GOWN DISPOSABLE) ×9
GRAFT BNE CANC CHIPS 1-8 20CC (Bone Implant) IMPLANT
GRAFT BNE CHIP CANC 1-8 40 (Bone Implant) IMPLANT
KIT BASIN OR (CUSTOM PROCEDURE TRAY) ×4 IMPLANT
KIT DILATOR XLIF 5 (KITS) IMPLANT
KIT INFUSE X SMALL 1.4CC (Orthopedic Implant) ×2 IMPLANT
KIT POSITION SURG JACKSON T1 (MISCELLANEOUS) ×3 IMPLANT
KIT ROOM TURNOVER OR (KITS) ×6 IMPLANT
KIT SURGICAL ACCESS MAXCESS 4 (KITS) ×2 IMPLANT
KIT XLIF (KITS) ×2
MARKER SKIN DUAL TIP RULER LAB (MISCELLANEOUS) ×3 IMPLANT
MODULE NVM5 NEXT GEN EMG (NEEDLE) ×2 IMPLANT
MODULUS XLW 10X22X55MM 10 (Spine Construct) ×2 IMPLANT
MODULUS XLW 12X22X60MM 10 (Spine Construct) ×2 IMPLANT
NDL HYPO 21X1.5 SAFETY (NEEDLE) IMPLANT
NDL HYPO 25X1 1.5 SAFETY (NEEDLE) ×2 IMPLANT
NEEDLE HYPO 21X1.5 SAFETY (NEEDLE) ×3 IMPLANT
NEEDLE HYPO 25X1 1.5 SAFETY (NEEDLE) ×6 IMPLANT
NS IRRIG 1000ML POUR BTL (IV SOLUTION) ×6 IMPLANT
OIL CARTRIDGE MAESTRO DRILL (MISCELLANEOUS) ×6
PACK LAMINECTOMY NEURO (CUSTOM PROCEDURE TRAY) ×6 IMPLANT
PAD ARMBOARD 7.5X6 YLW CONV (MISCELLANEOUS) ×9 IMPLANT
PATTIES SURGICAL .5 X.5 (GAUZE/BANDAGES/DRESSINGS) IMPLANT
PATTIES SURGICAL .5 X1 (DISPOSABLE) IMPLANT
PATTIES SURGICAL 1X1 (DISPOSABLE) IMPLANT
PUTTY BONE ATTRAX 10CC STRIP (Putty) ×2 IMPLANT
ROD RELINE 5.5X90MM LORDOTIC (Rod) ×4 IMPLANT
SCREW LOCK RELINE 5.5 TULIP (Screw) ×12 IMPLANT
SCREW RELINE-O POLY 6.5X50MM (Screw) ×8 IMPLANT
SCREW RELINE-O POLY 7.5X50 (Screw) ×6 IMPLANT
SCREW RLINE PLY 2S 50X7.5XPA (Screw) IMPLANT
SPONGE LAP 4X18 X RAY DECT (DISPOSABLE) IMPLANT
STAPLER SKIN PROX WIDE 3.9 (STAPLE) ×3 IMPLANT
SUT VIC AB 1 CT1 18XBRD ANBCTR (SUTURE) ×3 IMPLANT
SUT VIC AB 1 CT1 8-18 (SUTURE) ×9
SUT VIC AB 2-0 CT1 18 (SUTURE) ×9 IMPLANT
SUT VIC AB 3-0 SH 8-18 (SUTURE) ×9 IMPLANT
SYR INSULIN 1ML 31GX6 SAFETY (SYRINGE) IMPLANT
SYRINGE 20CC LL (MISCELLANEOUS) ×2 IMPLANT
TOWEL GREEN STERILE (TOWEL DISPOSABLE) ×6 IMPLANT
TOWEL GREEN STERILE FF (TOWEL DISPOSABLE) ×6 IMPLANT
TRAP SPECIMEN MUCOUS 40CC (MISCELLANEOUS) ×2 IMPLANT
TRAY FOLEY W/METER SILVER 16FR (SET/KITS/TRAYS/PACK) ×6 IMPLANT
WATER STERILE IRR 1000ML POUR (IV SOLUTION) ×6 IMPLANT

## 2017-07-11 NOTE — Evaluation (Signed)
Physical Therapy Evaluation Patient Details Name: Marvin Rivas MRN: [id number] DOB: May 17, 1951 Today's Date: 07/11/2017   History of Present Illness  Pt is a 66 y/o male s/p L2-4 ALIF. PMH includes TIA, TLIF X2, and arthritis  Clinical Impression  Patient is s/p above surgery resulting in the deficits listed below (see PT Problem List). PTA, pt was independent with mobility with use of a cane. Upon eval, pt very sleepy and in increased back pain. Also complained of dizziness upon standing, therefore further mobility limited. Will need to progress mobility in next session. Discussed using RW at home to increase safety with mobility. Wife will be able to assist as needed upon d/c. Will need DME below. Would benefit from outpatient PT once cleared by MD. Patient will benefit from skilled PT to increase their independence and safety with mobility (while adhering to their precautions) to allow discharge to the venue listed below. Will continue to follow acutely.      Follow Up Recommendations Outpatient PT;Supervision for mobility/OOB (once cleared by MD)    Equipment Recommendations  3in1 (PT)    Recommendations for Other Services       Precautions / Restrictions Precautions Precautions: Back Precaution Booklet Issued: Yes (comment) Precaution Comments: Reviewed back precaution handout with pt.  Required Braces or Orthoses: Spinal Brace Spinal Brace: Lumbar corset;Applied in sitting position Restrictions Weight Bearing Restrictions: No      Mobility  Bed Mobility Overal bed mobility: Needs Assistance Bed Mobility: Sidelying to Sit;Sit to Sidelying   Sidelying to sit: Min assist     Sit to sidelying: Min assist General bed mobility comments: Reviewed log roll technique. Pt already in sidelying upon entry and required min A for trunk elevation and min A for LE lift assist upon return to supine.   Transfers Overall transfer level: Needs assistance Equipment used: Rolling walker  (2 wheeled) Transfers: Sit to/from Stand Sit to Stand: Min assist         General transfer comment: Min A for lift assist and for steadying. Verbal cues for safe hand placement. Upon standing, pt reports feeling dizzy and like he was going to pass out, so returned to sitting.   Ambulation/Gait             General Gait Details: Deferred secondary to dizziness.   Stairs            Wheelchair Mobility    Modified Rankin (Stroke Patients Only)       Balance Overall balance assessment: Needs assistance Sitting-balance support: No upper extremity supported;Feet supported Sitting balance-Leahy Scale: Good     Standing balance support: Bilateral upper extremity supported;During functional activity Standing balance-Leahy Scale: Poor Standing balance comment: Reliant on UE support to maintain balance.                              Pertinent Vitals/Pain Pain Assessment: 0-10 Pain Score: 10-Worst pain ever Pain Location: back  Pain Descriptors / Indicators: Aching;Operative site guarding Pain Intervention(s): Limited activity within patient's tolerance;Monitored during session;Repositioned    Home Living Family/patient expects to be discharged to:: Private residence Living Arrangements: Spouse/significant other Available Help at Discharge: Family;Available 24 hours/day Type of Home: House Home Access: Stairs to enter Entrance Stairs-Rails: Right;Left;Can reach both Entrance Stairs-Number of Steps: 3 Home Layout: One level Home Equipment: Walker - 2 wheels;Cane - single point      Prior Function Level of Independence: Independent with assistive device(s)  Comments: Used cane for ambulation      Hand Dominance        Extremity/Trunk Assessment   Upper Extremity Assessment Upper Extremity Assessment: Defer to OT evaluation    Lower Extremity Assessment Lower Extremity Assessment: RLE deficits/detail;Generalized weakness RLE  Deficits / Details: Numbness at baseline. Reports he feels a little less numb.     Cervical / Trunk Assessment Cervical / Trunk Assessment: Other exceptions Cervical / Trunk Exceptions: s/p ALIF   Communication   Communication: No difficulties  Cognition Arousal/Alertness: Lethargic;Suspect due to medications Behavior During Therapy: [facility] for tasks assessed/performed Overall Cognitive Status: Within Functional Limits for tasks assessed                                        General Comments General comments (skin integrity, edema, etc.): Pt's wife present during session.     Exercises     Assessment/Plan    PT Assessment Patient needs continued PT services  PT Problem List Decreased strength;Decreased range of motion;Decreased activity tolerance;Decreased balance;Decreased mobility;Decreased knowledge of use of DME;Decreased knowledge of precautions;Pain       PT Treatment Interventions DME instruction;Gait training;Stair training;Therapeutic exercise;Therapeutic activities;Functional mobility training;Balance training;Neuromuscular re-education;Patient/family education    PT Goals (Current goals can be found in the Care Plan section)  Acute Rehab PT Goals Patient Stated Goal: to decrease pain  PT Goal Formulation: With patient Time For Goal Achievement: 07/18/17 Potential to Achieve Goals: Good    Frequency Min 5X/week   Barriers to discharge        Co-evaluation               AM-PAC PT "6 Clicks" Daily Activity  Outcome Measure Difficulty turning over in bed (including adjusting bedclothes, sheets and blankets)?: A Lot Difficulty moving from lying on back to sitting on the side of the bed? : Unable Difficulty sitting down on and standing up from a chair with arms (e.g., wheelchair, bedside commode, etc,.)?: Unable Help needed moving to and from a bed to chair (including a wheelchair)?: A Lot Help needed walking in hospital room?: A Lot Help  needed climbing 3-5 steps with a railing? : A Lot 6 Click Score: 10    End of Session Equipment Utilized During Treatment: Gait belt;Back brace Activity Tolerance: Treatment limited secondary to medical complications (Comment);Patient limited by lethargy (dizziness ) Patient left: in bed;with call bell/phone within reach;with family/visitor present Nurse Communication: Mobility status;Other (comment) (dizziness ) PT Visit Diagnosis: Other abnormalities of gait and mobility (R26.89);Pain Pain - part of body:  (back )    Time: 0947-0962 PT Time Calculation (min) (ACUTE ONLY): 26 min   Charges:   PT Evaluation $PT Eval Low Complexity: 1 Low PT Treatments $Therapeutic Activity: 8-22 mins   PT G Codes:        Leighton Ruff, PT, DPT  Acute Rehabilitation Services  Pager: [phone]   Rudean Hitt 07/11/2017, 4:17 PM

## 2017-07-11 NOTE — Anesthesia Postprocedure Evaluation (Signed)
Anesthesia Post Note  Patient: Marvin Rivas  Procedure(s) Performed: Procedure(s) (LRB): L2-3 L3-4 Anteriolateral lumbar interbody fusion with posterior fixation and exploration/revision of previous fusion L4 to S1 (N/A) LUMBAR PERCUTANEOUS PEDICLE SCREW 2 LEVEL (N/A)     Patient location during evaluation: PACU Anesthesia Type: General Level of consciousness: awake and alert Pain management: pain level controlled Vital Signs Assessment: post-procedure vital signs reviewed and stable Respiratory status: spontaneous breathing, nonlabored ventilation, respiratory function stable and patient connected to nasal cannula oxygen Cardiovascular status: blood pressure returned to baseline and stable Postop Assessment: no signs of nausea or vomiting Anesthetic complications: no    Last Vitals:  Vitals:   07/11/17 1400 07/11/17 1415  BP: (!) 90/53 94/62  Pulse: 90 97  Resp: 12 16  Temp:  36.5 C  SpO2: 99% 94%    Last Pain:  Vitals:   07/11/17 1247  TempSrc:   PainSc: Tyler Deis

## 2017-07-11 NOTE — Progress Notes (Signed)
Somnolent, but arouses to voice.  MAEW.  Doing well initially postop.

## 2017-07-11 NOTE — Transfer of Care (Signed)
Immediate Anesthesia Transfer of Care Note  Patient: Marvin Rivas  Procedure(s) Performed: Procedure(s) with comments: L2-3 L3-4 Anteriolateral lumbar interbody fusion with posterior fixation and exploration/revision of previous fusion L4 to S1 (N/A) - L2-3 L3-4 Anteriolateral lumbar interbody fusion with posterior fixation and exploration/revision of previous fusion L4 to S1 LUMBAR PERCUTANEOUS PEDICLE SCREW 2 LEVEL (N/A)  Patient Location: PACU  Anesthesia Type:General  Level of Consciousness: awake and alert   Airway & Oxygen Therapy: Patient Spontanous Breathing and Patient connected to face mask oxygen  Post-op Assessment: Report given to RN, Post -op Vital signs reviewed and stable and Patient moving all extremities  Post vital signs: Reviewed and stable  Last Vitals:  Vitals:   07/11/17 0550  BP: 130/64  Pulse: 65  Resp: 18  Temp: 37.1 C  SpO2: 95%    Last Pain:  Vitals:   07/11/17 0610  TempSrc:   PainSc: 5       Patients Stated Pain Goal: 5 (49/35/52 1747)  Complications: No apparent anesthesia complications

## 2017-07-11 NOTE — Anesthesia Procedure Notes (Signed)
Arterial Line Insertion Start/End9/08/2017 7:10 AM, 07/11/2017 7:20 AM Performed by: Ollen Bowl, CRNA  Patient location: Pre-op. Preanesthetic checklist: patient identified, IV checked, site marked, risks and benefits discussed, surgical consent, monitors and equipment checked and pre-op evaluation Lidocaine 1% used for infiltration Right, radial was placed Catheter size: 20 G Hand hygiene performed  and maximum sterile barriers used  Allen's test indicative of satisfactory collateral circulation Attempts: 1 Procedure performed without using ultrasound guided technique. Following insertion, dressing applied and Biopatch. Post procedure assessment: normal  Patient tolerated the procedure well with no immediate complications.

## 2017-07-11 NOTE — H&P (Signed)
Patient ID:   [id number] Patient: Marvin Rivas  Date of Birth: Oct 02, 1951 Visit Type: Office Visit   Date: 05/22/2017 11:45 AM Provider: Marchia Meiers. Vertell Limber MD   This 66 year old male presents for back pain.   History of Present Illness: 1.  back pain  Marvin Rivas, 66 year old retired male, visits for transfer of care from Dr. Ronnald Ramp.  Patient reports when he leans to the left for 10 seconds, he experiences back and bilateral lower extremity pain numbness and tingling that can result in falling. He notes this has been increasing over the last 6 months.  He notes weakness in both legs noting sensation of "giving way" with standing.  He denies history of injury, noting neuropathy for several years, requiring a cane for stability 6 years.  Norco and tramadol are not taken, offering no relief Gabapentin 1200 milligrams per day for neuropathy Mobic daily for carpal tunnel and arthritis  History:  Arthritis hips and neck, TIA?  Four years ago 81 milligram ASA Surgical history:  Deviated septum and tubes in ears 20+ years ago; L5-S1 TLIF 2007, L4-5 TLIF 2008  X-rays and MRI on [place]           PAST MEDICAL/SURGICAL HISTORY   (Reviewed, updated)  Disease/disorder Onset Date Management Date Comments    L4-5 TLIF 2008     L5-S1 TLIF 2007     Surgery, lumbar spine  ER 04/10/2017 -2007 and 2008  Enlarged prostate      Hypercholesterolemia      Neuropathy      Stroke      Hypertension      Arthritis      Diabetes         PAST MEDICAL HISTORY, SURGICAL HISTORY, FAMILY HISTORY, SOCIAL HISTORY AND REVIEW OF SYSTEMS I have reviewed the patient's past medical, surgical, family and social history as well as the comprehensive review of systems as included on the [facility] history form dated 04/10/2017, which I have signed.  Family History: Reviewed, no changes.  Last detailed document date:04/10/2017.   Social History: Reviewed, no changes. Last detailed  document date: 04/10/2017.    MEDICATIONS(added, continued or stopped this visit): Started Medication Directions Instruction Stopped   finasteride 5 mg tablet take 1 tablet by oral route  every day     gabapentin 600 mg tablet take 1 tablet by oral route 3 times every day     losartan 50 mg-hydrochlorothiazide 12.5 mg tablet take 1 tablet by oral route  every day     Lotrel 10 mg-20 mg capsule take 1 capsule by oral route  every day     meloxicam 15 mg tablet take 1 tablet by oral route  every day     tamsulosin 0.4 mg capsule take 1 capsule by oral route  every day 1/2 hour following the same meal each day       ALLERGIES: Ingredient Reaction Medication Name Comment  TRIAMTERENE  Maxzide   HYDROCHLOROTHIAZIDE  Maxzide    Reviewed, no changes.   Review of Systems System Neg/Pos Details  Constitutional Negative Chills, Fatigue, Fever, Malaise, Night sweats, Weight gain and Weight loss.  ENMT Negative Ear drainage, Hearing loss, Nasal drainage, Otalgia, Sinus pressure and Sore throat.  Eyes Negative Eye discharge, Eye pain and Vision changes.  Respiratory Negative Chronic cough, Cough, Dyspnea, Known TB exposure and Wheezing.  Cardio Negative Chest pain, Claudication, Edema and Irregular heartbeat/palpitations.  GI Negative Abdominal pain, Blood in stool, Change in  stool pattern, Constipation, Decreased appetite, Diarrhea, Heartburn, Nausea and Vomiting.  GU Negative Dribbling, Dysuria, Erectile dysfunction, Hematuria, Polyuria (Genitourinary), Slow stream, Urinary frequency, Urinary incontinence and Urinary retention.  Endocrine Negative Cold intolerance, Heat intolerance, Polydipsia and Polyphagia.  Neuro Positive Extremity weakness, Gait disturbance, Numbness in extremity.  Psych Negative Anxiety, Depression and Insomnia.  Integumentary Negative Brittle hair, Brittle nails, Change in shape/size of mole(s), Hair loss, Hirsutism, Hives, Pruritus, Rash and Skin lesion.  MS Positive  Back pain.  Hema/Lymph Negative Easy bleeding, Easy bruising and Lymphadenopathy.  Allergic/Immuno Negative Contact allergy, Environmental allergies, Food allergies and Seasonal allergies.  Reproductive Negative Penile discharge and Sexual dysfunction.   Vitals Date Temp F BP Pulse Ht In Wt Lb BMI BSA Pain Score  05/22/2017  110/63 65 69 270 39.87  3/10     PHYSICAL EXAM General Level of Distress: no acute distress Overall Appearance: normal  Head and Face  Right Left  Fundoscopic Exam:  normal normal    Cardiovascular Cardiac: regular rate and rhythm without murmur  Right Left  Carotid Pulses: normal normal  Respiratory Lungs: clear to auscultation  Neurological Orientation: normal Recent and Remote Memory: normal Attention Span and Concentration:   normal Language: normal Fund of Knowledge: normal  Right Left Sensation: normal normal Upper Extremity Coordination: normal normal  Lower Extremity Coordination: normal normal  Musculoskeletal Gait and Station: normal  Right Left Upper Extremity Muscle Strength: normal normal Lower Extremity Muscle Strength: normal normal Upper Extremity Muscle Tone:  normal normal Lower Extremity Muscle Tone: normal normal   Motor Strength Upper and lower extremity motor strength was tested in the clinically pertinent muscles.     Deep Tendon Reflexes  Right Left Biceps: normal normal Triceps: normal normal Brachioradialis: normal normal Patellar: normal normal Achilles: normal normal  Cranial Nerves II. Optic Nerve/Visual Fields: normal III. Oculomotor: normal IV. Trochlear: normal V. Trigeminal: normal VI. Abducens: normal VII. Facial: normal VIII. Acoustic/Vestibular: normal IX. Glossopharyngeal: normal X. Vagus: normal XI. Spinal Accessory: normal XII. Hypoglossal: normal  Motor and other Tests Lhermittes: negative Rhomberg: negative Pronator  drift: absent     Right Left Hoffman's: normal normal Clonus: normal normal Babinski: normal normal   Additional Findings:  Full strength on lower extremities.    IMPRESSION Patient presents with lower extremity numbness and weakness. X-ray shows mild sagittal imbalance.  I explained to patient that he does not require a multilevel fusion, but only surgery at L 23 and L 34 levels.  I also explained that XLIF will likely get him greater correction than PLIF at these levels.  Calculations with 10 degree lordotic cages at these levels bring his SVA to 27 mm with 2 degree mismatch, as opposed to MASPLIF, which will result in higher SVA and mismatch . MRI shows a bulging disc at T12-L1 on the right, L1-2 mild stenosis, L2-3 severe stenosis, L3-4 severe stenosis. On confrontational testing, full strength on lower extremities.  Patient wishes to proceed with surgery.  Completed Orders (this encounter) Order Details Reason Side Interpretation Result Initial Treatment Date Region  Scoliosis- AP/Lat      05/22/2017   Dietary management education, guidance, and counseling patient encouraged to eat a well balanced diet         Assessment/Plan # Detail Type Description   1. 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       2. 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   Plan Orders Aspen Lo Sag Rigid Panel Quick.  3. Assessment Low back pain, unspecified back pain laterality, with sciatica presence unspecified (M54.5).       4. Assessment Body mass index (BMI) 39.0-39.9, adult (Z68.39).   Plan Orders Today's instructions / counseling include(s) Dietary management education, guidance, and counseling.           Pain Management Plan Pain Scale: 3/10. Method: Numeric Pain Intensity Scale. Location: back. Onset: 04/10/2016. Duration: varies. Quality: discomforting. Pain management follow-up plan of care: Patient is not using any methods to  relieve pain..  Schedule L 23 and L 34 XLIF with percutaneous pedicle screw fixation.  Fit for LSO brace.   Nurse education given.  Orders: Diagnostic Procedures: Assessment Procedure  M48.062 Scoliosis- AP/Lat  M54.5 Lumbar Spine- AP/Lat  Instruction(s)/Education: Assessment Instruction  Z68.39 Dietary management education, guidance, and counseling  Miscellaneous: Assessment   M48.062 Aspen Lo Sag Rigid Panel Quick             Provider:  Vertell Limber MD, Marchia Meiers 05/28/2017 3:22 PM  Dictation edited by: Marchia Meiers. Vertell Limber    CC Providers: [facility] [address] [place],  [place]  [id number]-   David Jones MD  [address] [place], [place] [address]              Electronically signed by Marchia Meiers. Vertell Limber MD on 05/28/2017 03:22 PM  Patient ID:   [id number] Patient: Marvin Rivas  Date of Birth: [birth year] Visit Type: Office Visit   Date: 04/25/2017 07:45 AM Provider: Eustace Moore MD   This 66 year old male presents for back pain.   History of Present Illness: 1.  back pain  No change in chronic back pain with bilateral leg pain with feeling of weakness and numbness in the legs.  Here to go over imaging.         Medical/Surgical/Interim History Reviewed, no change.  Last detailed document date:04/10/2017.     PAST MEDICAL HISTORY, SURGICAL HISTORY, FAMILY HISTORY, SOCIAL HISTORY AND REVIEW OF SYSTEMS I have reviewed the patient's past medical, surgical, family and social history as well as the comprehensive review of systems as included on the [facility] history form dated 04/10/2017, which I have signed.  Family History: Reviewed, no changes.  Last detailed document date:04/10/2017.   Social History: Reviewed, no changes. Last detailed document date: 04/10/2017.    MEDICATIONS(added, continued or stopped this visit): Started Medication Directions Instruction Stopped    finasteride 5 mg tablet take 1 tablet by oral route  every day     gabapentin 600 mg tablet take 1 tablet by oral route 3 times every day     losartan 50 mg-hydrochlorothiazide 12.5 mg tablet take 1 tablet by oral route  every day     Lotrel 10 mg-20 mg capsule take 1 capsule by oral route  every day     meloxicam 15 mg tablet take 1 tablet by oral route  every day     tamsulosin 0.4 mg capsule take 1 capsule by oral route  every day 1/2 hour following the same meal each day       ALLERGIES: Ingredient Reaction Medication Name Comment  TRIAMTERENE  Maxzide   HYDROCHLOROTHIAZIDE  Maxzide    Reviewed, no changes.    Vitals Date Temp F BP Pulse Ht In Wt Lb BMI BSA Pain Score  04/25/2017  147/61 65 69 277 40.91  5/10     PHYSICAL EXAM General Level of Distress: no acute  distress    Cardiovascular Cardiac: regular rate and rhythm  Respiratory Lungs: non-labored  Neurological Orientation: normal Attention Span and Concentration:   normal Language: normal  Right Left Sensation: normal normal  Musculoskeletal Gait and Station: normal with a cane  Right Left Upper Extremity Muscle Strength: normal normal Lower Extremity Muscle Strength: normal normal Upper Extremity Muscle Tone:  normal normal Lower Extremity Muscle Tone: normal normal   Motor Strength Upper and lower extremity motor strength was tested in the clinically pertinent muscles.    Gaze Normal Horizontal Gaze Stability: normal Horizontal Nystagmus:  present Vertical Gaze Stability:  normal Vertical Nystagmus:  present  Near Point Convergence: normal  Deep Tendon Reflexes  Right Left Biceps: normal normal Triceps: not tested not tested Brachioradialis: normal normal Patellar: normal normal Achilles: normal normal  Motor and other Tests Lhermittes: not tested     DIAGNOSTIC RESULTS MRI of the lumbar spine shows solid fusions at L4-5 and L5-S1 with pedicle screws and place.  He has  significant adjacent level stenosis at L2-3 and L3-4 with retrolisthesis at each level.  He has multilevel degenerative disc disease.  There is a rightward disc protrusion that looks calcified at T12-L1 that to flex the cord but does not compress the cord.  There is mild narrowing at L1-2.  Looking at his plain films of flexion-extension views he does not have overt instability, he does have solid fusions at L4-5 and L5-S1 with pedicle screws in place, there is retrolisthesis at L2-3 and L3-4, I suspect he has a loss of sagittal balance based on short plate imaging    IMPRESSION Previous L4-5 and L5-S1 fusion by Dr. Patrice Paradise.  He has canceled his appointment with this physician.  He has adjacent level stenosis with back pain and leg symptoms.  He has had this for quite a long time.  I suspect based on his imaging he is out of sagittal balance.  He has significant spinal stenosis at L2-3 and L3-4.  I suspect he will need more than adjacent level interbody fusions at L2-3 and L3-4.  He will likely need a adult deformity type corrective surgery with extension up into the thoracic region.  I have explained to him that I have chosen not to pursue this type of surgery and my practice and I am recommending a referral to Dr. Vertell Limber for evaluation and management.  If he feels the patient is best served by a short-segment decompression and instrumented fusion than I am happy to do that.  Completed Orders (this encounter) Order Details Reason Side Interpretation Result Initial Treatment Date Region  Lifestyle education regarding diet Encouraged to eat a well balanced diet and follow up with primary care physician.        Hypertension education Follow up with primary care physician.         Assessment/Plan # Detail Type Description   1. 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       2. Assessment Spinal stenosis of lumbar region with neurogenic claudication (M48.062).       3. Assessment Body mass index  (BMI) 40.0-44.9, adult (Z68.41).   Plan Orders Today's instructions / counseling include(s) Lifestyle education regarding diet.       4. Assessment Essential (primary) hypertension (I10).           Pain Management Plan Pain Scale: 5/10. Method: Numeric Pain Intensity Scale. Location: back. Onset: 04/10/2016. Duration: varies. Quality: discomforting. Pain management follow-up plan of care: Patient is taking medications as prescribed..  Referral  to Dr. Vertell Limber for consideration of management of spinal deformity  Orders: Instruction(s)/Education: Assessment Instruction  I10 Hypertension education  Z68.41 Lifestyle education regarding diet             Provider:  Ronnald Ramp MD, Camelia Eng 04/25/2017 8:33 AM  Dictation edited by: Eustace Moore    CC Providers: [facility] [address] [place],  [place]  [id number]-   David Jones MD  [address] [place], [place] [address]              Electronically signed by Eustace Moore MD on 04/25/2017 08:33 AM  Patient ID:   [id number] Patient: Marvin Rivas  Date of Birth: [birth year] Visit Type: Office Visit   Date: 04/10/2017 08:30 AM Provider: Eustace Moore MD   This 66 year old male presents for back pain.   History of Present Illness: 1.  back pain  The symptoms began 6 months ago. The symptoms are reported as being severe. The symptoms occur daily. The location is Low back. The symptoms are described as Aching. Aggravating factors include Standing or walking or leaning to the left. Relieving factors include Nothing. He states the symptoms are chronic and are uncontrolled. Symptoms are progressive.  He is status post lumbar fusion at L4-5 and L5-S1 by Dr. Rennis Harding in 2007 and 2008.  He is on meloxicam and gabapentin.  He has a history of neuropathy and he states his legs feel cold.  He states he has tried prednisone pack, hydrocodone, tramadol, these offer no relief that he  does not take them.  He states his legs "lose all feeling."  He also states "my legs feel like they are going to drop out from under me."          PAST MEDICAL HISTORY, SURGICAL HISTORY, FAMILY HISTORY, SOCIAL HISTORY AND REVIEW OF SYSTEMS I have reviewed the patient's past medical, surgical, family and social history as well as the comprehensive review of systems as included on the [facility] history form dated 04/10/2017, which I have signed.   MEDICATIONS(added, continued or stopped this visit):   ALLERGIES:    Vitals Date Temp F BP Pulse Ht In Wt Lb BMI BSA Pain Score  04/10/2017  115/66 64 69 280 41.35  5/10     PHYSICAL EXAM General Level of Distress: no acute distress Overall Appearance: Normal    Cardiovascular Cardiac: regular rate and rhythm  Respiratory Lungs: non-labored  Neurological Orientation: normal Recent and Remote Memory: normal Attention Span and Concentration:   normal Language: normal Fund of Knowledge: normal  Right Left Sensation: normal normal Upper Extremity Coordination: normal normal  Lower Extremity Coordination: normal normal  Musculoskeletal Gait and Station: normal with cane  Right Left Upper Extremity Muscle Strength: normal normal Lower Extremity Muscle Strength: normal normal Upper Extremity Muscle Tone:  normal normal Lower Extremity Muscle Tone: normal normal   Motor Strength Upper and lower extremity motor strength was tested in the clinically pertinent muscles. Any abnormal findings will be noted below.   Right Left Hip Flexor: 4/5   Gaze Normal Horizontal Gaze Stability: normal Horizontal Nystagmus:  present Vertical Gaze Stability:  normal Vertical Nystagmus:  present  Near Point Convergence: normal  Deep Tendon Reflexes  Right Left Patellar: absent absent Achilles: absent absent  Cranial Nerves II. Optic Nerve/Visual Fields: normal III. Oculomotor: normal IV.  Trochlear: normal V. Trigeminal: normal VI. Abducens: normal VII. Facial: normal VIII. Acoustic/Vestibular: normal  IX. Glossopharyngeal: normal X. Vagus: normal XI. Spinal Accessory: normal XII. Hypoglossal: normal  Motor and other Tests Lhermittes: negative Pronator drift: absent     Right Left Hoffman's: absent absent SLR: negative negative Toe Walk: normal normal Heel Walk: normal normal      IMPRESSION I suspect he has an element of adjacent level stenosis given his remote fusions at L4-5 and L5-S1.  He has 6 months of progressive symptoms refractory to medical management.  Assessment/Plan # Detail Type Description   1. Assessment Low back pain, unspecified back pain laterality, with sciatica presence unspecified (M54.5).           Pain Management Plan Location: back. Onset: 04/10/2016. Duration: varies. Quality: discomforting. Pain management follow-up plan of care: Patient is taking medications as prescribed..  He has seen his orthopedic surgeon who has ordered an MRI which is scheduled for next Monday.  I would like to see him back in 2 weeks to go over the imaging and will get plain films of flexion-extension views at that time.  He is pleased with this plan.  Orders: Diagnostic Procedures: Assessment Procedure  M54.5 Lumbar Spine- AP/Lat/Flex/Ex             Provider:  Ronnald Ramp MD, Camelia Eng 04/10/2017 8:43 AM  Dictation edited by: Eustace Moore    CC Providers: [facility] [address] [place],  [place]  [id number]-   David Jones MD  [address] [place], [place] [address]              Electronically signed by Eustace Moore MD on 04/10/2017 08:43 AM

## 2017-07-11 NOTE — Brief Op Note (Signed)
07/11/2017  12:26 PM  PATIENT:  Marvin Rivas  66 y.o. male  PRE-OPERATIVE DIAGNOSIS:  Spinal stenosis, Lumbar region with neurogenic claudication, lumbar spondylolisthesis, lumbago, radiculopathy  POST-OPERATIVE DIAGNOSIS:  Spinal stenosis, Lumbar region with neurogenic claudication, lumbar spondylolisthesis, lumbago, radiculopathy   PROCEDURE:  Procedure(s) with comments: L2-3 L3-4 Anteriolateral lumbar interbody fusion with posterior fixation and exploration/revision of previous fusion L4 to S1 (N/A) - L2-3 L3-4 Anteriolateral lumbar interbody fusion with posterior fixation and exploration/revision of previous fusion L4 to S1 LUMBAR PEDICLE SCREW 2 LEVEL (N/A) with posterolateral arthrodesis  SURGEON:  Surgeon(s) and Role:    Erline Levine, MD - Primary    * Consuella Lose, MD - Assisting  PHYSICIAN ASSISTANT:   ASSISTANTS: [place], RN   ANESTHESIA:   general  EBL:  Total I/O In: 2000 [I.V.:2000] Out: 700 [Urine:400; Blood:300]  BLOOD ADMINISTERED:none  DRAINS: (Medium) Hemovact drain(s) in the paravertebral space with  Suction Open   LOCAL MEDICATIONS USED:  MARCAINE    and LIDOCAINE   SPECIMEN:  No Specimen  DISPOSITION OF SPECIMEN:  N/A  COUNTS:  YES  TOURNIQUET:  * No tourniquets in log *  DICTATION: DICTATION: Patient is a 66 year old with severe 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 It was elected to take him to surgery for anterolateral decompression and posterior pedicle screw fixation with exploration of prior fusion.  Procedure: Patient was brought to the operating room and placed in a left lateral decubitus position on the operative table and using orthogonally projected C-arm fluoroscopy the patient was placed so that the L2-3 and L3-4 and levels were visualized in AP and lateral plane. The patient was then taped into position. The table was flexed. Skin was marked along with a posterior finger dissection incision. His flank  was then prepped and draped in usual sterile fashion and incisions were made sequentially at  L3-4 and L2-3 levels. Posterior finger dissection was made to enter the retroperitoneal space and then subsequently the probe was inserted into the psoas muscle from the left side initially at the L 34 level. After mapping the neural elements were able to dock the probe per the posterior third of this vertebral level and without indications electrically of too close proximity to the neural tissues. Subsequently the self-retaining tractor was after sequential dilators were utilized the shim was employed and the interspace was cleared of psoas muscle and then incised. A thorough discectomy was performed. Instruments were used to clear the interspace of disc material. After thorough discectomy was performed and this was performed using AP and lateral fluoroscopy a 12 lordotic by 60 x 22 mm implant was packed with extra small BMP and Attrax. This was tamped into position using the slides and its position was confirmed on AP and lateral fluoroscopy. Subsequently exposure was performed at the L 23 level and similar dissection was performed with locking of the self-retaining retractor. At this level were able to place a 10 lordotic by 22 x 55 mm implant packed in a similar fashion.  Hemostasis was assured the wounds were irrigated and closed with interrupted Vicryl sutures.  Patient was then turned into a prone position on the operating table on Jackson Table and using AP and previous incision was reopened and old pedicle screws were removed from L 4 - S 1 levels.  There appeared to be solid bone growth at these levels with no evidence of screw loosening or pseudoarthrosis. 2 screws were placed at L2 and (6.5 x 50 mm)  and 2 at L 3 of a similar size. 2 screws were placed at L 4 (7.5 x 50 mm bilaterally). 90 mm rods were then affixed to the screw heads and locked down on the screws. All connections were then torqued. The  posterolateral region was decorticated and 60 cc allograft bone graft was placed in the posterolateral region, L 2 - L 4 levels.  A medium Hemovac drain was placed.  The wounds were irrigated and then closed with 1, 2-0 and 3-0 Vicryl stitches. Sterile occlusive dressing was placed with Dermabond. The patient was then extubated in the operating room and taken to recovery in stable and satisfactory condition having tolerated his operation well. Counts were correct at the end of the case.   PLAN OF CARE: Admit to inpatient   PATIENT DISPOSITION:  PACU - hemodynamically stable.   Delay start of Pharmacological VTE agent (>24hrs) due to surgical blood loss or risk of bleeding: yes

## 2017-07-11 NOTE — Interval H&P Note (Signed)
History and Physical Interval Note:  07/11/2017 7:41 AM  Marvin Rivas  has presented today for surgery, with the diagnosis of Spinal stenosis, Lumbar region with neurogenic claudication  The various methods of treatment have been discussed with the patient and family. After consideration of risks, benefits and other options for treatment, the patient has consented to  Procedure(s) with comments: L2-3 L3-4 Anteriolateral lumbar interbody fusion with posterior fixation and exploration/revision of previous fusion L4 to S1 (N/A) - L2-3 L3-4 Anteriolateral lumbar interbody fusion with posterior fixation and exploration/revision of previous fusion L4 to S1 LUMBAR PERCUTANEOUS PEDICLE SCREW 2 LEVEL (N/A) as a surgical intervention .  The patient's history has been reviewed, patient examined, no change in status, stable for surgery.  I have reviewed the patient's chart and labs.  Questions were answered to the patient's satisfaction.     Brek Reece D

## 2017-07-11 NOTE — Op Note (Signed)
07/11/2017  12:26 PM  PATIENT:  Marvin Rivas  66 y.o. male  PRE-OPERATIVE DIAGNOSIS:  Spinal stenosis, Lumbar region with neurogenic claudication, lumbar spondylolisthesis, lumbago, radiculopathy  POST-OPERATIVE DIAGNOSIS:  Spinal stenosis, Lumbar region with neurogenic claudication, lumbar spondylolisthesis, lumbago, radiculopathy   PROCEDURE:  Procedure(s) with comments: L2-3 L3-4 Anteriolateral lumbar interbody fusion with posterior fixation and exploration/revision of previous fusion L4 to S1 (N/A) - L2-3 L3-4 Anteriolateral lumbar interbody fusion with posterior fixation and exploration/revision of previous fusion L4 to S1 LUMBAR PEDICLE SCREW 2 LEVEL (N/A) with posterolateral arthrodesis  SURGEON:  Surgeon(s) and Role:    Erline Levine, MD - Primary    * Consuella Lose, MD - Assisting  PHYSICIAN ASSISTANT:   ASSISTANTS: Poteat, RN   ANESTHESIA:   general  EBL:  Total I/O In: 2000 [I.V.:2000] Out: 700 [Urine:400; Blood:300]  BLOOD ADMINISTERED:none  DRAINS: (Medium) Hemovact drain(s) in the paravertebral space with  Suction Open   LOCAL MEDICATIONS USED:  MARCAINE    and LIDOCAINE   SPECIMEN:  No Specimen  DISPOSITION OF SPECIMEN:  N/A  COUNTS:  YES  TOURNIQUET:  * No tourniquets in log *  DICTATION: DICTATION: Patient is a 66 year old with severe 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 It was elected to take him to surgery for anterolateral decompression and posterior pedicle screw fixation with exploration of prior fusion.  Procedure: Patient was brought to the operating room and placed in a left lateral decubitus position on the operative table and using orthogonally projected C-arm fluoroscopy the patient was placed so that the L2-3 and L3-4 and levels were visualized in AP and lateral plane. The patient was then taped into position. The table was flexed. Skin was marked along with a posterior finger dissection incision. His flank  was then prepped and draped in usual sterile fashion and incisions were made sequentially at  L3-4 and L2-3 levels. Posterior finger dissection was made to enter the retroperitoneal space and then subsequently the probe was inserted into the psoas muscle from the left side initially at the L 34 level. After mapping the neural elements were able to dock the probe per the posterior third of this vertebral level and without indications electrically of too close proximity to the neural tissues. Subsequently the self-retaining tractor was after sequential dilators were utilized the shim was employed and the interspace was cleared of psoas muscle and then incised. A thorough discectomy was performed. Instruments were used to clear the interspace of disc material. After thorough discectomy was performed and this was performed using AP and lateral fluoroscopy a 12 lordotic by 60 x 22 mm implant was packed with extra small BMP and Attrax. This was tamped into position using the slides and its position was confirmed on AP and lateral fluoroscopy. Subsequently exposure was performed at the L 23 level and similar dissection was performed with locking of the self-retaining retractor. At this level were able to place a 10 lordotic by 22 x 55 mm implant packed in a similar fashion.  Hemostasis was assured the wounds were irrigated and closed with interrupted Vicryl sutures.  Patient was then turned into a prone position on the operating table on Jackson Table and using AP and previous incision was reopened and old pedicle screws were removed from L 4 - S 1 levels.  There appeared to be solid bone growth at these levels with no evidence of screw loosening or pseudoarthrosis. 2 screws were placed at L2 and (6.5 x 50 mm)  and 2 at L 3 of a similar size. 2 screws were placed at L 4 (7.5 x 50 mm bilaterally). 90 mm rods were then affixed to the screw heads and locked down on the screws. All connections were then torqued. The  posterolateral region was decorticated and 60 cc allograft bone graft was placed in the posterolateral region, L 2 - L 4 levels.  A medium Hemovac drain was placed.  The wounds were irrigated and then closed with 1, 2-0 and 3-0 Vicryl stitches. Sterile occlusive dressing was placed with Dermabond. The patient was then extubated in the operating room and taken to recovery in stable and satisfactory condition having tolerated his operation well. Counts were correct at the end of the case.   PLAN OF CARE: Admit to inpatient   PATIENT DISPOSITION:  PACU - hemodynamically stable.   Delay start of Pharmacological VTE agent (>24hrs) due to surgical blood loss or risk of bleeding: yes

## 2017-07-11 NOTE — Anesthesia Procedure Notes (Signed)
Procedure Name: Intubation Date/Time: 07/11/2017 7:57 AM Performed by: Ollen Bowl Pre-anesthesia Checklist: Patient identified, Emergency Drugs available, Suction available and Patient being monitored Patient Re-evaluated:Patient Re-evaluated prior to induction Oxygen Delivery Method: Circle system utilized Preoxygenation: Pre-oxygenation with 100% oxygen Induction Type: IV induction Ventilation: Mask ventilation without difficulty and Oral airway inserted - appropriate to patient size Laryngoscope Size: Miller and 3 Grade View: Grade I Tube type: Oral Tube size: 7.5 mm Number of attempts: 1 Airway Equipment and Method: Patient positioned with wedge pillow and Stylet Placement Confirmation: ETT inserted through vocal cords under direct vision,  positive ETCO2 and breath sounds checked- equal and bilateral Secured at: 23 cm Tube secured with: Tape Dental Injury: Teeth and Oropharynx as per pre-operative assessment

## 2017-07-12 ENCOUNTER — Encounter (HOSPITAL_COMMUNITY): Payer: Self-pay | Admitting: Neurosurgery

## 2017-07-12 LAB — GLUCOSE, CAPILLARY: GLUCOSE-CAPILLARY: 123 mg/dL — AB (ref 65–99)

## 2017-07-12 MED ORDER — METHOCARBAMOL 500 MG PO TABS: 500.0000 mg | ORAL_TABLET | Freq: Four times a day (QID) | ORAL | 1 refills | Status: DC | PRN

## 2017-07-12 MED ORDER — OXYCODONE-ACETAMINOPHEN 5-325 MG PO TABS: 1.0000 | ORAL_TABLET | ORAL | 0 refills | Status: DC | PRN

## 2017-07-12 NOTE — Evaluation (Signed)
Occupational Therapy Evaluation/Discharge Patient Details Name: Marvin Rivas MRN: [id number] DOB: 08/18/1951 Today's Date: 07/12/2017    History of Present Illness Pt is a 66 y/o male s/p L2-4 ALIF. PMH includes TIA, TLIF X2, and arthritis   Clinical Impression   PTA, pt was independent with cane for ADL and functional mobility. Pt currently requires overall supervision for safety with standing ADL and ADL transfers. Pt educated on: clothing between brace, never sleep in brace, set an alarm at night for medication, avoid sitting for long periods of time, correct bed positioning for sleeping, correct sequence for bed mobility, avoiding lifting more than 5 pounds and never wash directly over incision. Additionally educated concerning compensatory dressing, bathing, and grooming tasks to maximize safety and independence post-acute D/C. He will have 24 hour assistance at home from his wife. All education is complete and patient indicates understanding. OT will sign off.       Follow Up Recommendations  No OT follow up;Supervision/Assistance - 24 hour    Equipment Recommendations  None recommended by OT    Recommendations for Other Services       Precautions / Restrictions Precautions Precautions: Back Precaution Booklet Issued: Yes (comment) (Provided by PT) Precaution Comments: Reviewed back precaution handout with pt. Able to verbalize 3/3 back precautions.  Required Braces or Orthoses: Spinal Brace Spinal Brace: Lumbar corset;Applied in sitting position Restrictions Weight Bearing Restrictions: No      Mobility Bed Mobility               General bed mobility comments: Sitting at EOB on OT arrival.   Transfers Overall transfer level: Needs assistance Equipment used: Straight cane Transfers: Sit to/from Stand Sit to Stand: Supervision         General transfer comment: Supervision for safety.    Balance Overall balance assessment: Needs  assistance Sitting-balance support: No upper extremity supported;Feet supported Sitting balance-Leahy Scale: Good     Standing balance support: Single extremity supported;During functional activity Standing balance-Leahy Scale: Fair Standing balance comment: Able to statically stand without UE support but relies on single point cane for dynamic activities.                            ADL either performed or assessed with clinical judgement   ADL Overall ADL's : Needs assistance/impaired Eating/Feeding: Set up;Sitting   Grooming: Supervision/safety;Standing   Upper Body Bathing: Set up;Sitting   Lower Body Bathing: Supervison/ safety;Sit to/from stand   Upper Body Dressing : Set up;Sitting   Lower Body Dressing: Supervision/safety;Sit to/from stand   Toilet Transfer: Supervision/safety;Ambulation;BSC (with single point cane) Toilet Transfer Details (indicate cue type and reason): BSC over toilet; has handicapped height toilet seats at home [place] and Hygiene: Supervision/safety;Sit to/from stand   Tub/ Shower Transfer: Supervision/safety;Ambulation;Walk-in shower;Shower seat (with cane)   Functional mobility during ADLs: Supervision/safety;Cane General ADL Comments: Able to don/doff brace safely without assistance. Pt educated concerning compensatory ADL strategies for dressing, bathing, grooming, and toileting tasks to maximize independence and adherence to back precautions during ADL participation.      Vision Patient Visual Report: No change from baseline Vision Assessment?: No apparent visual deficits     Perception     Praxis      Pertinent Vitals/Pain Pain Assessment: Faces Faces Pain Scale: Hurts little more Pain Location: back  Pain Descriptors / Indicators: Aching;Operative site guarding Pain Intervention(s): Monitored during session;Repositioned     Hand Dominance  Extremity/Trunk Assessment Upper Extremity  Assessment Upper Extremity Assessment: Overall WFL for tasks assessed   Lower Extremity Assessment Lower Extremity Assessment: Defer to PT evaluation   Cervical / Trunk Assessment Cervical / Trunk Assessment: Other exceptions Cervical / Trunk Exceptions: s/p ALIF    Communication Communication Communication: No difficulties   Cognition Arousal/Alertness: Lethargic;Suspect due to medications Behavior During Therapy: [facility] for tasks assessed/performed Overall Cognitive Status: Within Functional Limits for tasks assessed                                     General Comments       Exercises     Shoulder Instructions      Home Living Family/patient expects to be discharged to:: Private residence Living Arrangements: Spouse/significant other Available Help at Discharge: Family;Available 24 hours/day Type of Home: House Home Access: Stairs to enter [employer] of Steps: 3 Entrance Stairs-Rails: Right;Left;Can reach both Home Layout: One level     Bathroom Shower/Tub: Occupational psychologist: Handicapped height     Home Equipment: Environmental consultant - 2 wheels;Cane - single point          Prior Functioning/Environment Level of Independence: Independent with assistive device(s)        Comments: Used cane for ambulation         OT Problem List: Decreased activity tolerance;Impaired balance (sitting and/or standing);Pain;Decreased knowledge of precautions      OT Treatment/Interventions:      OT Goals(Current goals can be found in the care plan section) Acute Rehab OT Goals Patient Stated Goal: to decrease pain  OT Goal Formulation: With patient  OT Frequency:     Barriers to D/C:            Co-evaluation              AM-PAC PT "6 Clicks" Daily Activity     Outcome Measure Help from another person eating meals?: None Help from another person taking care of personal grooming?: A Little Help from another person toileting, which  includes using toliet, bedpan, or urinal?: A Little Help from another person bathing (including washing, rinsing, drying)?: A Little Help from another person to put on and taking off regular upper body clothing?: None Help from another person to put on and taking off regular lower body clothing?: A Little 6 Click Score: 20   End of Session Equipment Utilized During Treatment: Back brace (straight cane) Nurse Communication: Mobility status  Activity Tolerance: Patient tolerated treatment well Patient left: in bed;with call bell/phone within reach (sitting at EOB)  OT Visit Diagnosis: Unsteadiness on feet (R26.81)                Time: 7782-4235 OT Time Calculation (min): 8 min Charges:  OT General Charges $OT Visit: 1 Visit OT Evaluation $OT Eval Low Complexity: 1 Low G-Codes:     Norman Herrlich, MS OTR/L  Pager: [place] A Kanai Berrios 07/12/2017, 9:04 AM

## 2017-07-12 NOTE — Discharge Summary (Signed)
Physician Discharge Summary  Patient ID: Marvin Rivas MRN: [id number] DOB/AGE: [birth year] 66 y.o.  Admit date: 07/11/2017 Discharge date: 07/12/2017  Admission Diagnoses:  Spinal stenosis, Lumbar region with neurogenic claudication, lumbar spondylolisthesis, lumbago, radiculopathy     Discharge Diagnoses: Spinal stenosis, Lumbar region with neurogenic claudication, lumbar spondylolisthesis, lumbago, radiculopathy s/p L2-3 L3-4 Anteriolateral lumbar interbody fusion with posterior fixation and exploration/revision of previous fusion L4 to S1 (N/A) - L2-3 L3-4 Anteriolateral lumbar interbody fusion with posterior fixation and exploration/revision of previous fusion L4 to S1 LUMBAR PEDICLE SCREW 2 LEVEL (N/A) with posterolateral arthrodesis    Active Problems:   Spondylolisthesis of lumbar region   Discharged Condition: good  Hospital Course: [name] was admitted for surgery with dx stenosis and radiculopathy. Following uncomplicated D9-8 XLIF and exploration L4-S1 fusion/fixation L2-4, he recovered well and transferred to [facility] for nursing care and therapies. He is mobilizing well.  Consults: None  Significant Diagnostic Studies: radiology: X-Ray: intra-op  Treatments: surgery: L2-3 L3-4 Anteriolateral lumbar interbody fusion with posterior fixation and exploration/revision of previous fusion L4 to S1 (N/A) - L2-3 L3-4 Anteriolateral lumbar interbody fusion with posterior fixation and exploration/revision of previous fusion L4 to S1 LUMBAR PEDICLE SCREW 2 LEVEL (N/A) with posterolateral arthrodesis     Discharge Exam: Blood pressure (!) 108/54, pulse 79, temperature 98.6 F (37 C), temperature source Oral, resp. rate 20, height 5\' 9"  (1.753 m), weight 121.6 kg (268 lb), SpO2 100 %. Alert, conversant, smiling. Sitting in chair, reporting no pain at present. Good strength BLE. Ambulated in hallway yesterday. Hemovac in place. Incisions flat, without erythema or drainage beneath  Dermabond and honeycomb dressings.      Disposition: 01-Home or Self Care  Rolling walker, 3-in-1 elevated commode seat. Rx's Percocet 5/325 and Robaxin 500mg  to chart for prn home use. He has f/u appt scheduled.      Discharge Instructions    Diet - low sodium heart healthy    Complete by:  As directed    Increase activity slowly    Complete by:  As directed      Allergies as of 07/12/2017   No Known Allergies     Medication List    STOP taking these medications   meloxicam 15 MG tablet Commonly known as:  MOBIC     TAKE these medications   amLODipine-benazepril 10-20 MG capsule Commonly known as:  LOTREL Take 1 capsule by mouth daily.   aspirin 81 MG tablet Take 81 mg by mouth daily.   finasteride 5 MG tablet Commonly known as:  PROSCAR Take 5 mg by mouth daily.   gabapentin 600 MG tablet Commonly known as:  NEURONTIN Take 1 tablet (600 mg total) by mouth 4 (four) times daily.   losartan-hydrochlorothiazide 50-12.5 MG tablet Commonly known as:  HYZAAR Take 1 tablet by mouth daily.   methocarbamol 500 MG tablet Commonly known as:  ROBAXIN Take 1 tablet (500 mg total) by mouth every 6 (six) hours as needed for muscle spasms.   OVER THE COUNTER MEDICATION Place 2 sprays into the nose as needed (nasal congestion). [address] Nasal Spray  ([employer] brand)   oxyCODONE-acetaminophen 5-325 MG tablet Commonly known as:  PERCOCET/ROXICET Take 1-2 tablets by mouth every 4 (four) hours as needed for moderate pain.   tamsulosin 0.4 MG Caps capsule Commonly known as:  FLOMAX Take 0.4 mg by mouth daily.            Discharge Care Instructions  Start     Ordered   07/12/17 0000  oxyCODONE-acetaminophen (PERCOCET/ROXICET) 5-325 MG tablet  Every 4 hours PRN     07/12/17 0620   07/12/17 0000  methocarbamol (ROBAXIN) 500 MG tablet  Every 6 hours PRN     07/12/17 0620   07/12/17 0000  Increase activity slowly     07/12/17 0620   07/12/17 0000  Diet - low  sodium heart healthy     07/12/17 4680       Signed: Verdis Prime 07/12/2017, 8:06 AM

## 2017-07-12 NOTE — Discharge Instructions (Signed)

## 2017-07-12 NOTE — Progress Notes (Signed)
Subjective: Patient reports "I feel fine!"  Objective: Vital signs in last 24 hours: Temp:  [97 F (36.1 C)-98.6 F (37 C)] 98.6 F (37 C) (09/12 0400) Pulse Rate:  [79-100] 79 (09/12 0400) Resp:  [12-20] 20 (09/12 0400) BP: (90-117)/(46-68) 108/54 (09/12 0400) SpO2:  [94 %-100 %] 100 % (09/12 0400) Arterial Line BP: (108-137)/(54-60) 108/60 (09/11 1315)  Intake/Output from previous day: 09/11 0701 - 09/12 0700 In: 2955 [P.O.:480; I.V.:2400] Out: 2080 [Urine:1150; Drains:630; Blood:300] Intake/Output this shift: No intake/output data recorded.  Alert, conversant, smiling. Sitting in chair, reporting no pain at present. Good strength BLE. Ambulated in hallway yesterday. Hemovac in place. Incisions flat, without erythema or drainage beneath Dermabond and honeycomb dressings.   Lab Results: No results for input(s): WBC, HGB, HCT, PLT in the last 72 hours. BMET No results for input(s): NA, K, CL, CO2, GLUCOSE, BUN, CREATININE, CALCIUM in the last 72 hours.  Studies/Results: Dg Lumbar Spine 2-3 Views  Result Date: 07/11/2017 CLINICAL DATA:  Lumbar spine surgery. EXAM: DG C-ARM 61-120 MIN; LUMBAR SPINE - 2-3 VIEW COMPARISON:  MRI 04/17/2017. FINDINGS: Postsurgical changes lumbar spine. Lumbar vertebra difficult to number given fields of view. Pedicle screws and inter disc fusion devices are noted. IMPRESSION: Postsurgical changes lumbar spine. Electronically Signed   By: Marcello Moores  Register   On: 07/11/2017 11:56   Dg C-arm 61-120 Min  Result Date: 07/11/2017 CLINICAL DATA:  Lumbar spine surgery. EXAM: DG C-ARM 61-120 MIN; LUMBAR SPINE - 2-3 VIEW COMPARISON:  MRI 04/17/2017. FINDINGS: Postsurgical changes lumbar spine. Lumbar vertebra difficult to number given fields of view. Pedicle screws and inter disc fusion devices are noted. IMPRESSION: Postsurgical changes lumbar spine. Electronically Signed   By: Marcello Moores  Register   On: 07/11/2017 11:56    Assessment/Plan: Improving  LOS: 1 day   Per DrStern, d/c IV, d/c Hemovac, d/c to home. Rolling walker, 3-in-1 elevated commode seat. Rx's Percocet 5/325 and Robaxin 500mg  to chart for prn home use. He has f/u appt scheduled.    Verdis Prime 07/12/2017, 8:01 AM

## 2017-07-12 NOTE — Progress Notes (Signed)
Physical Therapy Treatment Patient Details Name: Marvin Rivas MRN: [id number] DOB: 12/21/1950 Today's Date: 07/12/2017    History of Present Illness Pt is a 66 y/o male s/p L2-4 ALIF. PMH includes TIA, TLIF X2, and arthritis    PT Comments    Pt progressing towards physical therapy goals. Was able to perform transfers and ambulation with gross supervision for safety. Pt was using cane well with no obvious losses of balance. He was cued for brace application/wearing schedule, walking program, activity progression, and general safety with maintenance of precautions. Will continue to follow and progress as able per POC.   Follow Up Recommendations  Outpatient PT;Supervision for mobility/OOB (to be set up by MD when appropriate per post-op protocol)     Equipment Recommendations  3in1 (PT)    Recommendations for Other Services       Precautions / Restrictions Precautions Precautions: Fall;Back Precaution Booklet Issued: Yes (comment) Precaution Comments: Reviewed precautions during functional mobility. Pt able to recall 3/3 with cues.  Required Braces or Orthoses: Spinal Brace Spinal Brace: Lumbar corset;Applied in sitting position Restrictions Weight Bearing Restrictions: No    Mobility  Bed Mobility               General bed mobility comments: patient sitting at EOB upon PT arrival. RN had just pulled drain and was applying dressing.  Transfers Overall transfer level: Needs assistance Equipment used: Straight cane Transfers: Sit to/from Stand Sit to Stand: Supervision         General transfer comment: Able to perform without assistive device and able to don brace in standing. Supervision for safety.  Ambulation/Gait Ambulation/Gait assistance: Supervision Ambulation Distance (Feet): 400 Feet Assistive device: Straight cane Gait Pattern/deviations: Step-through pattern;Decreased step length - right;Decreased step length - left;Decreased stride length Gait  velocity: decreased Gait velocity interpretation: Below normal speed for age/gender General Gait Details: Patient ambulates with decreased gait speed and slight trunk flexion for increased stability. Patient verbally cued to maintain neutral spine by extending trunk, in order maintain appropriate posture during ambulation.    Stairs Stairs: Yes   Stair Management: One rail Right;One rail Left;Alternating pattern Number of Stairs: 3 General stair comments: Pt was cued for sequencing and general safety.   Wheelchair Mobility    Modified Rankin (Stroke Patients Only)       Balance Overall balance assessment: Needs assistance Sitting-balance support: No upper extremity supported;Feet supported Sitting balance-Leahy Scale: Good     Standing balance support: Single extremity supported;During functional activity Standing balance-Leahy Scale: Fair Standing balance comment: Able to statically stand without UE support but relies on single point cane for dynamic activities.                             Cognition Arousal/Alertness: Awake/alert Behavior During Therapy: WFL for tasks assessed/performed Overall Cognitive Status: Within Functional Limits for tasks assessed                                        Exercises      General Comments        Pertinent Vitals/Pain Pain Assessment: Faces Faces Pain Scale: Hurts little more Pain Location: back  Pain Descriptors / Indicators: Operative site guarding;Discomfort Pain Intervention(s): Limited activity within patient's tolerance;Monitored during session;Repositioned    Home Living Family/patient expects to be discharged to:: Private residence Living Arrangements: Spouse/significant  other Available Help at Discharge: Family;Available 24 hours/day Type of Home: House Home Access: Stairs to enter Entrance Stairs-Rails: Right;Left;Can reach both Home Layout: One level Home Equipment: Environmental consultant - 2  wheels;Cane - single point      Prior Function Level of Independence: Independent with assistive device(s)      Comments: Used cane for ambulation    PT Goals (current goals can now be found in the care plan section) Acute Rehab PT Goals Patient Stated Goal: to decrease pain; home today PT Goal Formulation: With patient Time For Goal Achievement: 07/18/17 Potential to Achieve Goals: Good Progress towards PT goals: Progressing toward goals    Frequency    Min 5X/week      PT Plan Current plan remains appropriate    Co-evaluation              AM-PAC PT "6 Clicks" Daily Activity  Outcome Measure  Difficulty turning over in bed (including adjusting bedclothes, sheets and blankets)?: A Little Difficulty moving from lying on back to sitting on the side of the bed? : A Little Difficulty sitting down on and standing up from a chair with arms (e.g., wheelchair, bedside commode, etc,.)?: A Little Help needed moving to and from a bed to chair (including a wheelchair)?: A Little Help needed walking in hospital room?: A Little Help needed climbing 3-5 steps with a railing? : A Little 6 Click Score: 18    End of Session Equipment Utilized During Treatment: Gait belt;Back brace Activity Tolerance: Patient tolerated treatment well Patient left: with call bell/phone within reach (Sitting EOB in preparation for OT to enter) Nurse Communication: Mobility status PT Visit Diagnosis: Other abnormalities of gait and mobility (R26.89);Pain Pain - part of body:  (back)     Time: 2505-3976 PT Time Calculation (min) (ACUTE ONLY): 17 min  Charges:  $Gait Training: 8-22 mins                    G Codes:       [name], PT, DPT Acute Rehabilitation Services Pager: [phone]    [name] 07/12/2017, 11:34 AM

## 2017-07-12 NOTE — Progress Notes (Signed)
Patient alert and oriented, mae's well, voiding adequate amount of urine, swallowing without difficulty, no c/o pain at time of discharge. Patient discharged home with family. Script and discharged instructions given to patient. Patient and family stated understanding of instructions given. Patient has an appointment with Dr.Stern    

## 2017-07-12 NOTE — Care Management Note (Signed)
Case Management Note  Patient Details  Name: DAYLN TUGWELL MRN: [id number] Date of Birth: [birth year]  Subjective/Objective:                 Patient from home admitted for Spinal stenosis, Lumbar region with neurogenic claudication, lumbar spondylolisthesis, lumbago, radiculopathy. Plan for follow up with surgeon.    Action/Plan:   Expected Discharge Date:  07/12/17               Expected Discharge Plan:  Home/Self Care  In-House Referral:     Discharge planning Services  CM Consult  Post Acute Care Choice:    Choice offered to:     DME Arranged:    DME Agency:     HH Arranged:    HH Agency:     Status of Service:  Completed, signed off  If discussed at [employer] of Stay Meetings, dates discussed:    Additional Comments:  Carles Collet, RN 07/12/2017, 9:48 AM

## 2017-07-14 MED FILL — Sodium Chloride IV Soln 0.9%: INTRAVENOUS | Qty: 1000 | Status: AC

## 2017-07-14 MED FILL — Heparin Sodium (Porcine) Inj 1000 Unit/ML: INTRAMUSCULAR | Qty: 30 | Status: AC

## 2017-07-15 ENCOUNTER — Emergency Department (HOSPITAL_COMMUNITY): Payer: Medicare Other

## 2017-07-15 ENCOUNTER — Emergency Department (HOSPITAL_COMMUNITY)
Admission: EM | Admit: 2017-07-15 | Discharge: 2017-07-15 | Disposition: A | Discharge status: Home / Self Care | Payer: Medicare Other | Attending: Emergency Medicine | Admitting: Emergency Medicine

## 2017-07-15 ENCOUNTER — Encounter (HOSPITAL_COMMUNITY): Payer: Self-pay | Admitting: Emergency Medicine

## 2017-07-15 DIAGNOSIS — E785 Hyperlipidemia, unspecified: Secondary | ICD-10-CM | POA: Insufficient documentation

## 2017-07-15 DIAGNOSIS — E119 Type 2 diabetes mellitus without complications: Secondary | ICD-10-CM | POA: Insufficient documentation

## 2017-07-15 DIAGNOSIS — D4101 Neoplasm of uncertain behavior of right kidney: Secondary | ICD-10-CM | POA: Insufficient documentation

## 2017-07-15 DIAGNOSIS — Z87891 Personal history of nicotine dependence: Secondary | ICD-10-CM | POA: Insufficient documentation

## 2017-07-15 DIAGNOSIS — R350 Frequency of micturition: Secondary | ICD-10-CM | POA: Diagnosis present

## 2017-07-15 DIAGNOSIS — R339 Retention of urine, unspecified: Secondary | ICD-10-CM | POA: Diagnosis not present

## 2017-07-15 DIAGNOSIS — I10 Essential (primary) hypertension: Secondary | ICD-10-CM | POA: Diagnosis not present

## 2017-07-15 DIAGNOSIS — Z8673 Personal history of transient ischemic attack (TIA), and cerebral infarction without residual deficits: Secondary | ICD-10-CM | POA: Diagnosis not present

## 2017-07-15 DIAGNOSIS — Z79899 Other long term (current) drug therapy: Secondary | ICD-10-CM | POA: Insufficient documentation

## 2017-07-15 LAB — CBC WITH DIFFERENTIAL/PLATELET
Basophils Absolute: 0 10*3/uL (ref 0.0–0.1)
Basophils Relative: 0 %
EOS ABS: 0.2 10*3/uL (ref 0.0–0.7)
Eosinophils Relative: 2 %
HCT: 31.9 % — ABNORMAL LOW (ref 39.0–52.0)
HEMOGLOBIN: 10.8 g/dL — AB (ref 13.0–17.0)
LYMPHS ABS: 1.3 10*3/uL (ref 0.7–4.0)
Lymphocytes Relative: 14 %
MCH: 30.2 pg (ref 26.0–34.0)
MCHC: 33.9 g/dL (ref 30.0–36.0)
MCV: 89.1 fL (ref 78.0–100.0)
MONOS PCT: 10 %
Monocytes Absolute: 0.9 10*3/uL (ref 0.1–1.0)
NEUTROS PCT: 74 %
Neutro Abs: 6.5 10*3/uL (ref 1.7–7.7)
Platelets: 248 10*3/uL (ref 150–400)
RBC: 3.58 MIL/uL — ABNORMAL LOW (ref 4.22–5.81)
RDW: 13.7 % (ref 11.5–15.5)
WBC: 8.8 10*3/uL (ref 4.0–10.5)

## 2017-07-15 LAB — BASIC METABOLIC PANEL
Anion gap: 9 (ref 5–15)
BUN: 26 mg/dL — AB (ref 6–20)
CHLORIDE: 99 mmol/L — AB (ref 101–111)
CO2: 26 mmol/L (ref 22–32)
CREATININE: 1.52 mg/dL — AB (ref 0.61–1.24)
Calcium: 8.7 mg/dL — ABNORMAL LOW (ref 8.9–10.3)
GFR calc non Af Amer: 45 mL/min — ABNORMAL LOW (ref >60)
GFR, EST AFRICAN AMERICAN: 53 mL/min — AB (ref >60)
Glucose, Bld: 122 mg/dL — ABNORMAL HIGH (ref 65–99)
POTASSIUM: 3.8 mmol/L (ref 3.5–5.1)
Sodium: 134 mmol/L — ABNORMAL LOW (ref 135–145)

## 2017-07-15 LAB — URINALYSIS, ROUTINE W REFLEX MICROSCOPIC
BILIRUBIN URINE: NEGATIVE
Bacteria, UA: NONE SEEN
Glucose, UA: NEGATIVE mg/dL
Ketones, ur: NEGATIVE mg/dL
Leukocytes, UA: NEGATIVE
NITRITE: POSITIVE — AB
PH: 6 (ref 5.0–8.0)
Protein, ur: NEGATIVE mg/dL
SPECIFIC GRAVITY, URINE: 1.005 (ref 1.005–1.030)
Squamous Epithelial / LPF: NONE SEEN

## 2017-07-15 MED ORDER — FENTANYL CITRATE (PF) 100 MCG/2ML IJ SOLN
50.0000 ug | Freq: Once | INTRAMUSCULAR | Status: AC
  Administered 2017-07-15: 50 ug via INTRAVENOUS
  Filled 2017-07-15: qty 2

## 2017-07-15 MED ORDER — HYDROMORPHONE HCL 1 MG/ML IJ SOLN
1.0000 mg | Freq: Once | INTRAMUSCULAR | Status: AC
  Administered 2017-07-15: 1 mg via INTRAVENOUS
  Filled 2017-07-15: qty 1

## 2017-07-15 MED ORDER — DEXTROSE 5 % IV SOLN: 1.0000 g | Freq: Once | INTRAVENOUS | Status: DC

## 2017-07-15 MED ORDER — LIDOCAINE HCL 2 % EX GEL
1.0000 "application " | Freq: Once | CUTANEOUS | Status: AC
  Administered 2017-07-15: 1 via TOPICAL
  Filled 2017-07-15: qty 20

## 2017-07-15 MED ORDER — SODIUM CHLORIDE 0.9 % IV SOLN
INTRAVENOUS | Status: DC
Start: 2017-07-15 — End: 2017-07-15

## 2017-07-15 MED ORDER — SODIUM CHLORIDE 0.9 % IV BOLUS (SEPSIS)
1000.0000 mL | Freq: Once | INTRAVENOUS | Status: AC
Start: 2017-07-15 — End: 2017-07-15
  Administered 2017-07-15: 1000 mL via INTRAVENOUS

## 2017-07-15 MED ORDER — OXYBUTYNIN CHLORIDE 5 MG PO TABS
5.0000 mg | ORAL_TABLET | Freq: Once | ORAL | Status: AC
  Administered 2017-07-15: 5 mg via ORAL
  Filled 2017-07-15: qty 1

## 2017-07-15 MED ORDER — OXYBUTYNIN CHLORIDE 5 MG PO TABS: 5.0000 mg | ORAL_TABLET | Freq: Three times a day (TID) | ORAL | Status: DC

## 2017-07-15 MED ORDER — ONDANSETRON HCL 4 MG/2ML IJ SOLN
4.0000 mg | Freq: Once | INTRAMUSCULAR | Status: AC
  Administered 2017-07-15: 4 mg via INTRAVENOUS
  Filled 2017-07-15: qty 2

## 2017-07-15 NOTE — ED Triage Notes (Signed)
Pt presents from home with urinary frequency that began yesterday; pt reports recent back surgery tuesday

## 2017-07-15 NOTE — ED Notes (Signed)
Patient transported to CT 

## 2017-07-15 NOTE — Discharge Instructions (Signed)
You were seen and evaluated today for urinary complaint. This resolved with placement of a Foley catheter. The foley catheter will remain in place until you were evaluated by the urologist. Please call the urology office as soon as possible to set up an appointment.  There is also an abnormal mass noted on the right kidney. This will need to be further assessed, likely with MRI. This may be able to be done through your PCP or the urologist.  Return to the ED for new or worsening symptoms.

## 2017-07-15 NOTE — ED Provider Notes (Signed)
[facility] DEPT Provider Note   CSN: [id number] Arrival date & time: 07/15/17  0556     History   Chief Complaint Chief Complaint  Patient presents with  . Urinary Frequency    HPI Marvin Rivas is a 66 y.o. male.  HPI   Marvin Rivas is a 66 y.o. male, with a history of HTN, morbid obesity, TIA, polyneuropathy, DM, recent lumbar spinal surgery, presenting to the ED with urinary frequency and discomfort beginning last night. Patient had lumbar spinal surgery on September 11 (see below). Endorses intermittent suprapubic pain, sharp, 10/10 when it occurs, radiating into the penis. Also has burning with urination. He has had this issue before following foley catheter removal and states it feels like bladder spasms and maybe a UTI. He has been able to urinate, however, the frequency has significantly increased to where he cannot stay away from the bathroom for more than 15-20 minutes. He states he has had some urinary leakage at baseline, however, states that this is normal for him.  He notes he did not begin to have bowel movements until last night and this was only after taking 4 doses of MiraLAX. He has been taking Pyridium for his urinary discomfort. Patient adds that he took his blood pressure last night and found his pressure to be 70 systolic. He denies complaints associated with this.   Denies fever/chills, nausea/vomiting, increased back pain, acute urinary incontinence, stool incontinence, acute numbness, weakness, saddle anesthesias, penile discharge, or any other complaints.     Patient had procedure performed on 07/11/17 by Dr. Vertell Limber: PRE-OPERATIVE DIAGNOSIS:  Spinal stenosis, Lumbar region with neurogenic claudication, lumbar spondylolisthesis, lumbago, radiculopathy POST-OPERATIVE DIAGNOSIS:  Spinal stenosis, Lumbar region with neurogenic claudication, lumbar spondylolisthesis, lumbago, radiculopathy PROCEDURE:  Procedure(s) with comments: L2-3 L3-4 Anteriolateral  lumbar interbody fusion with posterior fixation and exploration/revision of previous fusion L4 to S1 (N/A) - L2-3 L3-4 Anteriolateral lumbar interbody fusion with posterior fixation and exploration/revision of previous fusion L4 to S1 LUMBAR PEDICLE SCREW 2 LEVEL (N/A) with posterolateral arthrodesis   Past Medical History:  Diagnosis Date  . Abnormality of gait 01/26/2015  . Arthritis   . Carpal tunnel syndrome on both sides   . Diabetes mellitus without complication ([place])    controlled with diet  . Dyslipidemia   . GERD (gastroesophageal reflux disease)   . Glaucoma    pt states he had a 2nd opthalmologist opinion and was told he did not have glaucoma  . Hypertension   . Morbid obesity ([place])   . Neuropathy   . Neuropathy    legs  . OSA on CPAP   . Polyneuropathy in diabetes(357.2) 01/23/2014  . TIA (transient ischemic attack)    ?2013    Patient Active Problem List   Diagnosis Date Noted  . Spondylolisthesis of lumbar region 07/11/2017  . Abnormality of gait 01/26/2015  . Diabetic polyneuropathy ([place]) 01/23/2014  . Slurred speech 12/21/2012  . Lethargy 12/21/2012  . BPH (benign prostatic hyperplasia) 12/21/2012  . Diabetes mellitus without complication ([place])   . Hypertension     Past Surgical History:  Procedure Laterality Date  . ANTERIOR LAT LUMBAR FUSION N/A 07/11/2017   Procedure: L2-3 L3-4 Anteriolateral lumbar interbody fusion with posterior fixation and exploration/revision of previous fusion L4 to S1;  Surgeon: Erline Levine, MD;  Location: [facility];  Service: Neurosurgery;  Laterality: N/A;  L2-3 L3-4 Anteriolateral lumbar interbody fusion with posterior fixation and exploration/revision of previous fusion L4 to S1  . BACK SURGERY    .  COLONOSCOPY    . LUMBAR PERCUTANEOUS PEDICLE SCREW 2 LEVEL N/A 07/11/2017   Procedure: LUMBAR PERCUTANEOUS PEDICLE SCREW 2 LEVEL;  Surgeon: Erline Levine, MD;  Location: [facility];  Service: Neurosurgery;  Laterality: N/A;  . NASAL SEPTUM  SURGERY    . SPINAL FUSION    . toenail removed     both big toenails removed as a child       Home Medications    Prior to Admission medications   Medication Sig Start Date End Date Taking? Authorizing Provider  amLODipine-benazepril (LOTREL) 10-20 MG per capsule Take 1 capsule by mouth daily.   Yes [provider]  aspirin 81 MG tablet Take 81 mg by mouth daily.   Yes [provider]  finasteride (PROSCAR) 5 MG tablet Take 5 mg by mouth daily.   Yes [provider]  gabapentin (NEURONTIN) 600 MG tablet Take 1 tablet (600 mg total) by mouth 4 (four) times daily. 06/01/17  Yes Dennie Bible, NP  losartan-hydrochlorothiazide (HYZAAR) 50-12.5 MG tablet Take 1 tablet by mouth daily.  04/22/17  Yes [provider]  nitrofurantoin, macrocrystal-monohydrate, (MACROBID) 100 MG capsule Take 100 mg by mouth once.   Yes [provider]  omeprazole (PRILOSEC) 20 MG capsule Take 20 mg by mouth daily. 07/05/17  Yes [provider]  OVER THE COUNTER MEDICATION Place 2 sprays into the nose as needed (nasal congestion). [address] Nasal Spray  ([employer] brand)    Yes [provider]  phenazopyridine (PYRIDIUM) 100 MG tablet Take 100 mg by mouth once.   Yes [provider]  Tamsulosin HCl (FLOMAX) 0.4 MG CAPS Take 0.4 mg by mouth daily.    Yes [provider]  tiZANidine (ZANAFLEX) 4 MG tablet Take 4 mg by mouth every 6 (six) hours as needed for muscle spasms. 07/12/17  Yes [provider]  methocarbamol (ROBAXIN) 500 MG tablet Take 1 tablet (500 mg total) by mouth every 6 (six) hours as needed for muscle spasms. Patient not taking: Reported on 07/15/2017 07/12/17   Erline Levine, MD  oxyCODONE-acetaminophen (PERCOCET/ROXICET) 5-325 MG tablet Take 1-2 tablets by mouth every 4 (four) hours as needed for moderate pain. Patient not taking: Reported on 07/15/2017 07/12/17   Erline Levine, MD    Family History Family  History  Problem Relation Age of Onset  . Prostate cancer Father   . Hypertension Mother     Social History Social History  Substance Use Topics  . Smoking status: Former Smoker    Quit date: 01/24/1999  . Smokeless tobacco: Never Used  . Alcohol use No     Allergies   Patient has no known allergies.   Review of Systems Review of Systems  Constitutional: Negative for chills and fever.  Respiratory: Negative for shortness of breath.   Cardiovascular: Negative for chest pain.  Gastrointestinal: Positive for abdominal pain. Negative for nausea and vomiting.  Genitourinary: Positive for dysuria and frequency. Negative for decreased urine volume, difficulty urinating and discharge.  Neurological: Negative for weakness and numbness.  All other systems reviewed and are negative.    Physical Exam Updated Vital Signs BP 119/69   Pulse 75   Temp 97.7 F (36.5 C) (Oral)   Resp 20   SpO2 100%   Physical Exam  Constitutional: He appears well-developed and well-nourished. No distress.  HENT:  Head: Normocephalic and atraumatic.  Eyes: Conjunctivae are normal.  Neck: Neck supple.  Cardiovascular: Normal rate, regular rhythm, normal heart sounds and intact distal  pulses.   Pulmonary/Chest: Effort normal and breath sounds normal. No respiratory distress.  Abdominal: Soft. There is tenderness in the suprapubic area. There is no guarding.  Genitourinary:  Genitourinary Comments: Rectal tone is intact and appears to be normal. No gross blood or stool burden. No rectal tenderness. Prostate is nontender.   Penis, scrotum, and testicles without swelling, lesions, or tenderness. No penile discharge.  No inguinal hernia noted. Cremasteric reflex intact. No inguinal lymphadenopathy. Overall normal male genitalia. RN, Clarise Cruz, served as chaperone during the exam.  Musculoskeletal: He exhibits no edema or tenderness.  Full motor function present in lower extremities. No significant tenderness  noted to the patient's back or flank.  Lymphadenopathy:    He has no cervical adenopathy.  Neurological: He is alert.  No noted acute sensory deficits. No noted saddle anesthesias. Strength 5/5 with flexion and extension of the hips, knees, and ankles bilaterally.  Patellar DTRs 2+ bilaterally Patient is able to ambulate without assistance or noted difficulty. Coordination intact including heel to shin and finger to nose.   Skin: Skin is warm and dry. Capillary refill takes less than 2 seconds. He is not diaphoretic.  Incisions on the patient's back with no noted surrounding erythema, swelling, or exudate.  Psychiatric: He has a normal mood and affect. His behavior is normal.  Nursing note and vitals reviewed.    ED Treatments / Results  Labs (all labs ordered are listed, but only abnormal results are displayed) Labs Reviewed  URINALYSIS, ROUTINE W REFLEX MICROSCOPIC - Abnormal; Notable for the following:       Result Value   Color, Urine AMBER (*)    Hgb urine dipstick SMALL (*)    Nitrite POSITIVE (*)    All other components within normal limits  BASIC METABOLIC PANEL - Abnormal; Notable for the following:    Sodium 134 (*)    Chloride 99 (*)    Glucose, Bld 122 (*)    BUN 26 (*)    Creatinine, Ser 1.52 (*)    Calcium 8.7 (*)    GFR calc non Af Amer 45 (*)    GFR calc Af Amer 53 (*)    All other components within normal limits  CBC WITH DIFFERENTIAL/PLATELET - Abnormal; Notable for the following:    RBC 3.58 (*)    Hemoglobin 10.8 (*)    HCT 31.9 (*)    All other components within normal limits  URINE CULTURE    EKG  EKG Interpretation None       Radiology Ct Renal Stone Study  Result Date: 07/15/2017 CLINICAL DATA:  Back surgery July 11, 2017.  Increasing pain. EXAM: CT ABDOMEN AND PELVIS WITHOUT CONTRAST TECHNIQUE: Multidetector CT imaging of the abdomen and pelvis was performed following the standard protocol without IV contrast. COMPARISON:  None.  FINDINGS: Lower chest: No acute abnormality. Hepatobiliary: Tiny low-attenuation lesion in the liver on series 5, image 12 is nonspecific but likely a tiny cyst. The liver and gallbladder are otherwise normal. Pancreas: Unremarkable. No pancreatic ductal dilatation or surrounding inflammatory changes. Spleen: Normal in size without focal abnormality. Adrenals/Urinary Tract: There is a mass off the lower pole of the right kidney with an attenuation of 30 Hounsfield units measuring up to 6.2 cm. An exophytic mass off the left kidney demonstrates an attenuation of less than 10 Hounsfield units, consistent with a cyst. No other solid masses are seen. Mild perinephric stranding bilaterally is symmetric and likely largely chronic. At least 2 small stones are seen  in the left kidney. At least 3 stones are seen in the right kidney with an upper pole representative stone measuring 9 mm. No hydronephrosis. The ureters are symmetric with no stones. There is a stone in the posterior aspect of the bladder measuring 15 mm which is age indeterminate. The bladder is otherwise normal. Stomach/Bowel: The stomach and small bowel are normal. The colon is normal as is the appendix. Vascular/Lymphatic: No significant vascular findings are present. No enlarged abdominal or pelvic lymph nodes. Reproductive: Prostate is unremarkable. Other: There is air within the right psoas muscle and in the right posterior retroperitoneum, likely from recent surgery. Postoperative changes are seen posteriorly adjacent to the spine. There is stranding adjacent to the right psoas muscle extending superiorly, likely postoperative in nature. More focal stranding is seen posterior to the duodenum and anterior to the right kidney on series 5, image 37. This may be tracking from the postsurgical stranding and is still located in the retroperitoneal space although more anteriorly located. None of the adjacent structures appear to be inflamed including the  duodenum, gallbladder, pancreas, or kidney. No abscess identified on this unenhanced study. Limited views of the spinal canal demonstrate no obvious hematoma. Musculoskeletal: Postsurgical changes are seen in the spine. Hardware is intact and in good position. IMPRESSION: 1. 6.2 cm right renal mass highly suggestive of a renal cell carcinoma. An MRI could further assess. However, if possible, the MRI should be performed as an outpatient as emergency room patient's often have difficulty complying with MRI directions resulting in limited studies. 2. Air and stranding in and adjacent to the right psoas muscle, extending superiorly in the retroperitoneum is likely postsurgical. Postsurgical patients can of course develop infections as well but the degree of findings is not definitely abnormal 4 days after surgery. Recommend close clinical follow-up and follow-up imaging if clinically warranted. 3. More focal stranding between the duodenum and the right kidney, located within the anterior aspect of the retroperitoneum, is most likely associated with postoperative change. The adjacent structures do not appear to be inflamed. 4. Bilateral renal stones with no hydronephrosis. No ureteral stones. The stone in the bladder is age-indeterminate. Electronically Signed   By: Dorise Bullion III M.D   On: 07/15/2017 10:02    Procedures Procedures (including critical care time)  Medications Ordered in ED Medications  sodium chloride 0.9 % bolus 1,000 mL (0 mLs Intravenous Stopped 07/15/17 1034)  fentaNYL (SUBLIMAZE) injection 50 mcg (50 mcg Intravenous Given 07/15/17 0717)  ondansetron (ZOFRAN) injection 4 mg (4 mg Intravenous Given 07/15/17 0717)  oxybutynin (DITROPAN) tablet 5 mg (5 mg Oral Given 07/15/17 0734)  HYDROmorphone (DILAUDID) injection 1 mg (1 mg Intravenous Given 07/15/17 0751)  lidocaine (XYLOCAINE) 2 % jelly 1 application (1 application Topical Given 07/15/17 1034)     Initial Impression / Assessment and  Plan / ED Course  I have reviewed the triage vital signs and the nursing notes.  Pertinent labs & imaging results that were available during my care of the patient were reviewed by me and considered in my medical decision making (see chart for details).  Clinical Course as of Jul 15 1653  Sat Jul 15, 2017  0740 Patient states his pain has not improved with the Fentanyl. Urine is flowing into condom cath bag.  [SJ]  [phone] Patient states that his pain has not improved at all and states that it may have worsened. He complains of burning pain from just inside the base of the penis to the  tip of the penis. Urine is still noted to be accumulating in the collection bag.  [SJ]  1021 Patient's bladder scan showed >9108mL in bladder. Patient noted to have voided a total of about 400cc through the condom catheter.   [SJ]  1025 Foley catheter was placed and patient had immediate relief in his pain.  [SJ]  24 Spoke with Dr. Annette Stable, neurosurgeon on call. States the stranding on the CT is normal following the type of surgery the patient underwent. Suspects the retention is more likely due to prostatic outflow obstruction than neurologic. Recommends Flomax and follow up with urology.  [SJ]  1047 Spoke with Dr. Jess Barters, urology resident on call. Agrees with keeping foley catheter in place and office follow up. Patient can follow up with Dr. Karsten Ro.  [SJ]  1140 Patient states that he feels "great." Denies any pain. Patient ambulated without difficulty or assistance. No hypotension noted.  [SJ]    Clinical Course User Index [SJ] Rhodes Calvert C, PA-C    Patient presents with complaint of urinary frequency. Low suspicion for UTI based on UA results. I do not think that the patient's complaint today is neurologic in nature. Found to have urinary retention. Foley catheter placed and patient had full relief in his symptoms.  Foley catheter care education performed by RN. Urology follow-up. The patient was given  instructions for home care as well as return precautions. Patient voices understanding of these instructions, accepts the plan, and is comfortable with discharge.  Findings and plan of care discussed with [facility], DO. Dr. Leonides Schanz personally evaluated and examined this patient.  Vitals:   07/15/17 1030 07/15/17 1100 07/15/17 1122 07/15/17 1218  BP: (!) 135/59 124/60 (!) 106/49 (!) 115/48  Pulse: (!) 57 (!) 51 (!) 57 (!) 57  Resp:  16 17 16   Temp:    98.6 F (37 C)  TempSrc:    Oral  SpO2: 97% 100%  99%     Final Clinical Impressions(s) / ED Diagnoses   Final diagnoses:  Urinary retention    New Prescriptions Discharge Medication List as of 07/15/2017 12:15 PM       Lorayne Bender, PA-C 07/15/17 1656

## 2017-07-15 NOTE — ED Notes (Signed)
Pt states that his pain is a 0 after the foley was inserted ,

## 2017-07-15 NOTE — ED Provider Notes (Signed)
Medical screening examination/treatment/procedure(s) were conducted as a shared visit with non-physician practitioner(s) and myself.  I personally evaluated the patient during the encounter.   EKG Interpretation None      Patient is a 66 year old male who underwent lumbar fusion secondary to spinal stenosis on September 11 with Dr. Vertell Limber who had a Foley catheter for the procedure presents with dysuria, urinary frequency and bladder retention. This feels similar to previous urinary tract infections he has had after Foley catheter has been placed. Has had hypotension at home. No fever. States his back is actually feeling great. He has no numbness, weakness, bowel or bladder incontinence or urinary retention. Nontoxic-appearing here but does appear uncomfortable. Abdominal exam benign. Back appears normal. Normal neurologic exam.  Urine shows nitrites but he did take Azo prior to arrival. No bacteria and no other obvious sign of infection. Given fentanyl, Zofran, IV fluids and Ditropan for symptomatic relief.  Pt underwent CT imaging for further eval.  CT showed right renal mass - will have pt follow up as outpatient.  Post surgical changes noted. 14mm stone seen in bladder.  Urinary retention found on bladder scan.  Foley catheter placed. NSG and Urology consulted. Both felt pt could be followed as outpt.  Dr. Annette Stable did not think urinary retention was NSG issue.  Pt dc'ed in stable condition.   Tayja Manzer, Delice Bison, DO 07/17/17 1025

## 2017-07-15 NOTE — ED Notes (Signed)
Wife adds that she gave him one of her left over antibiotic pills and pyridium

## 2017-07-15 NOTE — ED Notes (Addendum)
Ambulated Pt through hallway with use of cane. Pt tolerated well with steady gait. Returned to bed and is resting comfortably.

## 2017-07-17 LAB — URINE CULTURE: Culture: NO GROWTH

## 2017-08-21 ENCOUNTER — Other Ambulatory Visit: Payer: Self-pay | Admitting: Urology

## 2017-09-04 NOTE — Progress Notes (Signed)
LOV neuro Evlyn Courier , [place] 06-01-17 epic   EKG 07-05-17 epic

## 2017-09-04 NOTE — Patient Instructions (Addendum)
Marvin Rivas  09/04/2017   Your procedure is scheduled on: 09-14-17  Report to [facility] Main  Entrance Take [place]  elevators to 3rd floor to  [place] at [phone].    Call this number if you have problems the morning of surgery [phone]    Remember: ONLY 1 PERSON MAY GO WITH YOU TO SHORT STAY TO GET  READY MORNING OF YOUR SURGERY.  Do not eat food or drink liquids :After Midnight.     Take these medicines the morning of surgery with A SIP OF WATER: tylenol if needed, finasteride(proscar), tamsulosin(flomax), gabapentin, ranitidine(zantac)                                 You may not have any metal on your body including hair pins and              piercings  Do not wear jewelry, make-up, lotions, powders or perfumes, deodorant                     Men may shave face and neck.   Do not bring valuables to the hospital. [place].  Contacts, dentures or bridgework may not be worn into surgery.  Leave suitcase in the car. After surgery it may be brought to your room.                 Please read over the following fact sheets you were given: _____________________________________________________________________  How to Manage Your Diabetes Before and After Surgery  Why is it important to control my blood sugar before and after surgery? . Improving blood sugar levels before and after surgery helps healing and can limit problems. . A way of improving blood sugar control is eating a healthy diet by: o  Eating less sugar and carbohydrates o  Increasing activity/exercise o  Talking with your doctor about reaching your blood sugar goals . High blood sugars (greater than 180 mg/dL) can raise your risk of infections and slow your recovery, so you will need to focus on controlling your diabetes during the weeks before surgery. . Make sure that the doctor who takes care of your diabetes knows about your  planned surgery including the date and location.  How do I manage my blood sugar before surgery? . Check your blood sugar at least 4 times a day, starting 2 days before surgery, to make sure that the level is not too high or low. o Check your blood sugar the morning of your surgery when you wake up and every 2 hours until you get to the Short Stay unit. . If your blood sugar is less than 70 mg/dL, you will need to treat for low blood sugar: o Do not take insulin. o Treat a low blood sugar (less than 70 mg/dL) with  cup of clear juice (cranberry or apple), 4 glucose tablets, OR glucose gel. o Recheck blood sugar in 15 minutes after treatment (to make sure it is greater than 70 mg/dL). If your blood sugar is not greater than 70 mg/dL on recheck, call [phone] for further instructions. . Report your blood sugar to the short stay nurse when you get to Short Stay.  . If you are  admitted to the hospital after surgery: o Your blood sugar will be checked by the staff and you will probably be given insulin after surgery (instead of oral diabetes medicines) to make sure you have good blood sugar levels. o The goal for blood sugar control after surgery is 80-180 mg/dL.     Patient Signature:  Date:   Nurse Signature:  Date:   Reviewed and Endorsed by [facility] Patient Education Committee, August 2015            [facility] - Preparing for Surgery Before surgery, you can play an important role.  Because skin is not sterile, your skin needs to be as free of germs as possible.  You can reduce the number of germs on your skin by washing with CHG (chlorahexidine gluconate) soap before surgery.  CHG is an antiseptic cleaner which kills germs and bonds with the skin to continue killing germs even after washing. Please DO NOT use if you have an allergy to CHG or antibacterial soaps.  If your skin becomes reddened/irritated stop using the CHG and inform your nurse when you arrive at Short Stay. Do  not shave (including legs and underarms) for at least 48 hours prior to the first CHG shower.  You may shave your face/neck. Please follow these instructions carefully:  1.  Shower with CHG Soap the night before surgery and the  morning of Surgery.  2.  If you choose to wash your hair, wash your hair first as usual with your  normal  shampoo.  3.  After you shampoo, rinse your hair and body thoroughly to remove the  shampoo.                           4.  Use CHG as you would any other liquid soap.  You can apply chg directly  to the skin and wash                       Gently with a scrungie or clean washcloth.  5.  Apply the CHG Soap to your body ONLY FROM THE NECK DOWN.   Do not use on face/ open                           Wound or open sores. Avoid contact with eyes, ears mouth and genitals (private parts).                       Wash face,  Genitals (private parts) with your normal soap.             6.  Wash thoroughly, paying special attention to the area where your surgery  will be performed.  7.  Thoroughly rinse your body with warm water from the neck down.  8.  DO NOT shower/wash with your normal soap after using and rinsing off  the CHG Soap.                9.  Pat yourself dry with a clean towel.            10.  Wear clean pajamas.            11.  Place clean sheets on your bed the night of your first shower and do not  sleep with pets. Day of Surgery : Do not apply any lotions/deodorants the morning of  surgery.  Please wear clean clothes to the hospital/surgery center.  FAILURE TO FOLLOW THESE INSTRUCTIONS MAY RESULT IN THE CANCELLATION OF YOUR SURGERY PATIENT SIGNATURE_________________________________  NURSE SIGNATURE__________________________________  ________________________________________________________________________   [name]  An incentive spirometer is a tool that can help keep your lungs clear and active. This tool measures how well you are filling your  lungs with each breath. Taking long deep breaths may help reverse or decrease the chance of developing breathing (pulmonary) problems (especially infection) following:  A long period of time when you are unable to move or be active. BEFORE THE PROCEDURE   If the spirometer includes an indicator to show your best effort, your nurse or respiratory therapist will set it to a desired goal.  If possible, sit up straight or lean slightly forward. Try not to slouch.  Hold the incentive spirometer in an upright position. INSTRUCTIONS FOR USE  1. Sit on the edge of your bed if possible, or sit up as far as you can in bed or on a chair. 2. Hold the incentive spirometer in an upright position. 3. Breathe out normally. 4. Place the mouthpiece in your mouth and seal your lips tightly around it. 5. Breathe in slowly and as deeply as possible, raising the piston or the ball toward the top of the column. 6. Hold your breath for 3-5 seconds or for as long as possible. Allow the piston or ball to fall to the bottom of the column. 7. Remove the mouthpiece from your mouth and breathe out normally. 8. Rest for a few seconds and repeat Steps 1 through 7 at least 10 times every 1-2 hours when you are awake. Take your time and take a few normal breaths between deep breaths. 9. The spirometer may include an indicator to show your best effort. Use the indicator as a goal to work toward during each repetition. 10. After each set of 10 deep breaths, practice coughing to be sure your lungs are clear. If you have an incision (the cut made at the time of surgery), support your incision when coughing by placing a pillow or rolled up towels firmly against it. Once you are able to get out of bed, walk around indoors and cough well. You may stop using the incentive spirometer when instructed by your caregiver.  RISKS AND COMPLICATIONS  Take your time so you do not get dizzy or light-headed.  If you are in pain, you may need  to take or ask for pain medication before doing incentive spirometry. It is harder to take a deep breath if you are having pain. AFTER USE  Rest and breathe slowly and easily.  It can be helpful to keep track of a log of your progress. Your caregiver can provide you with a simple table to help with this. If you are using the spirometer at home, follow these instructions: [place] IF:   You are having difficultly using the spirometer.  You have trouble using the spirometer as often as instructed.  Your pain medication is not giving enough relief while using the spirometer.  You develop fever of 100.5 F (38.1 C) or higher. SEEK IMMEDIATE MEDICAL CARE IF:   You cough up bloody sputum that had not been present before.  You develop fever of 102 F (38.9 C) or greater.  You develop worsening pain at or near the incision site. MAKE SURE YOU:   Understand these instructions.  Will watch your condition.  Will get help right away if  you are not doing well or get worse. Document Released: 02/27/2007 Document Revised: 01/09/2012 Document Reviewed: 04/30/2007 ExitCare Patient Information 2014 ExitCare, [place].   ________________________________________________________________________  WHAT IS A BLOOD TRANSFUSION? Blood Transfusion Information  A transfusion is the replacement of blood or some of its parts. Blood is made up of multiple cells which provide different functions.  Red blood cells carry oxygen and are used for blood loss replacement.  White blood cells fight against infection.  Platelets control bleeding.  Plasma helps clot blood.  Other blood products are available for specialized needs, such as hemophilia or other clotting disorders. BEFORE THE TRANSFUSION  Who gives blood for transfusions?   Healthy volunteers who are fully evaluated to make sure their blood is safe. This is blood bank blood. Transfusion therapy is the safest it has ever been in the  practice of medicine. Before blood is taken from a donor, a complete history is taken to make sure that person has no history of diseases nor engages in risky social behavior (examples are intravenous drug use or sexual activity with multiple partners). The donor's travel history is screened to minimize risk of transmitting infections, such as malaria. The donated blood is tested for signs of infectious diseases, such as HIV and hepatitis. The blood is then tested to be sure it is compatible with you in order to minimize the chance of a transfusion reaction. If you or a relative donates blood, this is often done in anticipation of surgery and is not appropriate for emergency situations. It takes many days to process the donated blood. RISKS AND COMPLICATIONS Although transfusion therapy is very safe and saves many lives, the main dangers of transfusion include:   Getting an infectious disease.  Developing a transfusion reaction. This is an allergic reaction to something in the blood you were given. Every precaution is taken to prevent this. The decision to have a blood transfusion has been considered carefully by your caregiver before blood is given. Blood is not given unless the benefits outweigh the risks. AFTER THE TRANSFUSION  Right after receiving a blood transfusion, you will usually feel much better and more energetic. This is especially true if your red blood cells have gotten low (anemic). The transfusion raises the level of the red blood cells which carry oxygen, and this usually causes an energy increase.  The nurse administering the transfusion will monitor you carefully for complications. HOME CARE INSTRUCTIONS  No special instructions are needed after a transfusion. You may find your energy is better. Speak with your caregiver about any limitations on activity for underlying diseases you may have. SEEK MEDICAL CARE IF:   Your condition is not improving after your transfusion.  You  develop redness or irritation at the intravenous (IV) site. SEEK IMMEDIATE MEDICAL CARE IF:  Any of the following symptoms occur over the next 12 hours:  Shaking chills.  You have a temperature by mouth above 102 F (38.9 C), not controlled by medicine.  Chest, back, or muscle pain.  People around you feel you are not acting correctly or are confused.  Shortness of breath or difficulty breathing.  Dizziness and fainting.  You get a rash or develop hives.  You have a decrease in urine output.  Your urine turns a dark color or changes to pink, red, or brown. Any of the following symptoms occur over the next 10 days:  You have a temperature by mouth above 102 F (38.9 C), not controlled by medicine.  Shortness of breath.  Weakness after normal activity.  The white part of the eye turns yellow (jaundice).  You have a decrease in the amount of urine or are urinating less often.  Your urine turns a dark color or changes to pink, red, or brown. Document Released: 10/14/2000 Document Revised: 01/09/2012 Document Reviewed: 06/02/2008 [facility] Patient Information 2014 [place], [place].  _______________________________________________________________________

## 2017-09-07 ENCOUNTER — Other Ambulatory Visit: Payer: Self-pay

## 2017-09-07 ENCOUNTER — Encounter (HOSPITAL_COMMUNITY)
Admission: RE | Admit: 2017-09-07 | Discharge: 2017-09-07 | Disposition: A | Discharge status: Home / Self Care | Payer: Medicare Other | Source: Ambulatory Visit | Attending: Urology | Admitting: Urology

## 2017-09-07 ENCOUNTER — Encounter (HOSPITAL_COMMUNITY): Payer: Self-pay

## 2017-09-07 DIAGNOSIS — C641 Malignant neoplasm of right kidney, except renal pelvis: Secondary | ICD-10-CM | POA: Diagnosis not present

## 2017-09-07 DIAGNOSIS — Z01818 Encounter for other preprocedural examination: Secondary | ICD-10-CM | POA: Diagnosis not present

## 2017-09-07 DIAGNOSIS — E119 Type 2 diabetes mellitus without complications: Secondary | ICD-10-CM | POA: Insufficient documentation

## 2017-09-07 HISTORY — DX: Neoplasm of unspecified behavior of unspecified kidney: D49.519

## 2017-09-07 LAB — BASIC METABOLIC PANEL
Anion gap: 6 (ref 5–15)
BUN: 17 mg/dL (ref 6–20)
CHLORIDE: 106 mmol/L (ref 101–111)
CO2: 30 mmol/L (ref 22–32)
Calcium: 9.2 mg/dL (ref 8.9–10.3)
Creatinine, Ser: 1.08 mg/dL (ref 0.61–1.24)
GFR calc non Af Amer: >60 mL/min (ref >60)
Glucose, Bld: 100 mg/dL — ABNORMAL HIGH (ref 65–99)
POTASSIUM: 4.3 mmol/L (ref 3.5–5.1)
SODIUM: 142 mmol/L (ref 135–145)

## 2017-09-07 LAB — CBC
HEMATOCRIT: 39.4 % (ref 39.0–52.0)
Hemoglobin: 12.8 g/dL — ABNORMAL LOW (ref 13.0–17.0)
MCH: 29.4 pg (ref 26.0–34.0)
MCHC: 32.5 g/dL (ref 30.0–36.0)
MCV: 90.6 fL (ref 78.0–100.0)
Platelets: 212 10*3/uL (ref 150–400)
RBC: 4.35 MIL/uL (ref 4.22–5.81)
RDW: 13.9 % (ref 11.5–15.5)
WBC: 5.1 10*3/uL (ref 4.0–10.5)

## 2017-09-07 LAB — HEMOGLOBIN A1C
Hgb A1c MFr Bld: 5.1 % (ref 4.8–5.6)
Mean Plasma Glucose: 99.67 mg/dL

## 2017-09-07 LAB — ABO/RH: ABO/RH(D): A POS

## 2017-09-13 MED ORDER — DEXTROSE 5 % IV SOLN
3.0000 g | INTRAVENOUS | Status: AC
  Administered 2017-09-14 (×2): 3 g via INTRAVENOUS
  Filled 2017-09-13: qty 3

## 2017-09-13 NOTE — Anesthesia Preprocedure Evaluation (Addendum)
Anesthesia Evaluation  Patient identified by MRN, date of birth, ID band Patient awake    Reviewed: Allergy & Precautions, NPO status , Patient's Chart, lab work & pertinent test results  Airway Mallampati: I  TM Distance: >3 FB Neck ROM: Full    Dental  (+) Dental Advisory Given, Teeth Intact   Pulmonary sleep apnea and Continuous Positive Airway Pressure Ventilation , neg COPD, former smoker,    breath sounds clear to auscultation       Cardiovascular hypertension, Pt. on medications (-) Past MI  Rhythm:Regular Rate:Normal  1st degree AV block. RBBB   Neuro/Psych TIAnegative psych ROS   GI/Hepatic Neg liver ROS, GERD  Medicated and Controlled,  Endo/Other  diabetes, Well Controlled, Type 2Morbid obesity  Renal/GU Renal diseaseRight renal neoplasm     Musculoskeletal  (+) Arthritis ,   Abdominal (+) + obese,   Peds  Hematology negative hematology ROS (+)   Anesthesia Other Findings   Reproductive/Obstetrics                           Lab Results  Component Value Date   WBC 5.1 09/07/2017   HGB 12.8 (L) 09/07/2017   HCT 39.4 09/07/2017   MCV 90.6 09/07/2017   PLT 212 09/07/2017   Lab Results  Component Value Date   CREATININE 1.08 09/07/2017   BUN 17 09/07/2017   NA 142 09/07/2017   K 4.3 09/07/2017   CL 106 09/07/2017   CO2 30 09/07/2017    Anesthesia Physical  Anesthesia Plan  ASA: III  Anesthesia Plan: General   Post-op Pain Management:    Induction: Intravenous  PONV Risk Score and Plan: 3 and Ondansetron, Dexamethasone, Midazolam and Treatment may vary due to age or medical condition  Airway Management Planned: Oral ETT  Additional Equipment: None  Intra-op Plan:   Post-operative Plan: Extubation in OR  Informed Consent: I have reviewed the patients History and Physical, chart, labs and discussed the procedure including the risks, benefits and alternatives for  the proposed anesthesia with the patient or authorized representative who has indicated his/her understanding and acceptance.   Dental advisory given  Plan Discussed with: CRNA  Anesthesia Plan Comments:         Anesthesia Quick Evaluation

## 2017-09-13 NOTE — H&P (Signed)
1. Right renal neoplasm  2. BPH with history of urinary retention  3. Elevated PSA  4. Urolithiasis  5. Bladder calculus    Marvin Rivas had a CT scan of the abdomen and pelvis with and without IV contrast performed on 08/04/17. I independently reviewed this CT scan and it demonstrated a clearly enhancing 6.0 x 4.5 cm lower pole right renal tumor highly suspicious for renal malignancy. There are additional smaller right renal lesions consistent with simple cysts. There do not appear to be any concerning left renal lesions. He does have a lower pole right renal calculus. There is no evidence of regional lymphadenopathy, renal vein involvement, or other evidence of metastatic disease to the abdomen. He also had a CT scan of the chest demonstrates no pulmonary nodules or other findings that would be suspicious for metastatic disease.   With regard to his recent urinary retention, he did again developed urinary retention since last visit with me. However, he was able to undergo a successful voiding trial last week. He follows up today with a PVR. He currently is taking finasteride 5 mg and has increased his tamsulosin 0.8 mg. Subjectively, he is voiding well. He does have a known bladder calculus that is relatively small at 15 mm.   He was seen by Dr. Vertell Limber yesterday for postoperative evaluation following his recent lumbar fusion approximately one month ago. He feels that he is doing quite well and has recovered uneventfully.     ALLERGIES: No Allergies    MEDICATIONS: Omeprazole 20 mg capsule,delayed release  Tamsulosin Hcl 0.4 mg capsule, ext release 24 hr 1 capsule PO BID  Aspir 81  Finasteride 5 MG Oral Tablet Oral  Gabapentin 600 mg tablet Oral  Losartan Potassium-HCTZ 100-25 MG Oral Tablet Oral  Meloxicam 15 mg tablet  Oxycodone-Acetaminophen 5 mg-325 mg tablet  Tamsulosin HCl - 0.4 MG Oral Capsule 1 Oral Daily  Tizanidine Hcl 4 mg capsule     GU PSH: Locm 300-399Mg /Ml Iodine,1Ml -  08/04/2017      PSH Notes: Exploration Of Spinal Fusion, Back Surgery   NON-GU PSH: Back Surgery (Unspecified) Explore Spinal Fusion - 2011    GU PMH: Urinary Retention (Stable) - 07/24/2017, - 07/21/2017 Right renal neoplasm - 07/21/2017 BPH w/LUTS, Benign prostatic hyperplasia with urinary obstruction - 2014 BPH w/o LUTS, Benign prostatic hypertrophy without lower urinary tract symptoms - 2014 CIS of prostate/prostatic urethra, PIN III (prostatic intraepithelial neoplasia III) - 2014 Dysuria, Dysuria - 2014 ED due to arterial insufficiency, Erectile dysfunction due to arterial insufficiency - 2014 Elevated PSA, Elevated prostate specific antigen (PSA) - 2014, Elevated prostate specific antigen (PSA), - 2014 History of urolithiasis, Nephrolithiasis - 2014 Nocturia, Nocturia - 2014 Urinary Urgency, Urinary urgency - 2014      PMH Notes:   ** His medical history is significant for diabetes, hypertension, sleep apnea (he uses CPAP), and acid reflux and a history of a TIA multiple years ago. He also has multiple issues related to arthritis and has had multiple lumbar back surgeries including lumbar back surgery with what sounds like a fusion performed on 07/11/17.   1) BPH/LUTS: After lumbar back surgery in September 2018, he developed acute urinary retention and presented to the emergency department on 07/15/17 with overflow incontinence and urinary frequency. He had a urinary catheter placed that relieved his discomfort immediately.   Current treatment: Finasteride and tamsulosin   2) Right renal neoplasm: He did have a CT scan of the abdomen and pelvis performed without  contrast when he presented to the ED in September 2018 for urinary retention. This did raise concern for a lower pole right renal neoplasm that was not consistent with a simple cyst. This measured 6.2 cm and was concerning for a possible renal malignancy. He has not had any hematuria and has no family history of kidney cancer.  His baseline serum creatinine is 1.5.   3) Elevated PSA: He has a long-standing history of an elevated PSA and BPH. 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 He has a strong family history of prostate cancer with his father having been diagnosed in his late 61s. His father was treated with primary radiation therapy and developed recurrence and ultimately died in his late 73s of metastatic prostate cancer. His PSA has since fluctuated. This was last checked in 2014 when it was noted to be just over 4 although he was on finasteride at that time. He has been quite reluctant to consider a repeat biopsy.   4) Urolithiasis: He does have nonobstructing bilateral urolithiasis noted on his CT scan from 07/15/17. He has not been symptomatic related to the stones.     NON-GU PMH: Arthritis Diabetes Type 2 GERD Glaucoma Hypercholesterolemia Hypertension Sleep Apnea Stroke/TIA    FAMILY HISTORY: 1 Daughter - Daughter 1 son - Son Death In The Family Father - Runs In Family Death In The Family Mother - Runs In Family Family Health Status Number - Runs In Family nephrolithiasis - Father Prostate Cancer - Father   SOCIAL HISTORY: Marital Status: Married Preferred Language: English Current Smoking Status: Patient does not smoke anymore.   Tobacco Use Assessment Completed: Used Tobacco in last 30 days? Drinks 4+ caffeinated drinks per day. Patient's occupation is/was Retired.     Notes: Former smoker, Occupation:, Alcohol Use, Marital History - Currently Married, Caffeine Use   REVIEW OF SYSTEMS:    GU Review Male:   Patient denies frequent urination, hard to postpone urination, burning/ pain with urination, get up at night to urinate, leakage of urine, stream starts and stops, trouble starting your streams, and have to strain to urinate .  Gastrointestinal (Lower):   Patient denies  diarrhea and constipation.  Gastrointestinal (Upper):   Patient denies nausea and vomiting.  Constitutional:   Patient denies fever, night sweats, weight loss, and fatigue.  Skin:   Patient denies skin rash/ lesion and itching.  Eyes:   Patient denies blurred vision and double vision.  Ears/ Nose/ Throat:   Patient denies sore throat and sinus problems.  Hematologic/Lymphatic:   Patient denies swollen glands and easy bruising.  Cardiovascular:   Patient denies leg swelling and chest pains.  Respiratory:   Patient denies cough and shortness of breath.  Endocrine:   Patient denies excessive thirst.  Musculoskeletal:   Patient reports back pain. Patient denies joint pain.  Neurological:   Patient denies headaches and dizziness.  Psychologic:   Patient denies depression and anxiety.     MULTI-SYSTEM PHYSICAL EXAMINATION:    Constitutional: Well-nourished. No physical deformities. Normally developed. Good grooming.  Neck: Neck symmetrical, not swollen. Normal tracheal position.  Respiratory: No labored breathing, no use of accessory muscles. Clear bilaterally.  Cardiovascular: Normal temperature, normal extremity pulses, no swelling, no varicosities. Regular rate and rhythm.  Lymphatic: No enlargement of neck, axillae, groin.  Skin: No paleness, no jaundice, no cyanosis. No lesion, no ulcer, no rash.  Neurologic /  Psychiatric: Oriented to time, oriented to place, oriented to person. No depression, no anxiety, no agitation.  Gastrointestinal: Obese, no masses, nontender, no abdominal incisions.  Eyes: Normal conjunctivae. Normal eyelids.  Ears, Nose, Mouth, and Throat: Left ear no scars, no lesions, no masses. Right ear no scars, no lesions, no masses. Nose no scars, no lesions, no masses. Normal hearing. Normal lips.  Musculoskeletal: Normal gait and station of head and neck.     PROCEDURES:          PVR Ultrasound - 09326  Scanned Volume: 136 cc   ASSESSMENT:      ICD-10 Details  1  GU:   Right renal neoplasm - D49.511   2   Urinary Retention - R33.8    PLAN:           Notes:   1. Right renal neoplasm suspicious for renal malignancy: He does not have any evidence of metastatic disease.  The patient was provided information regarding their renal mass including the relative risk of benign versus malignant pathology and the natural history of renal cell carcinoma and other possible malignancies of the kidney. The role of renal biopsy, laboratory testing, and imaging studies to further characterize renal masses and/or the presence of metastatic disease were explained. We discussed the role of active surveillance, surgical therapy with both radical nephrectomy and nephron-sparing surgery, and ablative therapy in the treatment of renal masses. In addition, we discussed our goals of providing an accurate diagnosis and oncologic control while maintaining optimal renal function as appropriate based on the size, location, and complexity of their renal mass as well as their co-morbidities.   We have discussed the risks of treatment in detail including but not limited to bleeding, infection, heart attack, stroke, death, venothromoboembolism, cancer recurrence, injury/damage to surrounding organs and structures, urine leak, the possibility of open surgical conversion for patients undergoing minimally invasive surgery, the risk of developing chronic kidney disease and its associated implications, and the potential risk of end stage renal disease possibly necessitating dialysis.   I did recommend that he proceed with surgical treatment of his right renal mass. Although it is a decent size mass, its location in the lower pole of the right kidney would appear to make it amenable to nephron sparing surgery. I have recommended and he is agreeable to proceed with a right robot-assisted laparoscopic partial nephrectomy. I will confirm with Dr. Vertell Limber that this would be appropriate to proceed with  following his recent lumbar fusion. Tentatively, surgery will be scheduled for a minimum of 6 weeks after his recent fusion. This will be delayed further if this is felt necessary per Dr. Vertell Limber.   2. BPH/urinary retention: He will continue finasteride 5 mg daily and tamsulosin 0.8 mg daily. He appears to be emptying adequately today and subjectively voiding well. He understands that he will be at risk for postoperative retention again. If this continues to be an ongoing issue and considering his bladder calculus, he may ultimately require more aggressive therapy such as TURP.   3. Elevated PSA: Considering his recent catheterizations, we will not plan to recheck his PSA currently. This will be followed up on in the future.   4. Urolithiasis: His stone disease is nonobstructing and asymptomatic.

## 2017-09-14 ENCOUNTER — Encounter (HOSPITAL_COMMUNITY): Payer: Self-pay | Admitting: Emergency Medicine

## 2017-09-14 ENCOUNTER — Other Ambulatory Visit: Payer: Self-pay

## 2017-09-14 ENCOUNTER — Inpatient Hospital Stay (HOSPITAL_COMMUNITY): Payer: Medicare Other | Admitting: Certified Registered Nurse Anesthetist

## 2017-09-14 ENCOUNTER — Encounter (HOSPITAL_COMMUNITY)
Admission: RE | Disposition: A | Discharge status: Home / Self Care | Payer: Self-pay | Source: Home / Self Care | Attending: Urology

## 2017-09-14 ENCOUNTER — Inpatient Hospital Stay (HOSPITAL_COMMUNITY)
Admission: RE | Admit: 2017-09-14 | Discharge: 2017-09-16 | DRG: 658 | Disposition: A | Discharge status: Home / Self Care | Payer: Medicare Other | Attending: Urology | Admitting: Urology

## 2017-09-14 DIAGNOSIS — H04123 Dry eye syndrome of bilateral lacrimal glands: Secondary | ICD-10-CM | POA: Diagnosis present

## 2017-09-14 DIAGNOSIS — N209 Urinary calculus, unspecified: Secondary | ICD-10-CM | POA: Diagnosis present

## 2017-09-14 DIAGNOSIS — Z8042 Family history of malignant neoplasm of prostate: Secondary | ICD-10-CM | POA: Diagnosis not present

## 2017-09-14 DIAGNOSIS — N2889 Other specified disorders of kidney and ureter: Secondary | ICD-10-CM | POA: Diagnosis present

## 2017-09-14 DIAGNOSIS — Z6839 Body mass index (BMI) 39.0-39.9, adult: Secondary | ICD-10-CM

## 2017-09-14 DIAGNOSIS — Z87891 Personal history of nicotine dependence: Secondary | ICD-10-CM | POA: Diagnosis not present

## 2017-09-14 DIAGNOSIS — Z791 Long term (current) use of non-steroidal anti-inflammatories (NSAID): Secondary | ICD-10-CM | POA: Diagnosis not present

## 2017-09-14 DIAGNOSIS — N400 Enlarged prostate without lower urinary tract symptoms: Secondary | ICD-10-CM | POA: Diagnosis present

## 2017-09-14 DIAGNOSIS — C641 Malignant neoplasm of right kidney, except renal pelvis: Secondary | ICD-10-CM | POA: Diagnosis present

## 2017-09-14 DIAGNOSIS — Z79899 Other long term (current) drug therapy: Secondary | ICD-10-CM

## 2017-09-14 DIAGNOSIS — Z79891 Long term (current) use of opiate analgesic: Secondary | ICD-10-CM | POA: Diagnosis not present

## 2017-09-14 DIAGNOSIS — D49511 Neoplasm of unspecified behavior of right kidney: Secondary | ICD-10-CM | POA: Diagnosis present

## 2017-09-14 HISTORY — PX: ROBOTIC ASSITED PARTIAL NEPHRECTOMY: SHX6087

## 2017-09-14 LAB — TYPE AND SCREEN
ABO/RH(D): A POS
Antibody Screen: NEGATIVE

## 2017-09-14 LAB — GLUCOSE, CAPILLARY
GLUCOSE-CAPILLARY: 91 mg/dL (ref 65–99)
Glucose-Capillary: 173 mg/dL — ABNORMAL HIGH (ref 65–99)

## 2017-09-14 LAB — HEMOGLOBIN AND HEMATOCRIT, BLOOD
HEMATOCRIT: 41.2 % (ref 39.0–52.0)
HEMOGLOBIN: 13.7 g/dL (ref 13.0–17.0)

## 2017-09-14 SURGERY — ROBOTIC ASSITED PARTIAL NEPHRECTOMY
Anesthesia: General | Laterality: Right

## 2017-09-14 MED ORDER — DEXAMETHASONE SODIUM PHOSPHATE 10 MG/ML IJ SOLN
INTRAMUSCULAR | Status: DC | PRN
  Administered 2017-09-14: 4 mg via INTRAVENOUS

## 2017-09-14 MED ORDER — PROPOFOL 10 MG/ML IV BOLUS
INTRAVENOUS | Status: AC
  Filled 2017-09-14: qty 20

## 2017-09-14 MED ORDER — ACETAMINOPHEN 10 MG/ML IV SOLN
1000.0000 mg | Freq: Four times a day (QID) | INTRAVENOUS | Status: AC
  Administered 2017-09-14 – 2017-09-15 (×4): 1000 mg via INTRAVENOUS
  Filled 2017-09-14 (×4): qty 100

## 2017-09-14 MED ORDER — ACETAMINOPHEN 325 MG PO TABS: 650.0000 mg | ORAL_TABLET | ORAL | Status: DC | PRN

## 2017-09-14 MED ORDER — ROCURONIUM BROMIDE 50 MG/5ML IV SOSY
PREFILLED_SYRINGE | INTRAVENOUS | Status: DC | PRN
Start: 2017-09-14 — End: 2017-09-14
  Administered 2017-09-14: 50 mg via INTRAVENOUS
  Administered 2017-09-14 (×2): 10 mg via INTRAVENOUS
  Administered 2017-09-14: 5 mg via INTRAVENOUS
  Administered 2017-09-14 (×2): 10 mg via INTRAVENOUS

## 2017-09-14 MED ORDER — DEXTROSE-NACL 5-0.45 % IV SOLN
INTRAVENOUS | Status: DC
  Administered 2017-09-14 – 2017-09-15 (×2): via INTRAVENOUS

## 2017-09-14 MED ORDER — BSS IO SOLN
30.0000 mL | INTRAOCULAR | Status: DC | PRN
  Administered 2017-09-14: 5 mL
  Filled 2017-09-14: qty 30

## 2017-09-14 MED ORDER — LIDOCAINE 2% (20 MG/ML) 5 ML SYRINGE
INTRAMUSCULAR | Status: AC
  Filled 2017-09-14: qty 5

## 2017-09-14 MED ORDER — FENTANYL CITRATE (PF) 100 MCG/2ML IJ SOLN
25.0000 ug | INTRAMUSCULAR | Status: DC | PRN
  Administered 2017-09-14 (×3): 50 ug via INTRAVENOUS

## 2017-09-14 MED ORDER — ROCURONIUM BROMIDE 50 MG/5ML IV SOSY
PREFILLED_SYRINGE | INTRAVENOUS | Status: AC
Start: 2017-09-14 — End: 2017-09-14
  Filled 2017-09-14: qty 5

## 2017-09-14 MED ORDER — MIDAZOLAM HCL 2 MG/2ML IJ SOLN
INTRAMUSCULAR | Status: AC
  Filled 2017-09-14: qty 2

## 2017-09-14 MED ORDER — ROCURONIUM BROMIDE 50 MG/5ML IV SOSY
PREFILLED_SYRINGE | INTRAVENOUS | Status: AC
  Filled 2017-09-14: qty 5

## 2017-09-14 MED ORDER — OXYCODONE HCL 5 MG PO TABS
5.0000 mg | ORAL_TABLET | ORAL | Status: DC | PRN
  Administered 2017-09-15: 5 mg via ORAL
  Filled 2017-09-14: qty 1

## 2017-09-14 MED ORDER — BUPIVACAINE LIPOSOME 1.3 % IJ SUSP
20.0000 mL | Freq: Once | INTRAMUSCULAR | Status: AC
  Administered 2017-09-14: 20 mL
  Filled 2017-09-14: qty 20

## 2017-09-14 MED ORDER — TAMSULOSIN HCL 0.4 MG PO CAPS
0.4000 mg | ORAL_CAPSULE | Freq: Every day | ORAL | Status: DC
  Administered 2017-09-15: 0.4 mg via ORAL
  Filled 2017-09-14 (×2): qty 1

## 2017-09-14 MED ORDER — FENTANYL CITRATE (PF) 100 MCG/2ML IJ SOLN
INTRAMUSCULAR | Status: AC
  Filled 2017-09-14: qty 4

## 2017-09-14 MED ORDER — FENTANYL CITRATE (PF) 100 MCG/2ML IJ SOLN
50.0000 ug | Freq: Once | INTRAMUSCULAR | Status: AC
  Administered 2017-09-14: 50 ug via INTRAVENOUS

## 2017-09-14 MED ORDER — ONDANSETRON HCL 4 MG/2ML IJ SOLN
INTRAMUSCULAR | Status: DC | PRN
  Administered 2017-09-14: 4 mg via INTRAVENOUS

## 2017-09-14 MED ORDER — SODIUM CHLORIDE 0.9 % IJ SOLN
INTRAMUSCULAR | Status: AC
  Filled 2017-09-14: qty 50

## 2017-09-14 MED ORDER — SUCCINYLCHOLINE CHLORIDE 200 MG/10ML IV SOSY
PREFILLED_SYRINGE | INTRAVENOUS | Status: AC
  Filled 2017-09-14: qty 10

## 2017-09-14 MED ORDER — LACTATED RINGERS IV SOLN
INTRAVENOUS | Status: DC | PRN
  Administered 2017-09-14 (×4): via INTRAVENOUS

## 2017-09-14 MED ORDER — ONDANSETRON HCL 4 MG/2ML IJ SOLN: 4.0000 mg | Freq: Once | INTRAMUSCULAR | Status: DC | PRN

## 2017-09-14 MED ORDER — SUCCINYLCHOLINE CHLORIDE 200 MG/10ML IV SOSY
PREFILLED_SYRINGE | INTRAVENOUS | Status: DC | PRN
  Administered 2017-09-14: 140 mg via INTRAVENOUS

## 2017-09-14 MED ORDER — FINASTERIDE 5 MG PO TABS
5.0000 mg | ORAL_TABLET | Freq: Every day | ORAL | Status: DC
  Administered 2017-09-15: 5 mg via ORAL
  Filled 2017-09-14 (×2): qty 1

## 2017-09-14 MED ORDER — EPHEDRINE SULFATE-NACL 50-0.9 MG/10ML-% IV SOSY
PREFILLED_SYRINGE | INTRAVENOUS | Status: DC | PRN
  Administered 2017-09-14 (×2): 15 mg via INTRAVENOUS

## 2017-09-14 MED ORDER — PROPOFOL 10 MG/ML IV BOLUS
INTRAVENOUS | Status: DC | PRN
  Administered 2017-09-14: 200 mg via INTRAVENOUS

## 2017-09-14 MED ORDER — PHENYLEPHRINE 40 MCG/ML (10ML) SYRINGE FOR IV PUSH (FOR BLOOD PRESSURE SUPPORT)
PREFILLED_SYRINGE | INTRAVENOUS | Status: AC
Start: 2017-09-14 — End: 2017-09-14
  Filled 2017-09-14: qty 10

## 2017-09-14 MED ORDER — EPHEDRINE 5 MG/ML INJ
INTRAVENOUS | Status: AC
Start: 2017-09-14 — End: 2017-09-14
  Filled 2017-09-14: qty 10

## 2017-09-14 MED ORDER — ONDANSETRON HCL 4 MG/2ML IJ SOLN: 4.0000 mg | INTRAMUSCULAR | Status: DC | PRN

## 2017-09-14 MED ORDER — MORPHINE SULFATE (PF) 4 MG/ML IV SOLN
2.0000 mg | INTRAVENOUS | Status: DC | PRN
  Administered 2017-09-14: 4 mg via INTRAVENOUS
  Filled 2017-09-14: qty 1

## 2017-09-14 MED ORDER — CEFAZOLIN SODIUM 10 G IJ SOLR
3.0000 g | Freq: Once | INTRAMUSCULAR | Status: DC
  Filled 2017-09-14 (×2): qty 3000

## 2017-09-14 MED ORDER — SUGAMMADEX SODIUM 500 MG/5ML IV SOLN
INTRAVENOUS | Status: DC | PRN
  Administered 2017-09-14: 300 mg via INTRAVENOUS

## 2017-09-14 MED ORDER — PHENYLEPHRINE 40 MCG/ML (10ML) SYRINGE FOR IV PUSH (FOR BLOOD PRESSURE SUPPORT)
PREFILLED_SYRINGE | INTRAVENOUS | Status: DC | PRN
  Administered 2017-09-14: 120 ug via INTRAVENOUS
  Administered 2017-09-14 (×2): 80 ug via INTRAVENOUS
  Administered 2017-09-14: 120 ug via INTRAVENOUS

## 2017-09-14 MED ORDER — LIDOCAINE 2% (20 MG/ML) 5 ML SYRINGE
INTRAMUSCULAR | Status: DC | PRN
  Administered 2017-09-14: 100 mg via INTRAVENOUS

## 2017-09-14 MED ORDER — FENTANYL CITRATE (PF) 100 MCG/2ML IJ SOLN
INTRAMUSCULAR | Status: AC
  Filled 2017-09-14: qty 2

## 2017-09-14 MED ORDER — SUGAMMADEX SODIUM 500 MG/5ML IV SOLN
INTRAVENOUS | Status: AC
  Filled 2017-09-14: qty 5

## 2017-09-14 MED ORDER — SODIUM CHLORIDE 0.9 % IJ SOLN
INTRAMUSCULAR | Status: DC | PRN
  Administered 2017-09-14: 20 mL

## 2017-09-14 MED ORDER — DEXAMETHASONE SODIUM PHOSPHATE 10 MG/ML IJ SOLN
INTRAMUSCULAR | Status: AC
  Filled 2017-09-14: qty 1

## 2017-09-14 MED ORDER — MIDAZOLAM HCL 5 MG/5ML IJ SOLN
INTRAMUSCULAR | Status: DC | PRN
  Administered 2017-09-14: 2 mg via INTRAVENOUS

## 2017-09-14 MED ORDER — OXYCODONE-ACETAMINOPHEN 5-325 MG PO TABS: 1.0000 | ORAL_TABLET | Freq: Four times a day (QID) | ORAL | 0 refills | Status: DC | PRN

## 2017-09-14 MED ORDER — LACTATED RINGERS IR SOLN
Status: DC | PRN
  Administered 2017-09-14: 1000 mL

## 2017-09-14 MED ORDER — LACTATED RINGERS IV SOLN: INTRAVENOUS | Status: DC

## 2017-09-14 MED ORDER — ONDANSETRON HCL 4 MG/2ML IJ SOLN
INTRAMUSCULAR | Status: AC
  Filled 2017-09-14: qty 2

## 2017-09-14 MED ORDER — FENTANYL CITRATE (PF) 100 MCG/2ML IJ SOLN
INTRAMUSCULAR | Status: DC | PRN
  Administered 2017-09-14 (×3): 50 ug via INTRAVENOUS
  Administered 2017-09-14: 100 ug via INTRAVENOUS
  Administered 2017-09-14 (×2): 50 ug via INTRAVENOUS

## 2017-09-14 MED ORDER — FENTANYL CITRATE (PF) 250 MCG/5ML IJ SOLN
INTRAMUSCULAR | Status: AC
  Filled 2017-09-14: qty 5

## 2017-09-14 SURGICAL SUPPLY — 59 items
ADH SKN CLS APL DERMABOND .7 (GAUZE/BANDAGES/DRESSINGS) ×1
AGENT HMST KT MTR STRL THRMB (HEMOSTASIS)
APL ESCP 34 STRL LF DISP (HEMOSTASIS) ×1
APPLICATOR SURGIFLO ENDO (HEMOSTASIS) ×2 IMPLANT
BAG LAPAROSCOPIC 12 15 PORT 16 (BASKET) IMPLANT
BAG RETRIEVAL 12/15 (BASKET) ×2
BAG RETRIEVAL 12/15MM (BASKET) ×1
BAG SPEC RTRVL LRG 6X4 10 (ENDOMECHANICALS)
CHLORAPREP W/TINT 26ML (MISCELLANEOUS) ×3 IMPLANT
CLIP VESOLOCK LG 6/CT PURPLE (CLIP) ×3 IMPLANT
CLIP VESOLOCK MED LG 6/CT (CLIP) ×8 IMPLANT
COVER SURGICAL LIGHT HANDLE (MISCELLANEOUS) ×3 IMPLANT
COVER TIP SHEARS 8 DVNC (MISCELLANEOUS) ×1 IMPLANT
COVER TIP SHEARS 8MM DA VINCI (MISCELLANEOUS) ×2
DECANTER SPIKE VIAL GLASS SM (MISCELLANEOUS) ×3 IMPLANT
DERMABOND ADVANCED (GAUZE/BANDAGES/DRESSINGS) ×2
DERMABOND ADVANCED .7 DNX12 (GAUZE/BANDAGES/DRESSINGS) ×2 IMPLANT
DRAIN CHANNEL 15F RND FF 3/16 (WOUND CARE) ×3 IMPLANT
DRAPE ARM DVNC X/XI (DISPOSABLE) ×4 IMPLANT
DRAPE COLUMN DVNC XI (DISPOSABLE) ×1 IMPLANT
DRAPE DA VINCI XI ARM (DISPOSABLE) ×8
DRAPE DA VINCI XI COLUMN (DISPOSABLE) ×2
DRAPE INCISE IOBAN 66X45 STRL (DRAPES) ×3 IMPLANT
DRAPE SHEET LG 3/4 BI-LAMINATE (DRAPES) ×3 IMPLANT
ELECT PENCIL ROCKER SW 15FT (MISCELLANEOUS) ×3 IMPLANT
ELECT REM PT RETURN 15FT ADLT (MISCELLANEOUS) ×3 IMPLANT
EVACUATOR SILICONE 100CC (DRAIN) ×3 IMPLANT
FLOSEAL 10ML (HEMOSTASIS) ×2 IMPLANT
GLOVE BIO SURGEON STRL SZ 6.5 (GLOVE) ×2 IMPLANT
GLOVE BIO SURGEONS STRL SZ 6.5 (GLOVE) ×1
GLOVE BIOGEL M STRL SZ7.5 (GLOVE) ×8 IMPLANT
GOWN STRL REUS W/TWL LRG LVL3 (GOWN DISPOSABLE) ×11 IMPLANT
HEMOSTAT SURGICEL 4X8 (HEMOSTASIS) IMPLANT
IRRIG SUCT STRYKERFLOW 2 WTIP (MISCELLANEOUS) ×3
IRRIGATION SUCT STRKRFLW 2 WTP (MISCELLANEOUS) ×1 IMPLANT
KIT BASIN OR (CUSTOM PROCEDURE TRAY) ×3 IMPLANT
MARKER SKIN DUAL TIP RULER LAB (MISCELLANEOUS) ×2 IMPLANT
POSITIONER SURGICAL ARM (MISCELLANEOUS) ×4 IMPLANT
POUCH SPECIMEN RETRIEVAL 10MM (ENDOMECHANICALS) ×1 IMPLANT
SEAL CANN UNIV 5-8 DVNC XI (MISCELLANEOUS) ×4 IMPLANT
SEAL XI 5MM-8MM UNIVERSAL (MISCELLANEOUS) ×8
SOLUTION ELECTROLUBE (MISCELLANEOUS) ×3 IMPLANT
SURGIFLO W/THROMBIN 8M KIT (HEMOSTASIS) ×1 IMPLANT
SUT ETHILON 3 0 PS 1 (SUTURE) ×3 IMPLANT
SUT MNCRL AB 4-0 PS2 18 (SUTURE) ×6 IMPLANT
SUT PDS AB 1 CT1 27 (SUTURE) ×4 IMPLANT
SUT V-LOC BARB 180 2/0GR6 GS22 (SUTURE) ×3
SUT VIC AB 0 CT1 27 (SUTURE) ×3
SUT VIC AB 0 CT1 27XBRD ANTBC (SUTURE) ×1 IMPLANT
SUT VICRYL 0 UR6 27IN ABS (SUTURE) IMPLANT
SUT VLOC BARB 180 ABS3/0GR12 (SUTURE) ×6
SUTURE V-LC BRB 180 2/0GR6GS22 (SUTURE) ×1 IMPLANT
SUTURE VLOC BRB 180 ABS3/0GR12 (SUTURE) ×1 IMPLANT
TOWEL OR 17X26 10 PK STRL BLUE (TOWEL DISPOSABLE) ×3 IMPLANT
TRAY FOLEY W/METER SILVER 16FR (SET/KITS/TRAYS/PACK) ×3 IMPLANT
TRAY LAPAROSCOPIC (CUSTOM PROCEDURE TRAY) ×3 IMPLANT
TROCAR BLADELESS OPT 5 100 (ENDOMECHANICALS) IMPLANT
TROCAR XCEL 12X100 BLDLESS (ENDOMECHANICALS) ×3 IMPLANT
WATER STERILE IRR 1000ML POUR (IV SOLUTION) ×3 IMPLANT

## 2017-09-14 NOTE — Progress Notes (Signed)
Post-op note  Subjective: The patient is doing well.  No complaints except dry eyes.  No N/V.  Objective: Vital signs in last 24 hours: Temp:  [98 F (36.7 C)-98.4 F (36.9 C)] 98.3 F (36.8 C) (11/15 1335) Pulse Rate:  [75-96] 95 (11/15 1335) Resp:  [15-18] 15 (11/15 1335) BP: (130-151)/(69-88) 147/76 (11/15 1335) SpO2:  [100 %] 100 % (11/15 1335) Weight:  [121.1 kg (267 lb)] 121.1 kg (267 lb) (11/15 0538)  Intake/Output from previous day: No intake/output data recorded. Intake/Output this shift: Total I/O In: 3700 [I.V.:3700] Out: 1580 [Urine:650; Drains:30; Other:600; Blood:300]  Physical Exam:  General: Alert and oriented. Left eye slightly red  Abdomen: Soft, Nondistended. Incisions: Clean and dry. Urine: clear  Lab Results: Recent Labs    09/14/17 1239  HGB 13.7  HCT 41.2    Assessment/Plan: POD#0   1) Continue to monitor  2) DVT prophy, clears, IS, bed rest tonight, pain control  3) Saline drops for eyes    LOS: 0 days   Merilyn Pagan 09/14/2017, 2:20 PM

## 2017-09-14 NOTE — Progress Notes (Signed)
Post-op note  Subjective: The patient is doing well.  No complaints.  Objective: Vital signs in last 24 hours: Temp:  [98 F (36.7 C)-98.4 F (36.9 C)] 98.3 F (36.8 C) (11/15 1335) Pulse Rate:  [75-96] 95 (11/15 1335) Resp:  [15-18] 15 (11/15 1335) BP: (130-151)/(69-88) 147/76 (11/15 1335) SpO2:  [100 %] 100 % (11/15 1335) Weight:  [121.1 kg (267 lb)] 121.1 kg (267 lb) (11/15 0538)  Intake/Output from previous day: No intake/output data recorded. Intake/Output this shift: Total I/O In: 3857.5 [P.O.:120; I.V.:3737.5] Out: 1580 [Urine:650; Drains:30; Other:600; Blood:300]  Physical Exam:  General: Alert and oriented. Abdomen: Soft, Nondistended, incision c/d/i. JP ss Incisions: Clean and dry. GU: foley catheter to drainage, clear  Lab Results: Recent Labs    09/14/17 1239  HGB 13.7  HCT 41.2    Assessment/Plan: POD#0 s/p L pNx. Doing well. hgb stable   1) Continue to monitor   Alla Feeling, MD, MD   LOS: 0 days   Alla Feeling, MD 09/14/2017, 4:34 PM

## 2017-09-14 NOTE — Plan of Care (Signed)
Pt is tolerating pain medication regimen.

## 2017-09-14 NOTE — Progress Notes (Signed)
Left eye sclera noted to be red, burning and painful.  Dr Fransisco Beau notified and in to see patient.  Eye drops ordered and given for comfort.

## 2017-09-14 NOTE — Op Note (Signed)
Preoperative diagnosis: Right renal neoplasm  Postoperative diagnosis: Right renal neolasm  Procedure:  1. Right robotic-assisted laparoscopic partial nephrectomy 2. Intraoperative renal ultrasonography  Surgeon: Pryor Curia. M.D.  Assistant(s): Debbrah Alar, PA-C  An assistant was required for this surgical procedure.  The duties of the assistant included but were not limited to suctioning, passing suture, camera manipulation, retraction. This procedure would not be able to be performed without an Environmental consultant.  Resident: Dr. Fredrik Rigger  Anesthesia: General  Complications: None  EBL: 300 mL  IVF:  2100 mL crystalloid  Specimens: 1. Right renal neoplasm  Disposition of specimens: Pathology  Intraoperative findings:       1. Warm renal ischemia time: 21 minutes       2. Intraoperative renal ultrasound findings: A 6 cm heterogenous solid mass was noted off the lower pole of the right kidney.  Drains: 1. # 15 Blake perinephric drain  Indication:  Marvin Rivas is a 66 y.o. year old patient with a right renal mass.  After a thorough review of the management options for their renal mass, they elected to proceed with surgical treatment and the above procedure.  We have discussed the potential benefits and risks of the procedure, side effects of the proposed treatment, the likelihood of the patient achieving the goals of the procedure, and any potential problems that might occur during the procedure or recuperation. Informed consent has been obtained.   Description of procedure:  The patient was taken to the operating room and a general anesthetic was administered. The patient was given preoperative antibiotics, placed in the right modified flank position with care to pad all potential pressure points, and prepped and draped in the usual sterile fashion. Next a preoperative timeout was performed.  A site was selected on in the midline for placement of the assistant  port. This was placed using a standard open Hassan technique which allowed entry into the peritoneal cavity under direct vision and without difficulty. A 12 mm port was placed and a pneumoperitoneum established. The camera was then used to inspect the abdomen and there was no evidence of any intra-abdominal injuries or other abnormalities. The remaining abdominal ports were then placed. 8 mm robotic ports were placed in the right upper quadrant, right lower quadrant, and far right lateral abdominal wall. An 8 mm port was placed for the camera site just right of the umbilicus. All ports were placed under direct vision without difficulty. The surgical cart was then docked.   Utilizing the cautery scissors, the white line of Toldt was incised allowing the colon to be mobilized medially and the plane between the mesocolon and the anterior layer of Gerota's fascia to be developed and the kidney to be exposed.  The ureter and gonadal vein were identified inferiorly and the ureter was lifted anteriorly off the psoas muscle.  Dissection proceeded superiorly along the gonadal vein until the renal vein was identified.  The renal hilum was then carefully isolated with a combination of blunt and sharp dissection allowing the renal arterial and venous structures to be separated and isolated in preparation for renal hilar vessel clamping. There were two renal arteries noted (early branching renal artery on imaging) and two renal veins.    Attention turned to the kidney and the perinephric fat surrounding the renal mass was removed and the kidney was mobilized sufficiently for exposure and resection of the renal mass.  The perinephric fat was extremely adherent to the renal capsule and was not  easily removed to expose the base of the tumor.  Intraoperative renal ultrasonography was utilized with the laparoscopic ultrasound probe to identify the renal tumor and identify the tumor margins. This allowed the margin of resection  to be marked.  It was apparently that the majority of the lower pole would need to be amputated for safe tumor excision.   Once the renal mass was properly isolated, preparations were made for resection of the tumor.  Reconstructive sutures were placed into the abdomen for the renorrhaphy portion of the procedure.  Both renal arteries were then clamped with bulldog clamps.  The tumor was then excised with cold scissor dissection along with an adequate visible gross margin of normal renal parenchyma. The tumor appeared to be excised without any gross violation of the tumor. The renal collecting system was entered during removal of the tumor.  A running 3-0 V-lock suture was then brought through the capsule of the kidney and run along the base of the renal defect to provide hemostasis and close any entry into the renal collecting system if present. Weck clips were used to secure this suture outside the renal capsule at the proximal and distal ends. An additional hemostatic agent (Surgiflo) was then placed into the renal defect. A running 2-0 V lock suture was then used to close the capsule of the kidney using a sliding clip technique which resulted in excellent hemostasis.    The bulldog clamps were then removed from the renal hilar vessel(s). Total warm renal ischemia time was 21 minutes. The renal tumor resection site was examined. Hemostasis appeared adequate.   The kidney was placed back into its normal anatomic position and covered with perinephric fat as needed.  A # 69 Blake drain was then brought through the lateral lower port site and positioned in the perinephric space.  It was secured to the skin with a nylon suture. The surgical cart was undocked.  The renal tumor specimen was removed intact within an endopouch retrieval bag via the upper midline port site. This incision site was closed at the fascial layer with 0-vicryl suture. All other laparoscopic/robotic ports were removed under direct vision  and the pneumoperitoneum let down with inspection of the operative field performed and hemostasis again confirmed. All incision sites were then injected with local anesthetic and reapproximated at the skin level with 4-0 monocryl subcuticular closures.  Dermabond was applied to the skin.  The patient tolerated the procedure well and without complications.  The patient was able to be extubated and transferred to the recovery unit in satisfactory condition.  Pryor Curia MD

## 2017-09-14 NOTE — Transfer of Care (Signed)
Immediate Anesthesia Transfer of Care Note  Patient: Marvin Rivas  Procedure(s) Performed: ROBOTIC ASSITED PARTIAL NEPHRECTOMY (Right )  Patient Location: PACU  Anesthesia Type:General  Level of Consciousness: drowsy and patient cooperative  Airway & Oxygen Therapy: Patient Spontanous Breathing and Patient connected to face mask oxygen  Post-op Assessment: Report given to RN and Post -op Vital signs reviewed and stable  Post vital signs: Reviewed and stable  Last Vitals:  Vitals:   09/14/17 0508  BP: (!) 151/69  Pulse: 75  Resp: 18  Temp: 36.9 C  SpO2: 100%    Last Pain:  Vitals:   09/14/17 0508  TempSrc: Oral      Patients Stated Pain Goal: 4 (40/08/67 6195)  Complications: No apparent anesthesia complications

## 2017-09-14 NOTE — Anesthesia Postprocedure Evaluation (Signed)
Anesthesia Post Note  Patient: Marvin Rivas  Procedure(s) Performed: ROBOTIC ASSITED PARTIAL NEPHRECTOMY (Right )     Patient location during evaluation: PACU Anesthesia Type: General Level of consciousness: awake and alert Pain management: pain level controlled Vital Signs Assessment: post-procedure vital signs reviewed and stable Respiratory status: spontaneous breathing, nonlabored ventilation and respiratory function stable Cardiovascular status: blood pressure returned to baseline and stable Postop Assessment: no apparent nausea or vomiting Anesthetic complications: no Comments: Patient with complaint of itchy, red left eye in PACU. No blurred vision or pain. Likely dry eye related to prolonged procedure. Eye drops provided.    Last Vitals:  Vitals:   09/14/17 0508  BP: (!) 151/69  Pulse: 75  Resp: 18  Temp: 36.9 C  SpO2: 100%    Last Pain:  Vitals:   09/14/17 0508  TempSrc: Oral                 Audry Pili

## 2017-09-14 NOTE — Anesthesia Procedure Notes (Signed)
Procedure Name: Intubation Date/Time: 09/14/2017 7:22 AM Performed by: Montel Clock, CRNA Pre-anesthesia Checklist: Patient identified, Emergency Drugs available, Suction available, Patient being monitored and Timeout performed Patient Re-evaluated:Patient Re-evaluated prior to induction Oxygen Delivery Method: Circle system utilized Preoxygenation: Pre-oxygenation with 100% oxygen Induction Type: IV induction Ventilation: Mask ventilation without difficulty, Two handed mask ventilation required and Oral airway inserted - appropriate to patient size Laryngoscope Size: Mac and 3 Grade View: Grade I Tube type: Oral Tube size: 7.5 mm Number of attempts: 1 Airway Equipment and Method: Stylet Placement Confirmation: ETT inserted through vocal cords under direct vision,  positive ETCO2 and breath sounds checked- equal and bilateral Secured at: 23 cm Tube secured with: Tape Dental Injury: Teeth and Oropharynx as per pre-operative assessment

## 2017-09-14 NOTE — Discharge Instructions (Signed)

## 2017-09-14 NOTE — Care Management Note (Signed)
Case Management Note  Patient Details  Name: Marvin Rivas MRN: [id number] Date of Birth: [birth year]  Subjective/Objective:  66 y/o m admitted w/renal mass. From home. S/p Partial nephrectomy.                  Action/Plan:d/c plan home.   Expected Discharge Date:  (unknown)               Expected Discharge Plan:  Home/Self Care  In-House Referral:     Discharge planning Services  CM Consult  Post Acute Care Choice:    Choice offered to:     DME Arranged:    DME Agency:     HH Arranged:    HH Agency:     Status of Service:  In process, will continue to follow  If discussed at Long Length of Stay Meetings, dates discussed:    Additional Comments:  Dessa Phi, RN 09/14/2017, 3:42 PM

## 2017-09-15 ENCOUNTER — Encounter (HOSPITAL_COMMUNITY): Payer: Self-pay | Admitting: Urology

## 2017-09-15 LAB — BASIC METABOLIC PANEL
Anion gap: 6 (ref 5–15)
BUN: 14 mg/dL (ref 6–20)
CO2: 32 mmol/L (ref 22–32)
Calcium: 8.5 mg/dL — ABNORMAL LOW (ref 8.9–10.3)
Chloride: 101 mmol/L (ref 101–111)
Creatinine, Ser: 1.17 mg/dL (ref 0.61–1.24)
Glucose, Bld: 132 mg/dL — ABNORMAL HIGH (ref 65–99)
POTASSIUM: 3.8 mmol/L (ref 3.5–5.1)
SODIUM: 139 mmol/L (ref 135–145)

## 2017-09-15 LAB — CREATININE, FLUID (PLEURAL, PERITONEAL, JP DRAINAGE): Creat, Fluid: 1.1 mg/dL

## 2017-09-15 LAB — HEMOGLOBIN AND HEMATOCRIT, BLOOD
HEMATOCRIT: 37.5 % — AB (ref 39.0–52.0)
HEMOGLOBIN: 12.4 g/dL — AB (ref 13.0–17.0)

## 2017-09-15 MED ORDER — ACETAMINOPHEN 325 MG PO TABS
650.0000 mg | ORAL_TABLET | Freq: Four times a day (QID) | ORAL | Status: DC
  Administered 2017-09-15 – 2017-09-16 (×3): 650 mg via ORAL
  Filled 2017-09-15 (×4): qty 2

## 2017-09-15 MED ORDER — FAMOTIDINE 20 MG PO TABS
20.0000 mg | ORAL_TABLET | Freq: Two times a day (BID) | ORAL | Status: DC
  Filled 2017-09-15: qty 1

## 2017-09-15 MED ORDER — OXYCODONE HCL 5 MG PO CAPS: 5.0000 mg | ORAL_CAPSULE | ORAL | 0 refills | Status: DC | PRN

## 2017-09-15 MED ORDER — MORPHINE SULFATE (PF) 4 MG/ML IV SOLN: 2.0000 mg | INTRAVENOUS | Status: DC | PRN

## 2017-09-15 MED ORDER — DEXTROSE-NACL 5-0.45 % IV SOLN
INTRAVENOUS | Status: AC
  Administered 2017-09-15: 09:00:00 via INTRAVENOUS

## 2017-09-15 MED ORDER — BISMUTH SUBSALICYLATE 262 MG PO CHEW
524.0000 mg | CHEWABLE_TABLET | ORAL | Status: DC | PRN
  Administered 2017-09-15: 524 mg via ORAL
  Filled 2017-09-15: qty 2

## 2017-09-15 NOTE — Progress Notes (Signed)
Urology Progress Note   1 Day Post-Op s/p Right pNx for large lower polar tumo, notable for collecting system entry.  Subjective: NAEON. Pain controlled. Tolerating clears.  Hgb stable post op, 12.4 today   Cr 1.17  Objective: Vital signs in last 24 hours: Temp:  [98 F (36.7 C)-98.7 F (37.1 C)] 98.7 F (37.1 C) (11/16 0502) Pulse Rate:  [74-102] 74 (11/16 0502) Resp:  [15-20] 16 (11/16 0502) BP: (124-150)/(50-88) 124/50 (11/16 0502) SpO2:  [96 %-100 %] 96 % (11/16 0502)  Intake/Output from previous day: 11/15 0701 - 11/16 0700 In: 5850.4 [P.O.:120; I.V.:5430.4; IV Piggyback:300] Out: 2122 [Urine:3875; Drains:140; Blood:300] Intake/Output this shift: No intake/output data recorded.  Physical Exam:  General: Alert and oriented CV: RRR Lungs: Clear Abdomen: Soft, appropriately tender. Incisions c/d/io GU: Foley in place draining clear yellow urine  Ext: NT, No erythema  Lab Results: Recent Labs    09/14/17 1239 09/15/17 0454  HGB 13.7 12.4*  HCT 41.2 37.5*   BMET Recent Labs    09/15/17 0454  NA 139  K 3.8  CL 101  CO2 32  GLUCOSE 132*  BUN 14  CREATININE 1.17  CALCIUM 8.5*     Studies/Results: No results found.  Assessment/Plan:  66 y.o. male s/p r pNx.  Overall doing well post-op.   - Advance to regular diet  - DC foley today ( high risk of urinary retention given prior hx)  - IV fluids to 78 - Continue pain mgmt  - Ok to ambulate - JP in place, will check JPCr this afternoon  Dispo: Pending    LOS: 1 day   Alla Feeling, MD 09/15/2017, 7:06 AM

## 2017-09-15 NOTE — Progress Notes (Signed)
Pt. had CPAP placed on prior to my arrival and was tolerating well with no c/o, RT to monitor.

## 2017-09-15 NOTE — Discharge Summary (Signed)
[facility] Discharge Summary  Admit date: 09/14/2017  Discharge date: 09/16/17  Discharge to: Home  Discharge Service: Urology  Discharge Attending Physician:  Alinda Money  Discharge  Diagnoses: Renal mass  Secondary Diagnosis: Active Problems:   Renal mass   OR Procedures: Procedure(s): ROBOTIC ASSITED PARTIAL NEPHRECTOMY 09/14/2017   Ancillary Procedures: None   Discharge Day Services: The patient was seen and examined by the Urology team both in the morning and immediately prior to discharge.  Vital signs and laboratory values were stable and within normal limits.  The physical exam was benign and unchanged and all surgical wounds were examined.  Discharge instructions were explained and all questions answered.  Subjective  No acute events overnight. Pain Controlled. No fever or chills.  Objective Patient Vitals for the past 8 hrs:  BP Temp Temp src Pulse Resp SpO2  09/15/17 0800 (!) 146/62 98.2 F (36.8 C) Oral 73 16 97 %  09/15/17 0502 (!) 124/50 98.7 F (37.1 C) Oral 74 16 96 %   Total I/O In: 240 [P.O.:240] Out: 400 [Urine:400]  General Appearance:        No acute distress Lungs:                       Normal work of breathing on room air Heart:                                Regular rate and rhythm Abdomen:                         Soft, non-tender, non-distended, incisions c/d/i Extremities:                      Warm and well perfused   Hospital Course:  The patient underwent Right partial Nx  on 09/14/2017.  The patient tolerated the procedure well, was extubated in the OR, and afterwards was taken to the PACU for routine post-surgical care. When stable the patient was transferred to the floor.   The patient did well postoperatively.  The patient's diet was slowly advanced and at the time of discharge was tolerating a regular diet.  The patient was discharged home 2 Day Post-Op, at which point was tolerating a regular solid diet, was able to void  spontaneously, have adequate pain control with P.O. pain medication, and could ambulate without difficulty. His JP Cr was negativeThe patient will follow up with [place] for post op check.   Condition at Discharge: Improved  Discharge Medications:  Allergies as of 09/15/2017   No Known Allergies     Medication List    STOP taking these medications   aspirin 81 MG tablet     TAKE these medications   acetaminophen 500 MG tablet Commonly known as:  TYLENOL Take 1,000 mg by mouth every 6 (six) hours as needed for moderate pain or headache.   finasteride 5 MG tablet Commonly known as:  PROSCAR Take 5 mg by mouth daily.   gabapentin 600 MG tablet Commonly known as:  NEURONTIN Take 1 tablet (600 mg total) by mouth 4 (four) times daily.   losartan-hydrochlorothiazide 50-12.5 MG tablet Commonly known as:  HYZAAR Take 0.5 tablets daily by mouth.   methocarbamol 500 MG tablet Commonly known as:  ROBAXIN Take 1 tablet (500 mg total) by mouth every 6 (six) hours as needed for muscle spasms.  OVER THE COUNTER MEDICATION Place 2 sprays into both nostrils as needed (for nasal congestion). [address] Nasal Spray [employer] Brand   oxycodone 5 MG capsule Commonly known as:  OXY-IR Take 1-2 capsules (5-10 mg total) every 4 (four) hours as needed by mouth.   oxyCODONE-acetaminophen 5-325 MG tablet Commonly known as:  PERCOCET/ROXICET Take 1-2 tablets every 6 (six) hours as needed by mouth for moderate pain or severe pain. What changed:    when to take this  reasons to take this   ranitidine 75 MG tablet Commonly known as:  ZANTAC Take 75 mg by mouth 2 (two) times daily.   tamsulosin 0.4 MG Caps capsule Commonly known as:  FLOMAX Take 0.4 mg by mouth daily.

## 2017-09-15 NOTE — Progress Notes (Signed)
Patient voided three times since foley catheter removal. First voided 50cc, then 125cc, and the third void was 100cc. Checked a post void residual after the third void, per MD orders. Post void residual showed 50cc of urine in the bladder. Patient denies feeling like his bladder is full. Will continue to monitor urine output.   Marvin Rivas [facility] 09/15/2017 2:55 PM

## 2017-09-15 NOTE — Progress Notes (Signed)
Patient ID: Marvin Rivas, male   DOB: 10/21/1951, 66 y.o.   MRN: [id number]  Pt doing well.  Tolerating diet.  Ambulating well.  Abd incision with slight drainage.  Dressing placed for reinforcement.  Wound looks good.  No separation.  Drain fluid sent for Cr. Sl IVF D/C drain if Cr level c/w serum. D/C in AM if doing well.

## 2017-11-30 NOTE — Progress Notes (Signed)
[name] DOB: Mar 25, 1951   REASON FOR VISIT: follow up for neuropathy HISTORY FROM:patient    HISTORY OF PRESENT ILLNESS:UPDATE 2/4/2019CM Marvin Rivas, 67 year old male returns for follow-up with history of neuropathy.  His symptoms are fairly controlled on gabapentin 600 mg 4 times daily.  He has been given information on Metanx but does not wish to try that medication.  She eats on the bed still bother his feet at night.  He continues to ambulate with single-point cane but no falls.  He had spinal surgery 07/11/17  by Marvin Rivas.  He had robotic assisted partial nephrectomy due to renal mass on the right on 09/14/2017.  Hemoglobin A1c 5.1 and he has been off of his diabetic medications.  He returns for reevaluation   UPDATE 8/2/2018CM Marvin Rivas, 67 year old male returns for follow-up. He was last seen in this office March 2016 by Marvin Rivas for diabetic peripheral neuropathy. The patient has been on gabapentin 600 mg 3 times daily since that time. His neuropathy has worsened over the last year. The sheets  bother his feet in bed at night and wake him up. He ambulates with a single-point cane no recent falls. He has a long history of chronic low back pain and is due to have surgery by Marvin Rivas in September. He reports that his diabetes is in good control he checks his blood sugars. CBG this morning was 100. He also has a history of obstructive sleep apnea and uses CPAP. He returns for reevaluation  01/26/15 Marvin Rivas is a 67 year old right-handed white male with a history of diabetes associated with a diabetic peripheral neuropathy. The patient is on gabapentin taking 600 mg 3 times daily with some good improvement in the pain level. The patient does have some discomfort in the feet with prolonged weightbearing. He indicates that he cannot walk with bare feet, his skin is sensitive on the feet. He denies any significant balance issues, he reports no  falls since last seen. His diabetes is under good control with a hemoglobin A1c of 6.1. He indicates that he does not exercise much. He has a history of lumbosacral spine surgery, some chronic low back pain that is not severe. He sleeps well at night. He returns for an evaluation. He denies any new medical issues that have come up since last seen.   REVIEW OF SYSTEMS: Full 14 system review of systems performed and notable only for those listed, all others are neg:  Constitutional: neg  Cardiovascular: neg Ear/Nose/Throat: neg  Skin: neg Eyes: neg Respiratory: neg Gastroitestinal: neg  Hematology/Lymphatic: neg  Endocrine: neg Musculoskeletal: neg Allergy/Immunology: neg Neurological: Numbness in the feet and lower extremities,  Psychiatric: neg Sleep : Obstructive sleep apnea with CPAP   ALLERGIES: No Known Allergies  HOME MEDICATIONS: Outpatient Medications Prior to Visit  Medication Sig Dispense Refill  . acetaminophen (TYLENOL) 500 MG tablet Take 1,000 mg by mouth every 6 (six) hours as needed for moderate pain or headache.    . finasteride (PROSCAR) 5 MG tablet Take 5 mg by mouth daily.    Marland Kitchen gabapentin (NEURONTIN) 600 MG tablet Take 1 tablet (600 mg total) by mouth 4 (four) times daily. 120 tablet 6  . losartan-hydrochlorothiazide (HYZAAR) 50-12.5 MG tablet Take 0.5 tablets daily by mouth.    Marland Kitchen OVER THE COUNTER MEDICATION Place 2 sprays into both nostrils as needed (for nasal congestion). [address] Nasal Spray [employer] Brand    . ranitidine (ZANTAC) 75  MG tablet Take 75 mg by mouth 2 (two) times daily.    . Tamsulosin HCl (FLOMAX) 0.4 MG CAPS Take 0.4 mg by mouth daily.     . methocarbamol (ROBAXIN) 500 MG tablet Take 1 tablet (500 mg total) by mouth every 6 (six) hours as needed for muscle spasms. (Patient not taking: Reported on 12/04/2017) 60 tablet 1  . oxycodone (OXY-IR) 5 MG capsule Take 1-2 capsules (5-10 mg total) every 4 (four) hours as needed by mouth. (Patient not  taking: Reported on 12/04/2017) 10 capsule 0  . oxyCODONE-acetaminophen (PERCOCET/ROXICET) 5-325 MG tablet Take 1-2 tablets every 6 (six) hours as needed by mouth for moderate pain or severe pain. (Patient not taking: Reported on 12/04/2017) 30 tablet 0   No facility-administered medications prior to visit.     PAST MEDICAL HISTORY: Past Medical History:  Diagnosis Date  . Abnormality of gait 01/26/2015  . Arthritis   . Carpal tunnel syndrome on both sides   . Diabetes mellitus without complication ([place])    controlled with diet  . Dyslipidemia   . GERD (gastroesophageal reflux disease)   . Glaucoma    pt states he had a 2nd opthalmologist opinion and was told he did not have glaucoma  . Hypertension   . Morbid obesity ([place])   . Neuropathy   . Neuropathy    legs  . OSA on CPAP   . Polyneuropathy in diabetes(357.2) 01/23/2014  . Renal neoplasm   . TIA (transient ischemic attack)    ?2013    PAST SURGICAL HISTORY: Past Surgical History:  Procedure Laterality Date  . ANTERIOR LAT LUMBAR FUSION N/A 07/11/2017   Procedure: L2-3 L3-4 Anteriolateral lumbar interbody fusion with posterior fixation and exploration/revision of previous fusion L4 to S1;  Surgeon: Marvin Levine, MD;  Location: [facility];  Service: Neurosurgery;  Laterality: N/A;  L2-3 L3-4 Anteriolateral lumbar interbody fusion with posterior fixation and exploration/revision of previous fusion L4 to S1  . BACK SURGERY    . COLONOSCOPY    . LUMBAR PERCUTANEOUS PEDICLE SCREW 2 LEVEL N/A 07/11/2017   Procedure: LUMBAR PERCUTANEOUS PEDICLE SCREW 2 LEVEL;  Surgeon: Marvin Levine, MD;  Location: [facility];  Service: Neurosurgery;  Laterality: N/A;  . NASAL SEPTUM SURGERY    . ROBOTIC ASSITED PARTIAL NEPHRECTOMY Right 09/14/2017   Procedure: ROBOTIC ASSITED PARTIAL NEPHRECTOMY;  Surgeon: Marvin Bring, MD;  Location: [facility];  Service: Urology;  Laterality: Right;  NEEDS 210 MIN FOR PROCEDURE  . SPINAL FUSION    . toenail removed     both  big toenails removed as a child    FAMILY HISTORY: Family History  Problem Relation Age of Onset  . Prostate cancer Father   . Hypertension Mother     SOCIAL HISTORY: Social History   Socioeconomic History  . Marital status: Married    Spouse name: janyce  . Number of children: 2  . Years of education: 52  . Highest education level: Not on file  Social Needs  . Financial resource strain: Not on file  . Food insecurity - worry: Not on file  . Food insecurity - inability: Not on file  . Transportation needs - medical: Not on file  . Transportation needs - non-medical: Not on file  Occupational History  . Occupation: MANUFACTURING Lobbyist: [employer]  Tobacco Use  . Smoking status: Former Smoker    Last attempt to quit: 01/24/1999    Years since quitting: 18.8  . Smokeless tobacco:  Never Used  Substance and Sexual Activity  . Alcohol use: No  . Drug use: No  . Sexual activity: Not on file  Other Topics Concern  . Not on file  Social History Narrative   Patient is right handed.   Patient drinks about 6-7 cups of caffeine per day.     PHYSICAL EXAM  Vitals:   12/04/17 0752  BP: 136/74  Pulse: (!) 51  Weight: 264 lb (119.7 kg)  Height: 5\' 9"  (1.753 m)   Body mass index is 38.99 kg/m.  Generalized: Well developed, Morbidly obese male in no acute distress  Head: normocephalic and atraumatic,. Oropharynx benign  Neck: Supple,   Musculoskeletal: No deformity   Neurological examination   Mentation: Alert oriented to time, place, history taking. Attention span and concentration appropriate. Recent and remote memory intact.  Follows all commands speech and language fluent.   Cranial nerve II-XII: Pupils were equal round reactive to light extraocular movements were full, visual field were full on confrontational test. Facial sensation and strength were normal. hearing was intact to finger rubbing bilaterally. Uvula tongue midline. head turning and  shoulder shrug were normal and symmetric.Tongue protrusion into cheek strength was normal. Motor: normal bulk and tone, full strength in the BUE, BLE, Sensory: Decreased pinprick to knees bilaterally, decreased vibratory to ankles, decreased soft touch to the right leg relative to the left   Coordination: finger-nose-finger, heel-to-shin bilaterally, no dysmetria, no tremor. Reflexes: Depressed and symmetric plantar responses were flexor bilaterally. Gait and Station: Rising up from seated position without assistance, normal stance,  moderate stride, good arm swing, smooth turning, able to perform tiptoe, and heel walking without difficulty. Tandem gait is unsteady.   DIAGNOSTIC DATA (LABS, IMAGING, TESTING) - I reviewed patient records, labs, notes, testing and imaging myself where available.  Lab Results  Component Value Date   WBC 5.1 09/07/2017   HGB 12.4 (L) 09/15/2017   HCT 37.5 (L) 09/15/2017   MCV 90.6 09/07/2017   PLT 212 09/07/2017      Component Value Date/Time   NA 139 09/15/2017 0454   K 3.8 09/15/2017 0454   CL 101 09/15/2017 0454   CO2 32 09/15/2017 0454   GLUCOSE 132 (H) 09/15/2017 0454   BUN 14 09/15/2017 0454   CREATININE 1.17 09/15/2017 0454   CALCIUM 8.5 (L) 09/15/2017 0454   PROT 6.8 12/21/2012 0940   ALBUMIN 3.6 12/21/2012 0940   AST 16 12/21/2012 0940   ALT 18 12/21/2012 0940   ALKPHOS 52 12/21/2012 0940   BILITOT 0.2 (L) 12/21/2012 0940   GFRNONAA >60 09/15/2017 0454   GFRAA >60 09/15/2017 0454       ASSESSMENT AND PLAN  67 y.o. year old male  has a past medical history of Abnormality of gait , Morbid obesity  peripheral neuropathy,  and  obstructive sleep apnea with CPAP  PLAN: Continue  gabapentin  4 times daily 600 mg tablets Continue to use cane for safe ambulation Exercise for overall health and well-being Complex B vitamins OTC  Follow-up in 8 months I spent 20 minutes in total face to face time with the patient more than 50% of which  was spent counseling and coordination of care, reviewing test results reviewing medications and discussing and reviewing the diagnosis of  peripheral neuropathy and further treatment options.  Also reviewed his 2 recent hospital admission in September and November Nancy Carolyn Martin, [facility], [facility], [facility] [address] [place], [place] [address] [phone]  [phone] 

## 2017-12-04 ENCOUNTER — Encounter: Payer: Self-pay | Admitting: Nurse Practitioner

## 2017-12-04 ENCOUNTER — Ambulatory Visit: Payer: Medicare Other | Admitting: Nurse Practitioner

## 2017-12-04 DIAGNOSIS — G629 Polyneuropathy, unspecified: Secondary | ICD-10-CM | POA: Insufficient documentation

## 2017-12-04 DIAGNOSIS — G6289 Other specified polyneuropathies: Secondary | ICD-10-CM

## 2017-12-04 MED ORDER — GABAPENTIN 600 MG PO TABS: 600.0000 mg | ORAL_TABLET | Freq: Four times a day (QID) | ORAL | 2 refills | Status: DC

## 2017-12-04 NOTE — Patient Instructions (Signed)
Continue  gabapentin  4 times daily 600 mg tablets Continue to use cane for safe ambulation Exercise for overall health and well-being Complex B vitamins OTC  Follow-up in 8 months

## 2017-12-04 NOTE — Progress Notes (Signed)
I have read the note, and I agree with the clinical assessment and plan.  Deanndra Kirley K Jamaurion Slemmer   

## 2018-04-06 ENCOUNTER — Other Ambulatory Visit: Payer: Self-pay | Admitting: Urology

## 2018-04-06 DIAGNOSIS — R972 Elevated prostate specific antigen [PSA]: Secondary | ICD-10-CM

## 2018-05-11 ENCOUNTER — Ambulatory Visit
Admission: RE | Admit: 2018-05-11 | Discharge: 2018-05-11 | Disposition: A | Discharge status: Home / Self Care | Payer: Medicare Other | Source: Ambulatory Visit | Attending: Urology | Admitting: Urology

## 2018-05-11 DIAGNOSIS — R972 Elevated prostate specific antigen [PSA]: Secondary | ICD-10-CM

## 2018-05-11 MED ORDER — GADOBENATE DIMEGLUMINE 529 MG/ML IV SOLN
20.0000 mL | Freq: Once | INTRAVENOUS | Status: AC | PRN
  Administered 2018-05-11: 20 mL via INTRAVENOUS

## 2018-08-01 NOTE — Progress Notes (Signed)
[name] DOB: 1950-12-31   REASON FOR VISIT: follow up for neuropathy [place] ILLNESS:UPDATE 10/4/2019CM [name], 67 year old male returns for follow-up with a history of peripheral neuropathy.  He is currently on gabapentin 400 mg 4 times a day with fairly good control of his symptoms.  His feet remain very sensitive.  He did not want to try Melanex.  He ambulates with a single-point cane denies any falls or balance issues.  He has hand controls on his steering well for driving.  Hemoglobin A1c has stayed stable and he is no longer on diabetic medications.  He continues to use CPAP every day.  Dr. Maxwell Caul is his sleep doctor.  He returns for reevaluation   UPDATE 2/4/2019CM [name], 67 year old male returns for follow-up with history of neuropathy.  His symptoms are fairly controlled on gabapentin 600 mg 4 times daily.  He has been given information on Metanx but does not wish to try that medication.  The sheets on the bed still bother his feet at night.  He continues to ambulate with single-point cane but no falls.  He had spinal surgery 07/11/17  by Dr. Vertell Limber.  He had robotic assisted partial nephrectomy due to renal mass on the right on 09/14/2017.  Hemoglobin A1c 5.1 and he has been off of his diabetic medications.  He returns for reevaluation   UPDATE 8/2/2018CM [name], 67 year old male returns for follow-up. He was last seen in this office March 2016 by Dr. Jannifer Franklin for diabetic peripheral neuropathy. The patient has been on gabapentin 600 mg 3 times daily since that time. His neuropathy has worsened over the last year. The sheets  bother his feet in bed at night and wake him up. He ambulates with a single-point cane no recent falls. He has a long history of chronic low back pain and is due to have surgery by Dr. Vertell Limber in September. He reports that his diabetes is in good control he checks his blood  sugars. CBG this morning was 100. He also has a history of obstructive sleep apnea and uses CPAP. He returns for reevaluation  01/26/15 [name] is a 67 year old right-handed white male with a history of diabetes associated with a diabetic peripheral neuropathy. The patient is on gabapentin taking 600 mg 3 times daily with some good improvement in the pain level. The patient does have some discomfort in the feet with prolonged weightbearing. He indicates that he cannot walk with bare feet, his skin is sensitive on the feet. He denies any significant balance issues, he reports no falls since last seen. His diabetes is under good control with a hemoglobin A1c of 6.1. He indicates that he does not exercise much. He has a history of lumbosacral spine surgery, some chronic low back pain that is not severe. He sleeps well at night. He returns for an evaluation. He denies any new medical issues that have come up since last seen.   REVIEW OF SYSTEMS: Full 14 system review of systems performed and notable only for those listed, all others are neg:  Constitutional: neg  Cardiovascular: neg Ear/Nose/Throat: neg  Skin: neg Eyes: neg Respiratory: neg Gastroitestinal: neg  Hematology/Lymphatic: neg  Endocrine: neg Musculoskeletal: Joint pain Allergy/Immunology: neg Neurological: Numbness in the feet and lower extremities,  Psychiatric: neg Sleep : Obstructive sleep apnea with CPAP   ALLERGIES: No Known Allergies  HOME MEDICATIONS: Outpatient Medications Prior to Visit  Medication Sig  Dispense Refill  . acetaminophen (TYLENOL) 500 MG tablet Take 1,000 mg by mouth every 6 (six) hours as needed for moderate pain or headache.    . finasteride (PROSCAR) 5 MG tablet Take 5 mg by mouth daily.    Marland Kitchen gabapentin (NEURONTIN) 600 MG tablet Take 1 tablet (600 mg total) by mouth 4 (four) times daily. 360 tablet 2  . losartan-hydrochlorothiazide (HYZAAR) 50-12.5 MG tablet Take 0.5 tablets daily by mouth.    Marland Kitchen  OVER THE COUNTER MEDICATION Place 2 sprays into both nostrils as needed (for nasal congestion). [address] Nasal Spray [employer] Brand    . ranitidine (ZANTAC) 75 MG tablet Take 75 mg by mouth 2 (two) times daily.    . Tamsulosin HCl (FLOMAX) 0.4 MG CAPS Take 0.4 mg by mouth daily.      No facility-administered medications prior to visit.     PAST MEDICAL HISTORY: Past Medical History:  Diagnosis Date  . Abnormality of gait 01/26/2015  . Arthritis   . Carpal tunnel syndrome on both sides   . Diabetes mellitus without complication ([place])    controlled with diet  . Dyslipidemia   . GERD (gastroesophageal reflux disease)   . Glaucoma    pt states he had a 2nd opthalmologist opinion and was told he did not have glaucoma  . Hypertension   . Morbid obesity ([place])   . Neuropathy   . Neuropathy    legs  . OSA on CPAP   . Polyneuropathy in diabetes(357.2) 01/23/2014  . Renal neoplasm   . TIA (transient ischemic attack)    ?2013    PAST SURGICAL HISTORY: Past Surgical History:  Procedure Laterality Date  . ANTERIOR LAT LUMBAR FUSION N/A 07/11/2017   Procedure: L2-3 L3-4 Anteriolateral lumbar interbody fusion with posterior fixation and exploration/revision of previous fusion L4 to S1;  Surgeon: Erline Levine, MD;  Location: [facility];  Service: Neurosurgery;  Laterality: N/A;  L2-3 L3-4 Anteriolateral lumbar interbody fusion with posterior fixation and exploration/revision of previous fusion L4 to S1  . BACK SURGERY    . COLONOSCOPY    . LUMBAR PERCUTANEOUS PEDICLE SCREW 2 LEVEL N/A 07/11/2017   Procedure: LUMBAR PERCUTANEOUS PEDICLE SCREW 2 LEVEL;  Surgeon: Erline Levine, MD;  Location: [facility];  Service: Neurosurgery;  Laterality: N/A;  . NASAL SEPTUM SURGERY    . ROBOTIC ASSITED PARTIAL NEPHRECTOMY Right 09/14/2017   Procedure: ROBOTIC ASSITED PARTIAL NEPHRECTOMY;  Surgeon: Raynelle Bring, MD;  Location: [facility];  Service: Urology;  Laterality: Right;  NEEDS 210 MIN FOR PROCEDURE  . SPINAL FUSION     . toenail removed     both big toenails removed as a child    FAMILY HISTORY: Family History  Problem Relation Age of Onset  . Prostate cancer Father   . Hypertension Mother     SOCIAL HISTORY: Social History   Socioeconomic History  . Marital status: Married    Spouse name: janyce  . Number of children: 2  . Years of education: 39  . Highest education level: Not on file  Occupational History  . Occupation: MANUFACTURING Lobbyist: [employer]  Social Needs  . Financial resource strain: Not on file  . Food insecurity:    Worry: Not on file    Inability: Not on file  . Transportation needs:    Medical: Not on file    Non-medical: Not on file  Tobacco Use  . Smoking status: Former Smoker    Last attempt to  quit: 01/24/1999    Years since quitting: 19.5  . Smokeless tobacco: Never Used  Substance and Sexual Activity  . Alcohol use: No  . Drug use: No  . Sexual activity: Not on file  Lifestyle  . Physical activity:    Days per week: Not on file    Minutes per session: Not on file  . Stress: Not on file  Relationships  . Social connections:    Talks on phone: Not on file    Gets together: Not on file    Attends religious service: Not on file    Active member of club or organization: Not on file    Attends meetings of clubs or organizations: Not on file    Relationship status: Not on file  . Intimate partner violence:    Fear of current or ex partner: Not on file    Emotionally abused: Not on file    Physically abused: Not on file    Forced sexual activity: Not on file  Other Topics Concern  . Not on file  Social History Narrative   Patient is right handed.   Patient drinks about 6-7 cups of caffeine per day.     PHYSICAL EXAM  Vitals:   08/03/18 0814  BP: 117/71  Pulse: (!) 54  Weight: 260 lb (117.9 kg)  Height: 5\' 9"  (1.753 m)   Body mass index is 38.4 kg/m.  Generalized: Well developed, Morbidly obese male in no acute  distress  Head: normocephalic and atraumatic,. Oropharynx benign  Neck: Supple,   Musculoskeletal: No deformity   Neurological examination   Mentation: Alert oriented to time, place, history taking. Attention span and concentration appropriate. Recent and remote memory intact.  Follows all commands speech and language fluent.   Cranial nerve II-XII: Pupils were equal round reactive to light extraocular movements were full, visual field were full on confrontational test. Facial sensation and strength were normal. hearing was intact to finger rubbing bilaterally. Uvula tongue midline. head turning and shoulder shrug were normal and symmetric.Tongue protrusion into cheek strength was normal. Motor: normal bulk and tone, full strength in the BUE, BLE, Sensory: Decreased pinprick to knees bilaterally, decreased vibratory to knees decreased soft touch to the right leg relative to the left   Coordination: finger-nose-finger, heel-to-shin bilaterally, no dysmetria, no tremor. Reflexes: Depressed and symmetric plantar responses were flexor bilaterally. Gait and Station: Rising up from seated position without assistance, normal stance,  moderate stride, good arm swing, smooth turning, able to perform tiptoe, and heel walking without difficulty. Tandem gait is unsteady.  Ambulates with single-point cane  DIAGNOSTIC DATA (LABS, IMAGING, TESTING) - I reviewed patient records, labs, notes, testing and imaging myself where available.  Lab Results  Component Value Date   WBC 5.1 09/07/2017   HGB 12.4 (L) 09/15/2017   HCT 37.5 (L) 09/15/2017   MCV 90.6 09/07/2017   PLT 212 09/07/2017      Component Value Date/Time   NA 139 09/15/2017 0454   K 3.8 09/15/2017 0454   CL 101 09/15/2017 0454   CO2 32 09/15/2017 0454   GLUCOSE 132 (H) 09/15/2017 0454   BUN 14 09/15/2017 0454   CREATININE 1.17 09/15/2017 0454   CALCIUM 8.5 (L) 09/15/2017 0454   PROT 6.8 12/21/2012 0940   ALBUMIN 3.6 12/21/2012 0940    AST 16 12/21/2012 0940   ALT 18 12/21/2012 0940   ALKPHOS 52 12/21/2012 0940   BILITOT 0.2 (L) 12/21/2012 0940   GFRNONAA >60 09/15/2017 3903  GFRAA >60 09/15/2017 0454       ASSESSMENT AND PLAN  67 y.o. year old male  has a past medical history of Abnormality of gait , Morbid obesity  peripheral neuropathy,  and  obstructive sleep apnea with CPAP.  His neuropathy symptoms are stable on gabapentin..The patient is a current patient of Dr. Jannifer Franklin  who is out of the office today . This note is sent to the work in doctor.     PLAN: Continue  gabapentin  4 times daily 600 mg tablets will refill  Continue to use cane for safe ambulation Exercise for overall health and well-being Follow-up in 1 year I spent 20 minutes in total face to face time with the patient more than 50% of which was spent counseling and coordination of care, reviewing test results reviewing medications and discussing and reviewing the diagnosis of  peripheral neuropathy and further treatment options.   Dennie Bible, [facility], [facility], APRN  [facility] [address] [place], [place] [address] [phone]

## 2018-08-03 ENCOUNTER — Encounter: Payer: Self-pay | Admitting: Nurse Practitioner

## 2018-08-03 ENCOUNTER — Ambulatory Visit: Payer: Medicare Other | Admitting: Nurse Practitioner

## 2018-08-03 VITALS — BP 117/71 | HR 54 | Ht 69.0 in | Wt 260.0 lb

## 2018-08-03 DIAGNOSIS — G6289 Other specified polyneuropathies: Secondary | ICD-10-CM

## 2018-08-03 DIAGNOSIS — R269 Unspecified abnormalities of gait and mobility: Secondary | ICD-10-CM

## 2018-08-03 MED ORDER — GABAPENTIN 600 MG PO TABS: 600.0000 mg | ORAL_TABLET | Freq: Four times a day (QID) | ORAL | 3 refills | Status: DC

## 2018-08-03 NOTE — Patient Instructions (Signed)
Continue  gabapentin  4 times daily 600 mg tablets Continue to use cane for safe ambulation Exercise for overall health and well-being Follow-up in 1 year

## 2018-08-07 ENCOUNTER — Other Ambulatory Visit: Payer: Self-pay | Admitting: Orthopaedic Surgery

## 2018-08-07 DIAGNOSIS — M25511 Pain in right shoulder: Secondary | ICD-10-CM

## 2018-08-08 NOTE — Progress Notes (Signed)
I have reviewed and agreed above plan. 

## 2018-08-12 IMAGING — RF DG C-ARM 61-120 MIN
1 series · 3 of 3 positions shown · non-contrast
Comparison: MRI 04/17/2017.

CLINICAL DATA: Lumbar spine surgery.

EXAM:
DG C-ARM 61-120 MIN; LUMBAR SPINE - 2-3 VIEW

[Series 1: run · 3 of 3 slices shown]
[im 1/3]
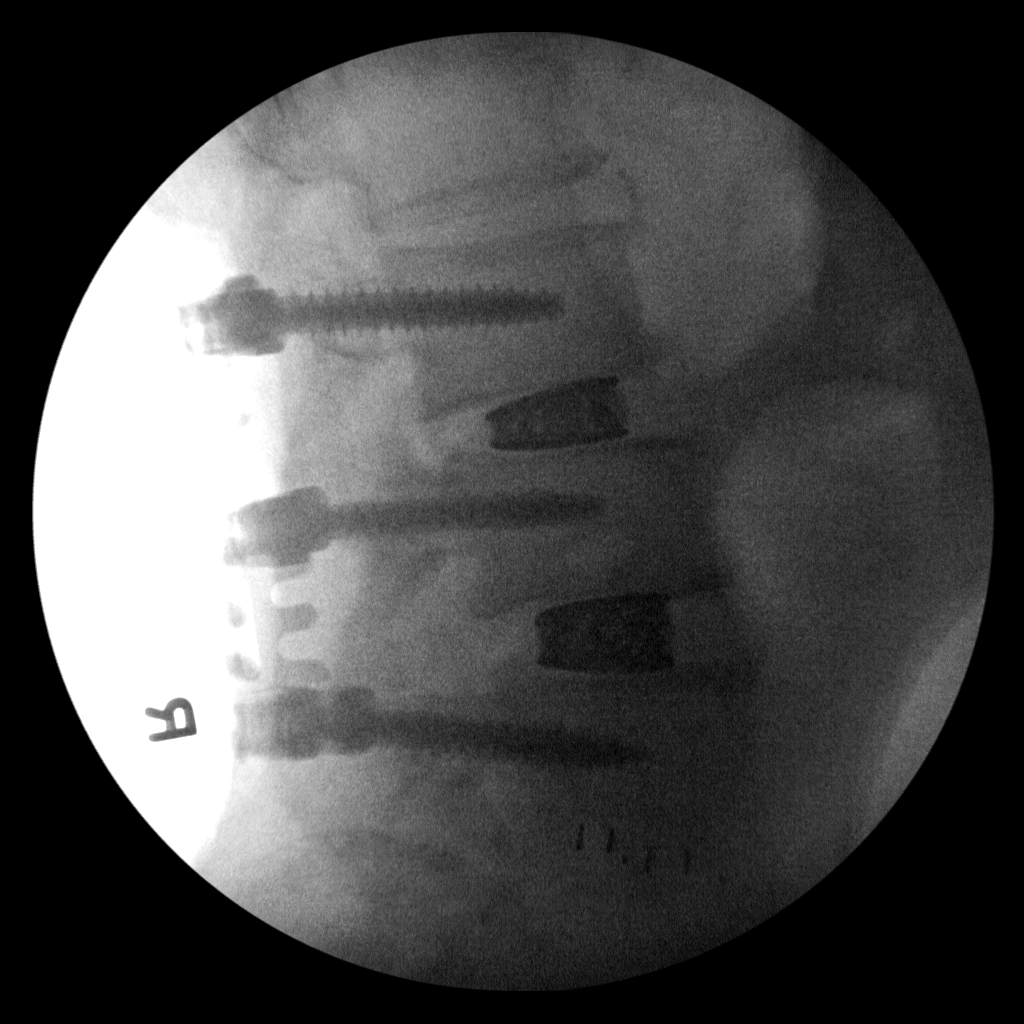
[im 2/3]
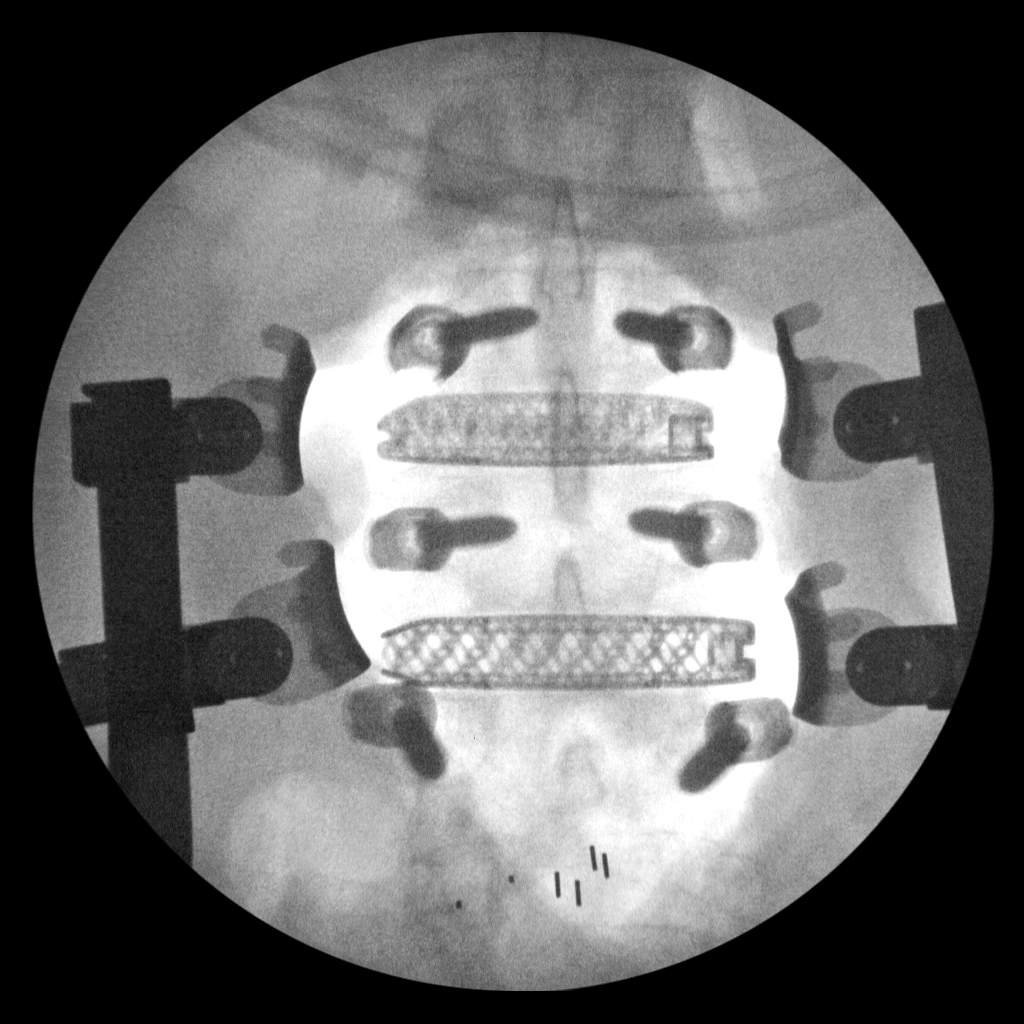
[im 3/3]
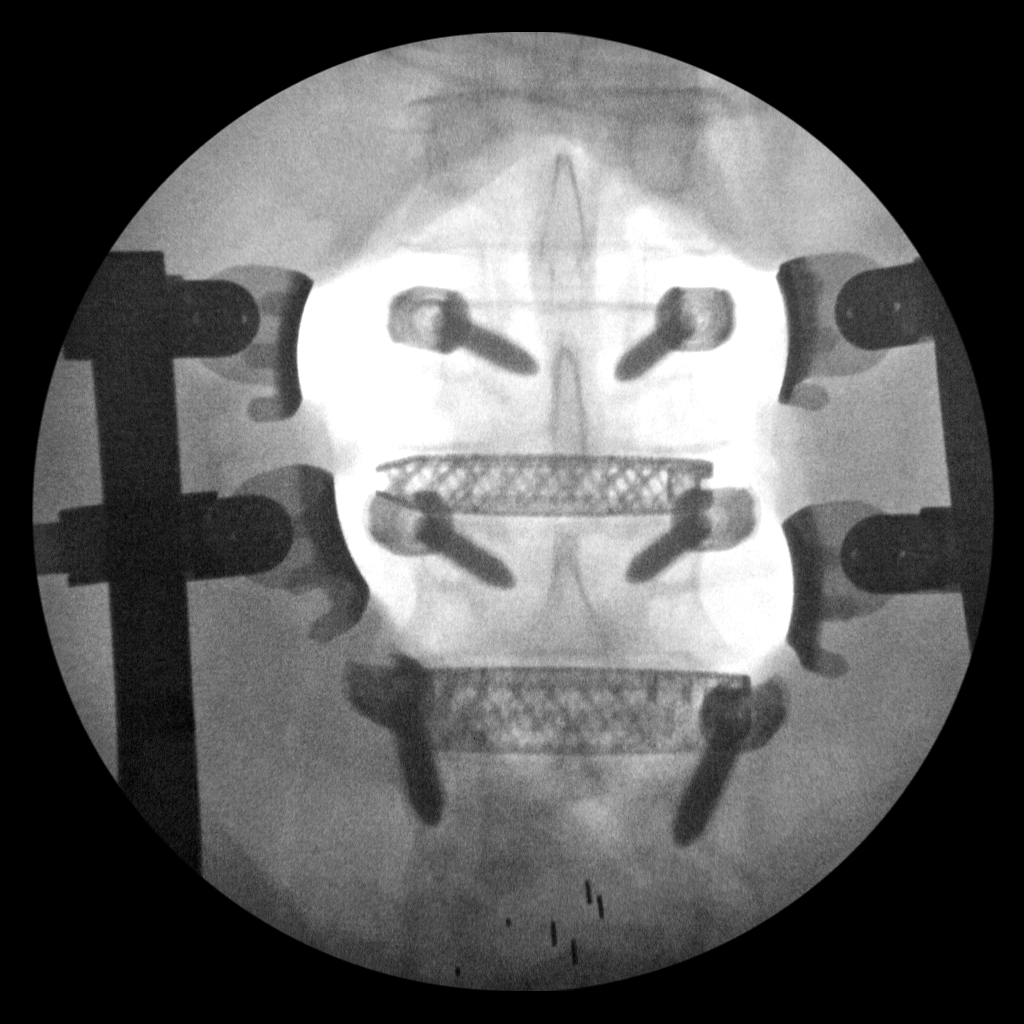

[3 of 3 positions shown; findings below may reference images not displayed]

FINDINGS: Postsurgical changes lumbar spine. Lumbar vertebra difficult to
number given fields of view. Pedicle screws and inter disc fusion
devices are noted.
IMPRESSION: Postsurgical changes lumbar spine.

## 2018-08-13 ENCOUNTER — Ambulatory Visit
Admission: RE | Admit: 2018-08-13 | Discharge: 2018-08-13 | Disposition: A | Discharge status: Home / Self Care | Payer: Medicare Other | Source: Ambulatory Visit | Attending: Orthopaedic Surgery | Admitting: Orthopaedic Surgery

## 2018-08-13 DIAGNOSIS — M25511 Pain in right shoulder: Secondary | ICD-10-CM

## 2018-11-23 ENCOUNTER — Other Ambulatory Visit: Payer: Self-pay | Admitting: Orthopaedic Surgery

## 2018-11-23 DIAGNOSIS — M25562 Pain in left knee: Secondary | ICD-10-CM

## 2018-12-03 ENCOUNTER — Ambulatory Visit
Admission: RE | Admit: 2018-12-03 | Discharge: 2018-12-03 | Disposition: A | Discharge status: Home / Self Care | Payer: Medicare Other | Source: Ambulatory Visit | Attending: Orthopaedic Surgery | Admitting: Orthopaedic Surgery

## 2018-12-03 DIAGNOSIS — M25562 Pain in left knee: Secondary | ICD-10-CM

## 2019-06-18 ENCOUNTER — Other Ambulatory Visit (HOSPITAL_COMMUNITY)
Admission: RE | Admit: 2019-06-18 | Discharge: 2019-06-18 | Disposition: A | Discharge status: Home / Self Care | Payer: Medicare Other | Source: Ambulatory Visit | Attending: Urology | Admitting: Urology

## 2019-06-18 ENCOUNTER — Other Ambulatory Visit: Payer: Self-pay | Admitting: Urology

## 2019-06-18 DIAGNOSIS — N201 Calculus of ureter: Secondary | ICD-10-CM | POA: Diagnosis not present

## 2019-06-18 DIAGNOSIS — Z20828 Contact with and (suspected) exposure to other viral communicable diseases: Secondary | ICD-10-CM | POA: Diagnosis not present

## 2019-06-18 DIAGNOSIS — N210 Calculus in bladder: Secondary | ICD-10-CM | POA: Diagnosis not present

## 2019-06-18 DIAGNOSIS — Z01812 Encounter for preprocedural laboratory examination: Secondary | ICD-10-CM | POA: Diagnosis not present

## 2019-06-18 LAB — SARS CORONAVIRUS 2 (TAT 6-24 HRS): SARS Coronavirus 2: NEGATIVE

## 2019-06-18 NOTE — Patient Instructions (Addendum)
YOU HAD  A COVID 19 TEST ON 06-18-2019.  ONCE YOUR COVID TEST IS COMPLETED, PLEASE BEGIN THE QUARANTINE INSTRUCTIONS AS OUTLINED IN YOUR HANDOUT.                Marvin Rivas    Your procedure is scheduled on: 06-20-2019  Report to [facility] Main  Entrance              Report to admitting at 59  AM               [address] [place]. NO VISITORS ARE ALLOWED IN SHORT STAY OR RECOVERY ROOM.   Call this number if you have problems the morning of surgery [phone]    Remember: Do not eat food or drink liquids :After Midnight.               BRUSH YOUR TEETH MORNING OF SURGERY AND RINSE YOUR MOUTH OUT, NO CHEWING GUM CANDY OR MINTS.               Please bring CPAP mask and tubing with you to the hospital!   Take these medicines the morning of surgery with A SIP OF WATER: Tamulosin (Flomax), Gabapentin (Neurontin)                                You may not have any metal on your body including hair pins and              piercings  Do not wear jewelry, make-up, lotions, powders or perfumes, deodorant                          Men may shave face and neck.   Do not bring valuables to the hospital. [place].  Contacts, dentures or bridgework may not be worn into surgery.  Leave suitcase in the car. After surgery it may be brought to your room.     Patients discharged the day of surgery will not be allowed to drive home. IF YOU ARE HAVING SURGERY AND GOING HOME THE SAME DAY, YOU MUST HAVE AN ADULT TO DRIVE YOU HOME AND BE WITH YOU FOR 24 HOURS. YOU MAY GO HOME BY TAXI OR UBER OR ORTHERWISE, BUT AN ADULT MUST ACCOMPANY YOU HOME AND STAY WITH YOU FOR 24 HOURS.  Name and phone number of your driver:spouse- Marvin Rivas               Please read over the following fact sheets you were given: _____________________________________________________________________             [facility] - Preparing for Surgery Before surgery, you can play an important role.  Because skin is not sterile, your skin needs to be as free of germs as possible.  You can reduce the number of germs on your skin by washing with CHG (chlorahexidine gluconate) soap before surgery.  CHG is an antiseptic cleaner which kills germs and bonds with the skin to continue killing germs even after washing. Please DO NOT use if you have an allergy to CHG or antibacterial soaps.  If your skin becomes reddened/irritated stop using the CHG and inform your nurse when you arrive at Short Stay. Do  not shave (including legs and underarms) for at least 48 hours prior to the first CHG shower.  You may shave your face/neck. Please follow these instructions carefully:  1.  Shower with CHG Soap the night before surgery and the  morning of Surgery.  2.  If you choose to wash your hair, wash your hair first as usual with your  normal  shampoo.  3.  After you shampoo, rinse your hair and body thoroughly to remove the  shampoo.                           4.  Use CHG as you would any other liquid soap.  You can apply chg directly  to the skin and wash                       Gently with a scrungie or clean washcloth.  5.  Apply the CHG Soap to your body ONLY FROM THE NECK DOWN.   Do not use on face/ open                           Wound or open sores. Avoid contact with eyes, ears mouth and genitals (private parts).                       Wash face,  Genitals (private parts) with your normal soap.             6.  Wash thoroughly, paying special attention to the area where your surgery  will be performed.  7.  Thoroughly rinse your body with warm water from the neck down.  8.  DO NOT shower/wash with your normal soap after using and rinsing off  the CHG Soap.                9.  Pat yourself dry with a clean towel.            10.  Wear clean pajamas.            11.  Place clean sheets on your bed the night of your first shower and do not   sleep with pets. Day of Surgery : Do not apply any lotions/deodorants the morning of surgery.  Please wear clean clothes to the hospital/surgery center.  FAILURE TO FOLLOW THESE INSTRUCTIONS MAY RESULT IN THE CANCELLATION OF YOUR SURGERY PATIENT SIGNATURE_________________________________  NURSE SIGNATURE__________________________________  ________________________________________________________________________

## 2019-06-19 ENCOUNTER — Encounter (HOSPITAL_COMMUNITY)
Admission: RE | Admit: 2019-06-19 | Discharge: 2019-06-19 | Disposition: A | Discharge status: Home / Self Care | Payer: Medicare Other | Source: Ambulatory Visit | Attending: Urology | Admitting: Urology

## 2019-06-19 ENCOUNTER — Encounter (HOSPITAL_COMMUNITY): Payer: Self-pay

## 2019-06-19 ENCOUNTER — Other Ambulatory Visit: Payer: Self-pay

## 2019-06-19 DIAGNOSIS — Z905 Acquired absence of kidney: Secondary | ICD-10-CM | POA: Diagnosis not present

## 2019-06-19 DIAGNOSIS — K219 Gastro-esophageal reflux disease without esophagitis: Secondary | ICD-10-CM | POA: Diagnosis not present

## 2019-06-19 DIAGNOSIS — R350 Frequency of micturition: Secondary | ICD-10-CM | POA: Diagnosis not present

## 2019-06-19 DIAGNOSIS — Z8042 Family history of malignant neoplasm of prostate: Secondary | ICD-10-CM | POA: Diagnosis not present

## 2019-06-19 DIAGNOSIS — C649 Malignant neoplasm of unspecified kidney, except renal pelvis: Secondary | ICD-10-CM | POA: Diagnosis not present

## 2019-06-19 DIAGNOSIS — N401 Enlarged prostate with lower urinary tract symptoms: Secondary | ICD-10-CM | POA: Diagnosis not present

## 2019-06-19 DIAGNOSIS — Z791 Long term (current) use of non-steroidal anti-inflammatories (NSAID): Secondary | ICD-10-CM | POA: Diagnosis not present

## 2019-06-19 DIAGNOSIS — N201 Calculus of ureter: Secondary | ICD-10-CM | POA: Diagnosis not present

## 2019-06-19 DIAGNOSIS — N210 Calculus in bladder: Secondary | ICD-10-CM | POA: Diagnosis present

## 2019-06-19 DIAGNOSIS — E119 Type 2 diabetes mellitus without complications: Secondary | ICD-10-CM | POA: Diagnosis not present

## 2019-06-19 DIAGNOSIS — I10 Essential (primary) hypertension: Secondary | ICD-10-CM | POA: Diagnosis not present

## 2019-06-19 DIAGNOSIS — M1990 Unspecified osteoarthritis, unspecified site: Secondary | ICD-10-CM | POA: Diagnosis not present

## 2019-06-19 DIAGNOSIS — E7800 Pure hypercholesterolemia, unspecified: Secondary | ICD-10-CM | POA: Diagnosis not present

## 2019-06-19 DIAGNOSIS — Z79899 Other long term (current) drug therapy: Secondary | ICD-10-CM | POA: Diagnosis not present

## 2019-06-19 DIAGNOSIS — Z87891 Personal history of nicotine dependence: Secondary | ICD-10-CM | POA: Diagnosis not present

## 2019-06-19 DIAGNOSIS — Z7982 Long term (current) use of aspirin: Secondary | ICD-10-CM | POA: Diagnosis not present

## 2019-06-19 DIAGNOSIS — Z8673 Personal history of transient ischemic attack (TIA), and cerebral infarction without residual deficits: Secondary | ICD-10-CM | POA: Diagnosis not present

## 2019-06-19 LAB — CBC
HCT: 44 % (ref 39.0–52.0)
Hemoglobin: 14.6 g/dL (ref 13.0–17.0)
MCH: 30.3 pg (ref 26.0–34.0)
MCHC: 33.2 g/dL (ref 30.0–36.0)
MCV: 91.3 fL (ref 80.0–100.0)
Platelets: 209 10*3/uL (ref 150–400)
RBC: 4.82 MIL/uL (ref 4.22–5.81)
RDW: 13 % (ref 11.5–15.5)
WBC: 5.3 10*3/uL (ref 4.0–10.5)
nRBC: 0 % (ref 0.0–0.2)

## 2019-06-19 LAB — BASIC METABOLIC PANEL
Anion gap: 8 (ref 5–15)
BUN: 14 mg/dL (ref 8–23)
CO2: 28 mmol/L (ref 22–32)
Calcium: 9.5 mg/dL (ref 8.9–10.3)
Chloride: 105 mmol/L (ref 98–111)
Creatinine, Ser: 1.08 mg/dL (ref 0.61–1.24)
GFR calc Af Amer: >60 mL/min (ref >60)
GFR calc non Af Amer: >60 mL/min (ref >60)
Glucose, Bld: 103 mg/dL — ABNORMAL HIGH (ref 70–99)
Potassium: 4.2 mmol/L (ref 3.5–5.1)
Sodium: 141 mmol/L (ref 135–145)

## 2019-06-19 LAB — GLUCOSE, CAPILLARY: Glucose-Capillary: 105 mg/dL — ABNORMAL HIGH (ref 70–99)

## 2019-06-19 LAB — HEMOGLOBIN A1C
Hgb A1c MFr Bld: 5.3 % (ref 4.8–5.6)
Mean Plasma Glucose: 105.41 mg/dL

## 2019-06-19 NOTE — H&P (Signed)
Office Visit Report     06/12/2019   --------------------------------------------------------------------------------   Marvin Rivas  MRN: [id number]  DOB: May 16, 1951, 68 year old Male  SSN: [id number]   PRIMARY CARE:  Modena Jansky. Marisue Humble, MD  REFERRING:  Raynelle Bring, Eduardo Osier  PROVIDER:  Raynelle Bring, M.D.  TREATING:  Jiles Crocker  LOCATION:  [facility] [phone]     --------------------------------------------------------------------------------   CC/HPI: 05/31/2019: Marvin Rivas has a pending prostate biopsy in September with Dr. Alinda Money for rising PSA. He also has a history of renal cell carcinoma and when undergoing protocol surveillance imaging studies earlier in the month patient was noted to have a nonobstructing left mid ureteral calculi as well as 2 adjacent left sided bladder calculi. Pt has not exhibited any pain or discomfort, increased LUTs as a result of these incidental findings. He f/u today for KUB and renal u/s in regards to the noted ureteral and bladder calculi in anticipation of possible stone treatment prior to scheduled prostate biopsy.   Today he denies any left-sided pain/discomfort, interval stone material passage, gross hematuria. He continues to have intermittent urinary frequency that is stable for his baseline as well as intermittent burning with urination. Patient continues tamsulosin and is straining his urine as directed. No interval fever/chills, nausea/vomiting.   06/12/2019: He returns for repeat KUB and renal u/s for evaluation of interval passage of a previously noted left mid-ureteral calculi. His urine c/s at last visit was positive for streptococcus viridans. I covered him with antimicrobial coverage at that time due to the presence of infection.   Denies any interval stone material passage, unilateral pain/discomfort, or gross hematuria. Continues to void at his baseline with intermittent urinary frequency as well as some mild  intermittent burning with urination. He continues on tamsulosin and is straining his urine. No interval fever/chills or nausea/vomiting.     ALLERGIES: No Allergies    MEDICATIONS: Hydrocodone-Acetaminophen 5 mg-325 mg tablet 1-2 tablet PO Q 6 H  Aspir 81  ATORVASTATIN CALCIUM PO Weekly  Finasteride 5 MG Oral Tablet Oral  Gabapentin 600 mg tablet Oral  LOSARTAN-HYDROCHLOROTHIAZIDE PO Daily  Meloxicam 15 mg tablet  RANITIDINE HCL PO Daily  Tamsulosin HCl - 0.4 MG Oral Capsule 1 Oral Daily     GU PSH: Locm 300-399Mg/Ml Iodine,1Ml - 05/17/2019, 10/17/2018, 04/02/2018, 08/04/2017 Partial nephrectomy (laparoscopic), Right - 09/14/2017       PSH Notes: Exploration Of Spinal Fusion, Back Surgery   NON-GU PSH: Back Surgery (Unspecified) Explore Spinal Fusion - 2011     GU PMH: Bladder Stone - 05/31/2019 Ureteral calculus - 05/31/2019 Renal cell carcinoma, right - 10/11/2017 Weak Urinary Stream - 10/11/2017 Urinary Retention (Stable) - 07/24/2017, - 07/21/2017 Right renal neoplasm - 07/21/2017 BPH w/LUTS, Benign prostatic hyperplasia with urinary obstruction - 2014 BPH w/o LUTS, Benign prostatic hypertrophy without lower urinary tract symptoms - 2014 CIS of prostate/prostatic urethra, PIN III (prostatic intraepithelial neoplasia III) - 2014 Dysuria, Dysuria - 2014 ED due to arterial insufficiency, Erectile dysfunction due to arterial insufficiency - 2014 Elevated PSA, Elevated prostate specific antigen (PSA) - 2014, Elevated prostate specific antigen (PSA), - 2014 History of urolithiasis, Nephrolithiasis - 2014 Nocturia, Nocturia - 2014 Urinary Urgency, Urinary urgency - 2014      PMH Notes:   ** His medical history is significant for diabetes, hypertension, sleep apnea (he uses CPAP), and acid reflux and a history of a TIA multiple years ago. He also has multiple issues related to arthritis and has  had multiple lumbar back surgeries including lumbar back surgery with what sounds like a  fusion performed on 07/11/17.   1) BPH/LUTS: After lumbar back surgery in September 2018, he developed acute urinary retention and presented to the emergency department on 07/15/17 with overflow incontinence and urinary frequency. He had a urinary catheter placed that relieved his discomfort immediately.   Current treatment: Finasteride and tamsulosin   2) Renal cell carcinoma: He is s/p right RAL partial nephrectomy on 09/14/17 for a 6.0 cm right renal mass.   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 He has a strong family history of prostate cancer with his father having been diagnosed in his late 40s. His father was treated with primary radiation therapy and developed recurrence and ultimately died in his late 36s of metastatic prostate cancer. His PSA has since fluctuated. This was last checked in 2014 when it was noted to be just over 4 although he was on finasteride at that time. He has been quite reluctant to consider a repeat biopsy.   Jul 2019: MRI - unremarkable (possible inflammation)   4) Urolithiasis: He does have nonobstructing bilateral urolithiasis noted on his CT scan from 07/15/17. He has not been symptomatic related to the stones.     NON-GU PMH: Arthritis Diabetes Type 2 GERD Glaucoma Hypercholesterolemia Hypertension Sleep Apnea Stroke/TIA    FAMILY HISTORY: 1 Daughter - Daughter 1 son - Son Death In The Family Father - Runs In Family nephrolithiasis - Father Prostate Cancer - Father   SOCIAL HISTORY: Marital Status: Married Preferred Language: English Current Smoking Status: Patient does not smoke anymore.   Tobacco Use Assessment Completed: Used  Tobacco in last 30 days? Does not use smokeless tobacco. Has never drank.  Does not use drugs. Drinks 3 caffeinated drinks per day. Has not had a blood transfusion. 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     Notes: Former smoker, Occupation:, Alcohol Use, Marital History - Currently Married, Caffeine Use   REVIEW OF SYSTEMS:    GU Review Male:   Patient reports frequent urination, burning/ pain with urination, and get up at night to urinate. Patient denies hard to postpone urination, leakage of urine, stream starts and stops, trouble starting your streams, and have to strain to urinate .  Gastrointestinal (Upper):   Patient denies nausea and vomiting.  Gastrointestinal (Lower):   Patient denies diarrhea and constipation.  Constitutional:   Patient denies fever, night sweats, weight loss, and fatigue.  Skin:   Patient denies skin rash/ lesion and itching.  Eyes:   Patient denies blurred vision and double vision.  Ears/ Nose/ Throat:   Patient denies sore throat and sinus problems.  Hematologic/Lymphatic:   Patient denies swollen glands and easy bruising.  Cardiovascular:   Patient denies leg swelling and chest pains.  Respiratory:   Patient denies cough and shortness of breath.  Endocrine:   Patient denies excessive thirst.  Musculoskeletal:   Patient reports back pain. Patient denies joint pain.  Neurological:   Patient denies headaches and dizziness.  Psychologic:   Patient denies anxiety and depression.   Notes: Reviewed previous review of systems 05/31/2019. No changes.   VITAL SIGNS:  06/12/2019 08:42 AM  Weight 252 lb / 114.31 kg  BP 133/78 mmHg  Pulse 52 /min  Temperature 97.7 F / 36.5 C   MULTI-SYSTEM PHYSICAL EXAMINATION:    Constitutional: Well-nourished. No physical deformities. Normally developed. Good grooming.  Neck: Neck symmetrical, not swollen. Normal tracheal position.  Respiratory: No labored breathing, no use of accessory muscles.   Skin: No  paleness, no jaundice, no cyanosis. No lesion, no ulcer, no rash.  Neurologic / Psychiatric: Oriented to time, oriented to place, oriented to person. No depression, no anxiety, no agitation.  Gastrointestinal: Obese abdomen. No mass, no tenderness, no rigidity. No CVA, flank, lower abdominal tenderness.  Musculoskeletal: Normal gait and station of head and neck.     PAST DATA REVIEWED:  Source Of History:  Patient  Records Review:   Previous Hospital Records, Previous Patient Records  Urine Test Review:   Urinalysis, Urine Culture  X-Ray Review: KUB: Reviewed Films. Discussed With Patient.  Renal Ultrasound (Limited): Reviewed Films. Discussed With Patient.  C.T. Chest/ Abd/Pelvis: Reviewed Films.     05/13/19 05/03/19 10/11/18 07/23/18 03/29/18 02/14/13 08/10/12 12/23/11  PSA  Total PSA 8.53 ng/mL 7.15 ng/mL 4.83 ng/mL 4.14 ng/mL 5.44 ng/mL 4.39  4.70  5.23   Free PSA 2.42 ng/mL 1.88 ng/mL   1.74 ng/mL     % Free PSA 28 % PSA 26 % PSA   32 % PSA       09/05/06  Hormones  Testosterone, Total 2.68     06/12/19  Urinalysis  Urine Appearance Cloudy   Urine Color Amber   Urine Glucose Neg mg/dL  Urine Bilirubin Neg mg/dL  Urine Ketones Neg mg/dL  Urine Specific Gravity 1.025   Urine Blood 2+ ery/uL  Urine pH 6.0   Urine Protein 2+ mg/dL  Urine Urobilinogen 0.2 mg/dL  Urine Nitrites Neg   Urine Leukocyte Esterase 3+ leu/uL  Urine WBC/hpf >60/hpf   Urine RBC/hpf 3 - 10/hpf   Urine Epithelial Cells NS (Not Seen)   Urine Bacteria Few (10-25/hpf)   Urine Mucous Present   Urine Yeast NS (Not Seen)   Urine Trichomonas Not Present   Urine Cystals NS (Not Seen)   Urine Casts NS (Not Seen)   Urine Sperm Not Present    PROCEDURES:         Renal Ultrasound (Limited) - 15945  Left Kidney: Length: 12.4 cm Depth: 6.0 cm Cortical Width: 1.2 cm Width: 5.9 cm    Left Kidney/Ureter:  Multiple echogenic foci with the largest measuring 0.6 cm in the mid kidney ( calcifications vs  vessels). ----- Multiple cystic areas with no internal blood flow noted. The largest measures 3.2 cm x 2.9 cm x 3.1 cm in the lateral kidney.   Bladder:  PVR = 19.0 ml. ----- Estimated 1.2 cm x 0.8 cm calcification noted in the bladder.       Patient confirmed No Neulasta OnPro Device.             KUB - [id number]  A single view of the abdomen is obtained. KUB grossly unchanged from previous imaging study. No obvious left renal calculi visualized on today's exam, the anatomical structure tract of the left ureter does not show an obvious ureteral calculus. Large bladder calculi identified on the right side today. A much smaller opacity noted just lateral to the coccyx bone on the left is noted and likely represents the smaller bladder stones previously identified on most recent CT imaging. Based on this imaging study, can't  entirely exclude this opacity representing a distal UVJ stone. Prominent but grossly within normal limit bowel gas and stool pattern noted today. Stable appearing lumbar fixation hardware identified. Bilateral pelvic phleboliths also identified.              Patient confirmed No Neulasta OnPro Device.            Urinalysis w/Scope Dipstick Dipstick Cont'd Micro  Color: Amber Bilirubin: Neg mg/dL WBC/hpf: >60/hpf  Appearance: Cloudy Ketones: Neg mg/dL RBC/hpf: 3 - 10/hpf  Specific Gravity: 1.025 Blood: 2+ ery/uL Bacteria: Few (10-25/hpf)  pH: 6.0 Protein: 2+ mg/dL Cystals: NS (Not Seen)  Glucose: Neg mg/dL Urobilinogen: 0.2 mg/dL Casts: NS (Not Seen)    Nitrites: Neg Trichomonas: Not Present    Leukocyte Esterase: 3+ leu/uL Mucous: Present      Epithelial Cells: NS (Not Seen)      Yeast: NS (Not Seen)      Sperm: Not Present    Notes: Microscopic not concentrated due to clarity.    ASSESSMENT:      ICD-10 Details  1 GU:   Ureteral calculus - N20.1    PLAN:           Orders Labs Urine Culture          Schedule X-Rays: 1 Week - C.T. Stone Protocol  Without Contrast  Return Visit/Planned Activity: 1 Week - Office Visit          Document Letter(s):  Created for Patient: Clinical Summary         Notes:   No obstructive signs on ultrasound today. KUB remains inconclusive with inability to positively identify the previously noted ureteral calculus. I reviewed with Dr. Alinda Money. Plan to reculture urine and provide appropriate antibiotic coverage if indicated. He will return in 1 week for CT imaging to positively identify the ureteral calculi with plans to proceed with ureteroscopy if indeed imaging studies demonstrate the presence of a left ureteral stone before his scheduled biopsy of the prostate on September 1. All questions answered about plan of care to the best of my ability with understanding expressed by the patient.        Next Appointment:      Next Appointment: 06/19/2019 09:30 AM    Appointment Type: Office Visit Established Patient    Location: [facility] [phone]    Provider: Jiles Crocker    Reason for Visit: 1 wk fu      * Signed by Jiles Crocker on 06/12/19 at 9:13 AM (EDT)*       APPENDED NOTES:  I spoke with Marvin Rivas and reviewed his CT scan. This demonstrates a persistent left ureteral calculus albeit without significant hydronephrosis. He also has a bladder stone. Considering his stone has been unable to pass, I have recommended that we consider definitive treatment. As such, he is agreeable to proceed with cystoscopy, cystolitholapaxy, left ureteroscopy with laser lithotripsy and stone removal and possible left ureteral stent placement. Will then plan to keep his appointment on September 1st for his prostate biopsy. We will also plan to do a left renal ultrasound and check his PVR that day. He will need additional antibiotic coverage with ceftriaxone as he will begin fluoroquinolone therapy with ciprofloxacin for his current enterococcus urine culture.     * Signed by Raynelle Bring, M.D. on  06/17/19 at 4:44 PM (EDT)*

## 2019-06-20 ENCOUNTER — Encounter (HOSPITAL_COMMUNITY)
Admission: RE | Disposition: A | Discharge status: Home / Self Care | Payer: Self-pay | Source: Home / Self Care | Attending: Urology

## 2019-06-20 ENCOUNTER — Ambulatory Visit (HOSPITAL_COMMUNITY): Payer: Medicare Other | Admitting: Certified Registered Nurse Anesthetist

## 2019-06-20 ENCOUNTER — Encounter (HOSPITAL_COMMUNITY): Payer: Self-pay

## 2019-06-20 ENCOUNTER — Ambulatory Visit (HOSPITAL_COMMUNITY): Payer: Medicare Other

## 2019-06-20 ENCOUNTER — Ambulatory Visit (HOSPITAL_COMMUNITY)
Admission: RE | Admit: 2019-06-20 | Discharge: 2019-06-20 | Disposition: A | Discharge status: Home / Self Care | Payer: Medicare Other | Attending: Urology | Admitting: Urology

## 2019-06-20 DIAGNOSIS — N201 Calculus of ureter: Secondary | ICD-10-CM | POA: Insufficient documentation

## 2019-06-20 DIAGNOSIS — Z7982 Long term (current) use of aspirin: Secondary | ICD-10-CM | POA: Insufficient documentation

## 2019-06-20 DIAGNOSIS — N210 Calculus in bladder: Secondary | ICD-10-CM | POA: Diagnosis not present

## 2019-06-20 DIAGNOSIS — C649 Malignant neoplasm of unspecified kidney, except renal pelvis: Secondary | ICD-10-CM | POA: Diagnosis not present

## 2019-06-20 DIAGNOSIS — M1990 Unspecified osteoarthritis, unspecified site: Secondary | ICD-10-CM | POA: Insufficient documentation

## 2019-06-20 DIAGNOSIS — Z905 Acquired absence of kidney: Secondary | ICD-10-CM | POA: Insufficient documentation

## 2019-06-20 DIAGNOSIS — Z791 Long term (current) use of non-steroidal anti-inflammatories (NSAID): Secondary | ICD-10-CM | POA: Insufficient documentation

## 2019-06-20 DIAGNOSIS — R350 Frequency of micturition: Secondary | ICD-10-CM | POA: Insufficient documentation

## 2019-06-20 DIAGNOSIS — N401 Enlarged prostate with lower urinary tract symptoms: Secondary | ICD-10-CM | POA: Insufficient documentation

## 2019-06-20 DIAGNOSIS — K219 Gastro-esophageal reflux disease without esophagitis: Secondary | ICD-10-CM | POA: Insufficient documentation

## 2019-06-20 DIAGNOSIS — Z8673 Personal history of transient ischemic attack (TIA), and cerebral infarction without residual deficits: Secondary | ICD-10-CM | POA: Insufficient documentation

## 2019-06-20 DIAGNOSIS — E7800 Pure hypercholesterolemia, unspecified: Secondary | ICD-10-CM | POA: Insufficient documentation

## 2019-06-20 DIAGNOSIS — Z79899 Other long term (current) drug therapy: Secondary | ICD-10-CM | POA: Insufficient documentation

## 2019-06-20 DIAGNOSIS — Z8042 Family history of malignant neoplasm of prostate: Secondary | ICD-10-CM | POA: Insufficient documentation

## 2019-06-20 DIAGNOSIS — I10 Essential (primary) hypertension: Secondary | ICD-10-CM | POA: Insufficient documentation

## 2019-06-20 DIAGNOSIS — Z87891 Personal history of nicotine dependence: Secondary | ICD-10-CM | POA: Insufficient documentation

## 2019-06-20 DIAGNOSIS — E119 Type 2 diabetes mellitus without complications: Secondary | ICD-10-CM | POA: Insufficient documentation

## 2019-06-20 HISTORY — PX: CYSTOSCOPY/URETEROSCOPY/HOLMIUM LASER/STENT PLACEMENT: SHX6546

## 2019-06-20 HISTORY — PX: HOLMIUM LASER APPLICATION: SHX5852

## 2019-06-20 LAB — GLUCOSE, CAPILLARY: Glucose-Capillary: 88 mg/dL (ref 70–99)

## 2019-06-20 SURGERY — CYSTOSCOPY/URETEROSCOPY/HOLMIUM LASER/STENT PLACEMENT
Anesthesia: General | Laterality: Left

## 2019-06-20 MED ORDER — HYDROCODONE-ACETAMINOPHEN 5-325 MG PO TABS: 1.0000 | ORAL_TABLET | Freq: Four times a day (QID) | ORAL | 0 refills | Status: DC | PRN

## 2019-06-20 MED ORDER — ONDANSETRON HCL 4 MG/2ML IJ SOLN
INTRAMUSCULAR | Status: DC | PRN
  Administered 2019-06-20: 4 mg via INTRAVENOUS

## 2019-06-20 MED ORDER — LIDOCAINE HCL URETHRAL/MUCOSAL 2 % EX GEL
CUTANEOUS | Status: AC
  Filled 2019-06-20: qty 5

## 2019-06-20 MED ORDER — LACTATED RINGERS IV SOLN
INTRAVENOUS | Status: DC
  Administered 2019-06-20: 07:00:00 via INTRAVENOUS

## 2019-06-20 MED ORDER — DEXAMETHASONE SODIUM PHOSPHATE 10 MG/ML IJ SOLN
INTRAMUSCULAR | Status: AC
  Filled 2019-06-20: qty 1

## 2019-06-20 MED ORDER — LIDOCAINE 2% (20 MG/ML) 5 ML SYRINGE
INTRAMUSCULAR | Status: AC
  Filled 2019-06-20: qty 5

## 2019-06-20 MED ORDER — EPHEDRINE 5 MG/ML INJ
INTRAVENOUS | Status: AC
  Filled 2019-06-20: qty 10

## 2019-06-20 MED ORDER — PROPOFOL 10 MG/ML IV BOLUS
INTRAVENOUS | Status: DC | PRN
  Administered 2019-06-20: 200 mg via INTRAVENOUS

## 2019-06-20 MED ORDER — LIDOCAINE HCL URETHRAL/MUCOSAL 2 % EX GEL
CUTANEOUS | Status: DC | PRN
  Administered 2019-06-20: 1 via URETHRAL

## 2019-06-20 MED ORDER — IOHEXOL 300 MG/ML  SOLN
INTRAMUSCULAR | Status: DC | PRN
  Administered 2019-06-20: 09:00:00 50 mL

## 2019-06-20 MED ORDER — 0.9 % SODIUM CHLORIDE (POUR BTL) OPTIME
TOPICAL | Status: DC | PRN
  Administered 2019-06-20: 1000 mL

## 2019-06-20 MED ORDER — STERILE WATER FOR IRRIGATION IR SOLN
Status: DC | PRN
  Administered 2019-06-20: 3000 mL

## 2019-06-20 MED ORDER — ACETAMINOPHEN 160 MG/5ML PO SOLN: 325.0000 mg | ORAL | Status: DC | PRN

## 2019-06-20 MED ORDER — SODIUM CHLORIDE 0.9 % IR SOLN
Status: DC | PRN
  Administered 2019-06-20 (×2): 3000 mL

## 2019-06-20 MED ORDER — ONDANSETRON HCL 4 MG/2ML IJ SOLN: 4.0000 mg | Freq: Once | INTRAMUSCULAR | Status: DC | PRN

## 2019-06-20 MED ORDER — ACETAMINOPHEN 325 MG PO TABS: 325.0000 mg | ORAL_TABLET | ORAL | Status: DC | PRN

## 2019-06-20 MED ORDER — FENTANYL CITRATE (PF) 100 MCG/2ML IJ SOLN
INTRAMUSCULAR | Status: DC | PRN
  Administered 2019-06-20 (×2): 25 ug via INTRAVENOUS
  Administered 2019-06-20: 50 ug via INTRAVENOUS

## 2019-06-20 MED ORDER — OXYCODONE HCL 5 MG PO TABS: 5.0000 mg | ORAL_TABLET | Freq: Once | ORAL | Status: DC | PRN

## 2019-06-20 MED ORDER — LIDOCAINE 2% (20 MG/ML) 5 ML SYRINGE
INTRAMUSCULAR | Status: DC | PRN
  Administered 2019-06-20: 100 mg via INTRAVENOUS

## 2019-06-20 MED ORDER — ONDANSETRON HCL 4 MG/2ML IJ SOLN
INTRAMUSCULAR | Status: AC
  Filled 2019-06-20: qty 2

## 2019-06-20 MED ORDER — PHENYLEPHRINE HCL (PRESSORS) 10 MG/ML IV SOLN
INTRAVENOUS | Status: DC | PRN
  Administered 2019-06-20: 100 ug via INTRAVENOUS

## 2019-06-20 MED ORDER — CIPROFLOXACIN IN D5W 400 MG/200ML IV SOLN
400.0000 mg | Freq: Once | INTRAVENOUS | Status: AC
  Administered 2019-06-20: 09:00:00 400 mg via INTRAVENOUS
  Filled 2019-06-20: qty 200

## 2019-06-20 MED ORDER — OXYCODONE HCL 5 MG/5ML PO SOLN: 5.0000 mg | Freq: Once | ORAL | Status: DC | PRN

## 2019-06-20 MED ORDER — EPHEDRINE SULFATE 50 MG/ML IJ SOLN
INTRAMUSCULAR | Status: DC | PRN
  Administered 2019-06-20 (×4): 10 mg via INTRAVENOUS

## 2019-06-20 MED ORDER — KETOROLAC TROMETHAMINE 30 MG/ML IJ SOLN: 30.0000 mg | Freq: Once | INTRAMUSCULAR | Status: DC | PRN

## 2019-06-20 MED ORDER — FENTANYL CITRATE (PF) 100 MCG/2ML IJ SOLN: 25.0000 ug | INTRAMUSCULAR | Status: DC | PRN

## 2019-06-20 MED ORDER — MEPERIDINE HCL 50 MG/ML IJ SOLN: 6.2500 mg | INTRAMUSCULAR | Status: DC | PRN

## 2019-06-20 MED ORDER — SUCCINYLCHOLINE CHLORIDE 200 MG/10ML IV SOSY
PREFILLED_SYRINGE | INTRAVENOUS | Status: AC
  Filled 2019-06-20: qty 10

## 2019-06-20 MED ORDER — PROPOFOL 10 MG/ML IV BOLUS
INTRAVENOUS | Status: AC
  Filled 2019-06-20: qty 20

## 2019-06-20 MED ORDER — DEXAMETHASONE SODIUM PHOSPHATE 10 MG/ML IJ SOLN
INTRAMUSCULAR | Status: DC | PRN
  Administered 2019-06-20: 5 mg via INTRAVENOUS

## 2019-06-20 MED ORDER — FENTANYL CITRATE (PF) 100 MCG/2ML IJ SOLN
INTRAMUSCULAR | Status: AC
  Filled 2019-06-20: qty 2

## 2019-06-20 SURGICAL SUPPLY — 25 items
BAG URO CATCHER STRL LF (MISCELLANEOUS) ×3 IMPLANT
BASKET ZERO TIP NITINOL 2.4FR (BASKET) IMPLANT
BSKT STON RTRVL ZERO TP 2.4FR (BASKET)
CATH INTERMIT  6FR 70CM (CATHETERS) IMPLANT
CLOTH BEACON ORANGE TIMEOUT ST (SAFETY) ×3 IMPLANT
COVER SURGICAL LIGHT HANDLE (MISCELLANEOUS) ×1 IMPLANT
COVER WAND RF STERILE (DRAPES) IMPLANT
EXTRACTOR STONE 1.7FRX115CM (UROLOGICAL SUPPLIES) ×2 IMPLANT
FIBER LASER FLEXIVA 365 (UROLOGICAL SUPPLIES) IMPLANT
FIBER LASER FLEXIVA 550 (UROLOGICAL SUPPLIES) ×2 IMPLANT
FIBER LASER TRAC TIP (UROLOGICAL SUPPLIES) IMPLANT
GLOVE BIOGEL M STRL SZ7.5 (GLOVE) ×3 IMPLANT
GOWN STRL REUS W/TWL LRG LVL3 (GOWN DISPOSABLE) ×6 IMPLANT
GUIDEWIRE ANG ZIPWIRE 038X150 (WIRE) IMPLANT
GUIDEWIRE STR DUAL SENSOR (WIRE) ×5 IMPLANT
IV NS 1000ML (IV SOLUTION) ×3
IV NS 1000ML BAXH (IV SOLUTION) ×1 IMPLANT
KIT TURNOVER KIT A (KITS) IMPLANT
MANIFOLD NEPTUNE II (INSTRUMENTS) ×3 IMPLANT
PACK CYSTO (CUSTOM PROCEDURE TRAY) ×3 IMPLANT
SHEATH URETERAL 12FRX35CM (MISCELLANEOUS) IMPLANT
STENT URET 6FRX26 CONTOUR (STENTS) ×2 IMPLANT
TUBING CONNECTING 10 (TUBING) ×2 IMPLANT
TUBING CONNECTING 10' (TUBING) ×1
TUBING UROLOGY SET (TUBING) ×3 IMPLANT

## 2019-06-20 NOTE — Discharge Instructions (Addendum)
Indwelling Urinary Catheter Care, Adult An indwelling urinary catheter is a thin tube that is put into your bladder. The tube helps to drain pee (urine) out of your body. The tube goes in through your urethra. Your urethra is where pee comes out of your body. Your pee will come out through the catheter, then it will go into a bag (drainage bag). Take good care of your catheter so it will work well. How to wear your catheter and bag Supplies needed Sticky tape (adhesive tape) or a leg strap. Alcohol wipe or soap and water (if you use tape). A clean towel (if you use tape). Large overnight bag. Smaller bag (leg bag). Wearing your catheter Attach your catheter to your leg with tape or a leg strap. Make sure the catheter is not pulled tight. If a leg strap gets wet, take it off and put on a dry strap. If you use tape to hold the bag on your leg: Use an alcohol wipe or soap and water to wash your skin where the tape made it sticky before. Use a clean towel to pat-dry that skin. Use new tape to make the bag stay on your leg. Wearing your bags You should have been given a large overnight bag. You may wear the overnight bag in the day or night. Always have the overnight bag lower than your bladder.  Do not let the bag touch the floor. Before you go to sleep, put a clean plastic bag in a wastebasket. Then hang the overnight bag inside the wastebasket. You should also have a smaller leg bag that fits under your clothes. Always wear the leg bag below your knee. Do not wear your leg bag at night. How to care for your skin and catheter Supplies needed A clean washcloth. Water and mild soap. A clean towel. Caring for your skin and catheter     Clean the skin around your catheter every day: Wash your hands with soap and water. Wet a clean washcloth in warm water and mild soap. Clean the skin around your urethra. If you are male: Gently spread the folds of skin around your vagina  (labia). With the washcloth in your other hand, wipe the inner side of your labia on each side. Wipe from front to back. If you are male: Pull back any skin that covers the end of your penis (foreskin). With the washcloth in your other hand, wipe your penis in small circles. Start wiping at the tip of your penis, then move away from the catheter. Move the foreskin back in place, if needed. With your free hand, hold the catheter close to where it goes into your body. Keep holding the catheter during cleaning so it does not get pulled out. With the washcloth in your other hand, clean the catheter. Only wipe downward on the catheter. Do not wipe upward toward your body. Doing this may push germs into your urethra and cause infection. Use a clean towel to pat-dry the catheter and the skin around it. Make sure to wipe off all soap. Wash your hands with soap and water. Shower every day. Do not take baths. Do not use cream, ointment, or lotion on the area where the catheter goes into your body, unless your doctor tells you to. Do not use powders, sprays, or lotions on your genital area. Check your skin around the catheter every day for signs of infection. Check for: Redness, swelling, or pain. Fluid or blood. Warmth. Pus or a bad smell.  How to empty the bag Supplies needed Rubbing alcohol. Gauze pad or cotton ball. Tape or a leg strap. Emptying the bag Pour the pee out of your bag when it is ?- full, or at least 2-3 times a day. Do this for your overnight bag and your leg bag. Wash your hands with soap and water. Separate (detach) the bag from your leg. Hold the bag over the toilet or a clean pail. Keep the bag lower than your hips and bladder. This is so the pee (urine) does not go back into the tube. Open the pour spout. It is at the bottom of the bag. Empty the pee into the toilet or pail. Do not let the pour spout touch any surface. Put rubbing alcohol on a gauze pad or cotton  ball. Use the gauze pad or cotton ball to clean the pour spout. Close the pour spout. Attach the bag to your leg with tape or a leg strap. Wash your hands with soap and water. Follow instructions for cleaning the drainage bag: From the product maker. As told by your doctor. How to change the bag Supplies needed Alcohol wipes. A clean bag. Tape or a leg strap. Changing the bag Replace your bag when it starts to leak, smell bad, or look dirty. Wash your hands with soap and water. Separate the dirty bag from your leg. Pinch the catheter with your fingers so that pee does not spill out. Separate the catheter tube from the bag tube where these tubes connect (at the connection valve). Do not let the tubes touch any surface. Clean the end of the catheter tube with an alcohol wipe. Use a different alcohol wipe to clean the end of the bag tube. Connect the catheter tube to the tube of the clean bag. Attach the clean bag to your leg with tape or a leg strap. Do not make the bag tight on your leg. Wash your hands with soap and water. General rules  Never pull on your catheter. Never try to take it out. Doing that can hurt you. Always wash your hands before and after you touch your catheter or bag. Use a mild, fragrance-free soap. If you do not have soap and water, use hand sanitizer. Always make sure there are no twists or bends (kinks) in the catheter tube. Always make sure there are no leaks in the catheter or bag. Drink enough fluid to keep your pee pale yellow. Do not take baths, swim, or use a hot tub. If you are male, wipe from front to back after you poop (have a bowel movement). Contact a doctor if: Your pee is cloudy. Your pee smells worse than usual. Your catheter gets clogged. Your catheter leaks. Your bladder feels full. Get help right away if: You have redness, swelling, or pain where the catheter goes into your body. You have fluid, blood, pus, or a bad smell coming from  the area where the catheter goes into your body. Your skin feels warm where the catheter goes into your body. You have a fever. You have pain in your: Belly (abdomen). Legs. Lower back. Bladder. You see blood in the catheter. Your pee is pink or red. You feel sick to your stomach (nauseous). You throw up (vomit). You have chills. Your pee is not draining into the bag. Your catheter gets pulled out. Summary An indwelling urinary catheter is a thin tube that is placed into the bladder to help drain pee (urine) out of the body. The catheter  is placed into the part of the body that drains pee from the bladder (urethra). Taking good care of your catheter will keep it working properly and help prevent problems. Always wash your hands before and after touching your catheter or bag. Never pull on your catheter or try to take it out. This information is not intended to replace advice given to you by your health care provider. Make sure you discuss any questions you have with your health care provider. Document Released: 02/11/2013 Document Revised: 02/08/2019 Document Reviewed: 06/02/2017 Elsevier Patient Education  2020 [place] [address] may see some blood in the urine and may have some burning with urination for 48-72 hours. You also may notice that you have to urinate more frequently or urgently after your procedure which is normal.  2. You should call should you develop an inability urinate, fever > 101, persistent nausea and vomiting that prevents you from eating or drinking to stay hydrated.  3. If you have a stent, you will likely urinate more frequently and urgently until the stent is removed and you may experience some discomfort/pain in the lower abdomen and flank especially when urinating. You may take pain medication prescribed to you if needed for pain. You may also intermittently have blood in the urine until the stent is removed.       4.  You may remove your stent on Monday  morning.  Simply pull the string that is taped to your body and the stent will easily come out.  This may be best done in the shower as some urine may come out with the stent.  Usually you will feel relief once the stent is removed, but occasionally patients can develop pain due to residual swelling of the ureter that may temporarily obstruct the kidney.  This can be managed by taking pain medication and it will typically resolve with time.  Please do not hesitate to call if you have pain that is not controlled with your pain medication or does not improved within 24-48 hours.       5.   You can restart your aspirin in about 3 days if no longer having blood in the urine.       6.   Finish ciprofloxacin until it is done.

## 2019-06-20 NOTE — Interval H&P Note (Signed)
History and Physical Interval Note:  06/20/2019 8:15 AM  Marvin Rivas  has presented today for surgery, with the diagnosis of BLADDER STONE AND LEFT URETERAL STONE.  The various methods of treatment have been discussed with the patient and family. After consideration of risks, benefits and other options for treatment, the patient has consented to  Procedure(s): CYSTOSCOPY/ RETROGRADE/URETEROSCOPY/HOLMIUM LASER/STENT PLACEMENT/ CYSTOLITHALOPAXY (Left) HOLMIUM LASER APPLICATION (Left) as a surgical intervention.  The patient's history has been reviewed, patient examined, no change in status, stable for surgery.  I have reviewed the patient's chart and labs.  Questions were answered to the patient's satisfaction.     [employer]

## 2019-06-20 NOTE — Anesthesia Preprocedure Evaluation (Signed)
Anesthesia Evaluation  Patient identified by MRN, date of birth, ID band Patient awake    Reviewed: Allergy & Precautions, NPO status , Patient's Chart, lab work & pertinent test results  Airway Mallampati: I  TM Distance: >3 FB Neck ROM: Full    Dental  (+) Dental Advisory Given, Teeth Intact   Pulmonary sleep apnea and Continuous Positive Airway Pressure Ventilation , neg COPD, former smoker,    Pulmonary exam normal breath sounds clear to auscultation       Cardiovascular hypertension, Pt. on medications (-) Past MI Normal cardiovascular exam Rhythm:Regular Rate:Normal  1st degree AV block. RBBB   Neuro/Psych TIAnegative psych ROS   GI/Hepatic Neg liver ROS, GERD  Medicated and Controlled,  Endo/Other  diabetes, Well Controlled, Type 2  Renal/GU Renal diseaseRight renal neoplasm     Musculoskeletal  (+) Arthritis ,   Abdominal (+) + obese,   Peds  Hematology negative hematology ROS (+)   Anesthesia Other Findings   Reproductive/Obstetrics                             Lab Results  Component Value Date   WBC 5.3 06/19/2019   HGB 14.6 06/19/2019   HCT 44.0 06/19/2019   MCV 91.3 06/19/2019   PLT 209 06/19/2019   Lab Results  Component Value Date   CREATININE 1.08 06/19/2019   BUN 14 06/19/2019   NA 141 06/19/2019   K 4.2 06/19/2019   CL 105 06/19/2019   CO2 28 06/19/2019    Anesthesia Physical  Anesthesia Plan  ASA: III  Anesthesia Plan: General   Post-op Pain Management:    Induction: Intravenous  PONV Risk Score and Plan: 3 and Ondansetron, Dexamethasone, Midazolam and Treatment may vary due to age or medical condition  Airway Management Planned: LMA  Additional Equipment: None  Intra-op Plan:   Post-operative Plan: Extubation in OR  Informed Consent: I have reviewed the patients History and Physical, chart, labs and discussed the procedure including the risks,  benefits and alternatives for the proposed anesthesia with the patient or authorized representative who has indicated his/her understanding and acceptance.     Dental advisory given  Plan Discussed with: CRNA  Anesthesia Plan Comments:         Anesthesia Quick Evaluation

## 2019-06-20 NOTE — Op Note (Signed)
Preoperative diagnosis: 1.  Bladder calculus 2.  Left ureteral calculus  Postoperative diagnosis: 1.  Bladder calculus 2.  Left ureteral calculus  Procedures: 1.  Cystoscopy 2.  Left retrograde pyelography with interpretation 3.  Left ureteroscopy with stone removal 4.  Left ureteral stent placement (6 x 26 with string) 5.  Laser lithotripsy and removal of bladder calculus (2.5 cm)  Surgeon: Roxy Horseman, Brooke Bonito MD  Anesthesia: General  Complications: None  EBL: Minimal  Specimens: 1.  Left ureteral calculus 2.  Bladder calculus  Disposition of specimens: [facility]  Intraoperative findings: Left retrograde pyelography was performed with Omnipaque contrast with a 6 French ureteral catheter.  This demonstrated a filling defect within the mid ureter consistent with the patient's known calculus.  Indication: [name] is a 68 year old gentleman with multiple urologic issues.  He was recently noted to have a left ureteral calculus and a large bladder calculus.  Although asymptomatic, he was unable to pass his stone and presents today for definitive treatment.  After reviewing options, he elected to proceed with the above procedures.  The potential risks, complications, and the expected recovery process were discussed in detail.  Informed consent was obtained.  Description of procedure: Patient was taken the operating room and a general anesthetic was administered.  He was given preoperative antibiotics, placed in the dorsolithotomy position, and prepped and draped in usual sterile fashion.  Next, a preoperative timeout was performed.  Cystourethroscopy was performed and revealed a very large prostate with a large median lobe that was friable and easy to bleed.  Inspection of the bladder revealed the patient's calculus without other concerning findings.  Specifically, no bladder tumors or other mucosal abnormalities were identified.  The left ureteral orifice was then  identified and intubated with a 6 French ureteral catheter.  Omnipaque contrast was injected in a retrograde pyelogram was performed with findings as dictated above.  A 0.38 sensor guidewire was then advanced up into the left renal pelvis under fluoroscopic guidance.  The cystoscope was then removed and a semirigid 6 French ureteroscope was advanced next to the wire.  A second wire was used to help stent open the ureter to allow passage of the ureteroscope up to the level of the stone.  Once the stone was identified, it was felt that it could be removed intact and this was performed with an N gauge basket.  The wire was left in place and attention then turned to the bladder calculus.  It was decided to perform laser lithotripsy of the stone.  Using a 550 m holmium laser fiber, the stone was fragmented into smaller pieces.  The stone was noted to be quite dense.  The largest piece was then able to be removed with the rigid biopsy forceps.  The remaining stone fragments were irrigated from the bladder.  Attention then turned back to the wire in the left ureter.  This was backloaded on the cystoscope and a 6 x 26 double-J ureteral stent was advanced over the wire using Seldinger technique.  It was positioned appropriately under fluoroscopic and cystoscopic guidance.  The wire was removed with a stent noted in good position.  The bladder was emptied and the procedure was ended.  Intraurethral lidocaine was administered.  He tolerated the procedure well without complications.  He was able to be awakened and transferred to the recovery unit in satisfactory condition.

## 2019-06-20 NOTE — Anesthesia Postprocedure Evaluation (Signed)
Anesthesia Post Note  Patient: Marvin Rivas  Procedure(s) Performed: CYSTOSCOPY/ RETROGRADE/URETEROSCOPY/HOLMIUM LASER/STENT PLACEMENT/ CYSTOLITHALOPAXY (Left ) HOLMIUM LASER APPLICATION (Left )     Patient location during evaluation: PACU Anesthesia Type: General Level of consciousness: awake Pain management: pain level controlled Vital Signs Assessment: post-procedure vital signs reviewed and stable Respiratory status: spontaneous breathing Cardiovascular status: stable Postop Assessment: no apparent nausea or vomiting Anesthetic complications: no    Last Vitals:  Vitals:   06/20/19 1030 06/20/19 1045  BP: (!) 149/61   Pulse: 77   Resp: 12 15  Temp: 36.7 C   SpO2: 95%     Last Pain:  Vitals:   06/20/19 1030  TempSrc:   PainSc: 2    Pain Goal:                   Huston Foley

## 2019-06-20 NOTE — Transfer of Care (Signed)
Immediate Anesthesia Transfer of Care Note  Patient: Marvin Rivas  Procedure(s) Performed: CYSTOSCOPY/ RETROGRADE/URETEROSCOPY/HOLMIUM LASER/STENT PLACEMENT/ CYSTOLITHALOPAXY (Left ) HOLMIUM LASER APPLICATION (Left )  Patient Location: PACU  Anesthesia Type:General  Level of Consciousness: awake, alert  and oriented  Airway & Oxygen Therapy: Patient Spontanous Breathing and Patient connected to face mask oxygen  Post-op Assessment: Report given to RN and Post -op Vital signs reviewed and stable  Post vital signs: Reviewed and stable  Last Vitals:  Vitals Value Taken Time  BP    Temp    Pulse    Resp    SpO2      Last Pain:  Vitals:   06/20/19 0659  TempSrc:   PainSc: 0-No pain         Complications: No apparent anesthesia complications

## 2019-06-20 NOTE — Anesthesia Procedure Notes (Addendum)
Procedure Name: LMA Insertion Date/Time: 06/20/2019 8:49 AM Performed by: Maxwell Caul, CRNA Pre-anesthesia Checklist: Patient identified, Emergency Drugs available, Suction available, Patient being monitored and Timeout performed Patient Re-evaluated:Patient Re-evaluated prior to induction Oxygen Delivery Method: Circle system utilized Preoxygenation: Pre-oxygenation with 100% oxygen Induction Type: IV induction Ventilation: Mask ventilation without difficulty LMA: LMA inserted LMA Size: 5.0 Tube secured with: Tape Dental Injury: Teeth and Oropharynx as per pre-operative assessment

## 2019-06-21 ENCOUNTER — Encounter (HOSPITAL_COMMUNITY): Payer: Self-pay | Admitting: Urology

## 2019-07-30 ENCOUNTER — Other Ambulatory Visit: Payer: Self-pay | Admitting: Urology

## 2019-07-30 ENCOUNTER — Other Ambulatory Visit: Payer: Self-pay | Admitting: *Deleted

## 2019-07-30 MED ORDER — GABAPENTIN 600 MG PO TABS: 600.0000 mg | ORAL_TABLET | Freq: Four times a day (QID) | ORAL | 1 refills | Status: DC

## 2019-08-05 ENCOUNTER — Ambulatory Visit: Payer: Medicare Other | Admitting: Neurology

## 2019-08-12 NOTE — Patient Instructions (Signed)
DUE TO COVID-19 ONLY ONE VISITOR IS ALLOWED TO COME WITH YOU AND STAY IN THE WAITING ROOM ONLY DURING PRE OP AND PROCEDURE DAY OF SURGERY. THE 1 VISITOR MAY VISIT WITH YOU AFTER SURGERY IN YOUR PRIVATE ROOM DURING VISITING HOURS ONLY!  YOU NEED TO HAVE A COVID 19 TEST ON_______ @_______ , THIS TEST MUST BE DONE BEFORE SURGERY, COME  [address] [place] , [id number].  ([place]) ONCE YOUR COVID TEST IS COMPLETED, PLEASE BEGIN THE QUARANTINE INSTRUCTIONS AS OUTLINED IN YOUR HANDOUT.                DURREL OSTROM  08/12/2019   Your procedure is scheduled on: 08-19-19   Report to [facility] Main  Entrance   Report to short stay  at      0530 AM     Call this number if you have problems the morning of surgery [phone]    Remember: Do not eat food or drink liquids :After Midnight.   BRUSH YOUR TEETH MORNING OF SURGERY AND RINSE YOUR MOUTH OUT, NO CHEWING GUM CANDY OR MINTS.   ONE BOTTLE OF MAGNESIUM CITRATE BY NOON THE DAY BEFORE SURGERY PER MD ORDER ONE FLEETS ENEMA THE NIGHT BEFORE SURGERY   Take these medicines the morning of surgery with A SIP OF WATER: finasteride, gabapentin, flomax, hydrocodone if needed  DO NOT TAKE ANY DIABETIC MEDICATIONS DAY OF YOUR SURGERY                               You may not have any metal on your body including hair pins and              piercings  Do not wear jewelry,  lotions, powders or perfumes, deodorant              Men may shave face and neck.   Do not bring valuables to the hospital. [place].  Contacts, dentures or bridgework may not be worn into surgery.     Special Instructions: N/A              Please read over the following fact sheets you were given: _____________________________________________________________________           [facility] - Preparing for Surgery Before surgery, you can play an important role.  Because skin is not sterile,  your skin needs to be as free of germs as possible.  You can reduce the number of germs on your skin by washing with CHG (chlorahexidine gluconate) soap before surgery.  CHG is an antiseptic cleaner which kills germs and bonds with the skin to continue killing germs even after washing. Please DO NOT use if you have an allergy to CHG or antibacterial soaps.  If your skin becomes reddened/irritated stop using the CHG and inform your nurse when you arrive at Short Stay. Do not shave (including legs and underarms) for at least 48 hours prior to the first CHG shower.  You may shave your face/neck. Please follow these instructions carefully:  1.  Shower with CHG Soap the night before surgery and the  morning of Surgery.  2.  If you choose to wash your hair, wash your hair first as usual with your  normal  shampoo.  3.  After you shampoo, rinse your hair  and body thoroughly to remove the  shampoo.                           4.  Use CHG as you would any other liquid soap.  You can apply chg directly  to the skin and wash                       Gently with a scrungie or clean washcloth.  5.  Apply the CHG Soap to your body ONLY FROM THE NECK DOWN.   Do not use on face/ open                           Wound or open sores. Avoid contact with eyes, ears mouth and genitals (private parts).                       Wash face,  Genitals (private parts) with your normal soap.             6.  Wash thoroughly, paying special attention to the area where your surgery  will be performed.  7.  Thoroughly rinse your body with warm water from the neck down.  8.  DO NOT shower/wash with your normal soap after using and rinsing off  the CHG Soap.                9.  Pat yourself dry with a clean towel.            10.  Wear clean pajamas.            11.  Place clean sheets on your bed the night of your first shower and do not  sleep with pets. Day of Surgery : Do not apply any lotions/deodorants the morning of surgery.  Please wear  clean clothes to the hospital/surgery center.  FAILURE TO FOLLOW THESE INSTRUCTIONS MAY RESULT IN THE CANCELLATION OF YOUR SURGERY PATIENT SIGNATURE_________________________________  NURSE SIGNATURE__________________________________  ________________________________________________________________________   [name]  An incentive spirometer is a tool that can help keep your lungs clear and active. This tool measures how well you are filling your lungs with each breath. Taking long deep breaths may help reverse or decrease the chance of developing breathing (pulmonary) problems (especially infection) following:  A long period of time when you are unable to move or be active. BEFORE THE PROCEDURE   If the spirometer includes an indicator to show your best effort, your nurse or respiratory therapist will set it to a desired goal.  If possible, sit up straight or lean slightly forward. Try not to slouch.  Hold the incentive spirometer in an upright position. INSTRUCTIONS FOR USE  1. Sit on the edge of your bed if possible, or sit up as far as you can in bed or on a chair. 2. Hold the incentive spirometer in an upright position. 3. Breathe out normally. 4. Place the mouthpiece in your mouth and seal your lips tightly around it. 5. Breathe in slowly and as deeply as possible, raising the piston or the ball toward the top of the column. 6. Hold your breath for 3-5 seconds or for as long as possible. Allow the piston or ball to fall to the bottom of the column. 7. Remove the mouthpiece from your mouth and breathe out normally. 8. Rest for a few seconds and repeat Steps 1  through 7 at least 10 times every 1-2 hours when you are awake. Take your time and take a few normal breaths between deep breaths. 9. The spirometer may include an indicator to show your best effort. Use the indicator as a goal to work toward during each repetition. 10. After each set of 10 deep breaths, practice  coughing to be sure your lungs are clear. If you have an incision (the cut made at the time of surgery), support your incision when coughing by placing a pillow or rolled up towels firmly against it. Once you are able to get out of bed, walk around indoors and cough well. You may stop using the incentive spirometer when instructed by your caregiver.  RISKS AND COMPLICATIONS  Take your time so you do not get dizzy or light-headed.  If you are in pain, you may need to take or ask for pain medication before doing incentive spirometry. It is harder to take a deep breath if you are having pain. AFTER USE  Rest and breathe slowly and easily.  It can be helpful to keep track of a log of your progress. Your caregiver can provide you with a simple table to help with this. If you are using the spirometer at home, follow these instructions: [place] IF:   You are having difficultly using the spirometer.  You have trouble using the spirometer as often as instructed.  Your pain medication is not giving enough relief while using the spirometer.  You develop fever of 100.5 F (38.1 C) or higher. SEEK IMMEDIATE MEDICAL CARE IF:   You cough up bloody sputum that had not been present before.  You develop fever of 102 F (38.9 C) or greater.  You develop worsening pain at or near the incision site. MAKE SURE YOU:   Understand these instructions.  Will watch your condition.  Will get help right away if you are not doing well or get worse. Document Released: 02/27/2007 Document Revised: 01/09/2012 Document Reviewed: 04/30/2007 ExitCare Patient Information 2014 Exit[employer], [place].   ________________________________________________________________________  WHAT IS A BLOOD TRANSFUSION? Blood Transfusion Information  A transfusion is the replacement of blood or some of its parts. Blood is made up of multiple cells which provide different functions.  Red blood cells carry oxygen and are  used for blood loss replacement.  White blood cells fight against infection.  Platelets control bleeding.  Plasma helps clot blood.  Other blood products are available for specialized needs, such as hemophilia or other clotting disorders. BEFORE THE TRANSFUSION  Who gives blood for transfusions?   Healthy volunteers who are fully evaluated to make sure their blood is safe. This is blood bank blood. Transfusion therapy is the safest it has ever been in the practice of medicine. Before blood is taken from a donor, a complete history is taken to make sure that person has no history of diseases nor engages in risky social behavior (examples are intravenous drug use or sexual activity with multiple partners). The donor's travel history is screened to minimize risk of transmitting infections, such as malaria. The donated blood is tested for signs of infectious diseases, such as HIV and hepatitis. The blood is then tested to be sure it is compatible with you in order to minimize the chance of a transfusion reaction. If you or a relative donates blood, this is often done in anticipation of surgery and is not appropriate for emergency situations. It takes many days to process the donated blood. RISKS AND  COMPLICATIONS Although transfusion therapy is very safe and saves many lives, the main dangers of transfusion include:   Getting an infectious disease.  Developing a transfusion reaction. This is an allergic reaction to something in the blood you were given. Every precaution is taken to prevent this. The decision to have a blood transfusion has been considered carefully by your caregiver before blood is given. Blood is not given unless the benefits outweigh the risks. AFTER THE TRANSFUSION  Right after receiving a blood transfusion, you will usually feel much better and more energetic. This is especially true if your red blood cells have gotten low (anemic). The transfusion raises the level of the red  blood cells which carry oxygen, and this usually causes an energy increase.  The nurse administering the transfusion will monitor you carefully for complications. HOME CARE INSTRUCTIONS  No special instructions are needed after a transfusion. You may find your energy is better. Speak with your caregiver about any limitations on activity for underlying diseases you may have. SEEK MEDICAL CARE IF:   Your condition is not improving after your transfusion.  You develop redness or irritation at the intravenous (IV) site. SEEK IMMEDIATE MEDICAL CARE IF:  Any of the following symptoms occur over the next 12 hours:  Shaking chills.  You have a temperature by mouth above 102 F (38.9 C), not controlled by medicine.  Chest, back, or muscle pain.  People around you feel you are not acting correctly or are confused.  Shortness of breath or difficulty breathing.  Dizziness and fainting.  You get a rash or develop hives.  You have a decrease in urine output.  Your urine turns a dark color or changes to pink, red, or brown. Any of the following symptoms occur over the next 10 days:  You have a temperature by mouth above 102 F (38.9 C), not controlled by medicine.  Shortness of breath.  Weakness after normal activity.  The white part of the eye turns yellow (jaundice).  You have a decrease in the amount of urine or are urinating less often.  Your urine turns a dark color or changes to pink, red, or brown. Document Released: 10/14/2000 Document Revised: 01/09/2012 Document Reviewed: 06/02/2008 [facility] Patient Information 2014 [place], [place].  _______________________________________________________________________

## 2019-08-13 ENCOUNTER — Encounter (HOSPITAL_COMMUNITY): Payer: Self-pay

## 2019-08-13 ENCOUNTER — Other Ambulatory Visit: Payer: Self-pay

## 2019-08-13 ENCOUNTER — Encounter (HOSPITAL_COMMUNITY)
Admission: RE | Admit: 2019-08-13 | Discharge: 2019-08-13 | Disposition: A | Discharge status: Home / Self Care | Payer: Medicare Other | Source: Ambulatory Visit | Attending: Urology | Admitting: Urology

## 2019-08-13 DIAGNOSIS — I10 Essential (primary) hypertension: Secondary | ICD-10-CM | POA: Insufficient documentation

## 2019-08-13 DIAGNOSIS — K219 Gastro-esophageal reflux disease without esophagitis: Secondary | ICD-10-CM | POA: Diagnosis not present

## 2019-08-13 DIAGNOSIS — Z79899 Other long term (current) drug therapy: Secondary | ICD-10-CM | POA: Diagnosis not present

## 2019-08-13 DIAGNOSIS — G4730 Sleep apnea, unspecified: Secondary | ICD-10-CM | POA: Insufficient documentation

## 2019-08-13 DIAGNOSIS — Z01812 Encounter for preprocedural laboratory examination: Secondary | ICD-10-CM | POA: Insufficient documentation

## 2019-08-13 DIAGNOSIS — Z7982 Long term (current) use of aspirin: Secondary | ICD-10-CM | POA: Insufficient documentation

## 2019-08-13 DIAGNOSIS — E785 Hyperlipidemia, unspecified: Secondary | ICD-10-CM | POA: Insufficient documentation

## 2019-08-13 DIAGNOSIS — C61 Malignant neoplasm of prostate: Secondary | ICD-10-CM | POA: Insufficient documentation

## 2019-08-13 HISTORY — DX: Personal history of urinary calculi: Z87.442

## 2019-08-13 HISTORY — DX: Malignant (primary) neoplasm, unspecified: C80.1

## 2019-08-13 LAB — HEMOGLOBIN A1C
Hgb A1c MFr Bld: 5.5 % (ref 4.8–5.6)
Mean Plasma Glucose: 111.15 mg/dL

## 2019-08-13 LAB — BASIC METABOLIC PANEL
Anion gap: 9 (ref 5–15)
BUN: 15 mg/dL (ref 8–23)
CO2: 28 mmol/L (ref 22–32)
Calcium: 9.3 mg/dL (ref 8.9–10.3)
Chloride: 102 mmol/L (ref 98–111)
Creatinine, Ser: 1.13 mg/dL (ref 0.61–1.24)
GFR calc Af Amer: >60 mL/min (ref >60)
GFR calc non Af Amer: >60 mL/min (ref >60)
Glucose, Bld: 102 mg/dL — ABNORMAL HIGH (ref 70–99)
Potassium: 4.4 mmol/L (ref 3.5–5.1)
Sodium: 139 mmol/L (ref 135–145)

## 2019-08-13 LAB — CBC
HCT: 42.5 % (ref 39.0–52.0)
Hemoglobin: 14.2 g/dL (ref 13.0–17.0)
MCH: 30.5 pg (ref 26.0–34.0)
MCHC: 33.4 g/dL (ref 30.0–36.0)
MCV: 91.2 fL (ref 80.0–100.0)
Platelets: 196 10*3/uL (ref 150–400)
RBC: 4.66 MIL/uL (ref 4.22–5.81)
RDW: 13.2 % (ref 11.5–15.5)
WBC: 4.9 10*3/uL (ref 4.0–10.5)
nRBC: 0 % (ref 0.0–0.2)

## 2019-08-13 LAB — GLUCOSE, CAPILLARY: Glucose-Capillary: 100 mg/dL — ABNORMAL HIGH (ref 70–99)

## 2019-08-13 NOTE — Progress Notes (Signed)
PCP - Gaynelle Arabian Cardiologist -   Chest x-ray -  EKG - 06-19-19 epic Stress Test -  ECHO -  Cardiac Cath -   Sleep Study -  CPAP -   Fasting Blood Sugar - 91-98   No DM meds Checks Blood Sugar __1-2___ times a day  Blood Thinner Instructions: Aspirin Instructions: 81 mg   Last Dose:08-11-19  Anesthesia review: OSA Cpap  Patient denies shortness of breath, fever, cough and chest pain at PAT appointment   Patient verbalized understanding of instructions that were given to them at the PAT appointment. Patient was also instructed that they will need to review over the PAT instructions again at home before surgery.

## 2019-08-14 NOTE — H&P (Signed)
  Office Visit Report     07/23/2019   --------------------------------------------------------------------------------   Marvin Rivas  MRN: [id number]  DOB: 08/14/1951, 68 year old Male  SSN: [id number]   PRIMARY CARE:  Marvin R. Ehinger, MD  REFERRING:  Marvin Rivas, Jr, Rivas  PROVIDER:  Lester Rivas, Rivas.D.  LOCATION:  [facility] - [id number]     --------------------------------------------------------------------------------   CC/HPI: CC: Prostate Cancer   PCP: Marvin Rivas   Marvin Rivas is a 68 year old gentleman that I have taken care of for locally advanced renal cell carcinoma s/p right robotic partial nephrectomy in November 2018. He has remained disease free and continues with surveillance folllow up. He also has a history of urolithiasis and his s/p recent ureteroscopic stone treatment. In addition, he has a very enlarged prostate measuring over 120 cc with history of postoperative retention episodes and bladder stone formation (recently treated at the time of his August ureteroscopy). He is on maximal medical therapy with finasteride and tamsulosin.   He also has a long standing history of an elevated PSA. His father had prostate cancer diagnosed in his 60s and was treated with radiation but unfortunately developed a recurrence and died of metastatic prostate cancer in his 80s. Marvin Rivas had a biopsy in 2007 when his PSA was 7.57. 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 His baseline PSA on finasteride was between 4 and 5. However, his PSA recently increased to above 7 and was repeated and had further increased to 8.53. This prompted a TRUS biopsy of the prostate on 07/02/19. He also had an MRI of the prostate in 2019 that did not show an obvious lesion suspicious for cancer but possible findings that may have indicated inflammation. He was reluctant to consider a biopsy at that time but proceeded to the further increase noted this  summer. 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   Family history: Father (see above)   Imaging studies: MRI (2019)   PMH: He has a history of hyperlipidemia, hypertension, GERD, diabetes, sleep apnea (uses CPAP), a distant history of TIA.  PSH: Lumbar fusion, right robotic partial nephrectomy.   TNM stage: cT1c Nx Mx  PSA: 8.53 (on 5 ARI)  Gleason score: 3+4=7  Biopsy (07/02/19): 3/12 cores positive  Left: L lateral base (20%, 3+3=6), 10%, 3+4=7, PNI)  Right: R lateral apex (30%, 3+4=7, PNI)  Prostate volume: 126.3 cc  PSAD: 0.13   Nomogram  OC disease: 40%  EPE: 56%  SVI: 6%  LNI: 6%  PFS (5 year, 10 year): 71%, 56%   Urinary function: IPSS is 18. He is on combination medical therapy with tamsulosin and finasteride. He has had recent episodes of postoperative retention.  Erectile function: SHIM score is 7. He is sexually inactive in this is a lower priority to him and his wife.     ALLERGIES: No Allergies    MEDICATIONS: Hydrocodone-Acetaminophen 5 mg-325 mg tablet 1-2 tablet PO Q 6 H  Aspir 81  ATORVASTATIN CALCIUM PO Weekly  Finasteride 5 MG Oral Tablet Oral  Gabapentin 600 mg tablet Oral  LOSARTAN-HYDROCHLOROTHIAZIDE PO Daily  Meloxicam 15 mg tablet  RANITIDINE HCL PO Daily  Tamsulosin HCl - 0.4 MG Oral Capsule 1 Oral Daily     GU PSH: Cysto Bladder Stone >2.5cm - 06/20/2019 Cystoscopy Insert Stent, Left - 06/20/2019 Locm 300-399Mg/Ml Iodine,1Ml - 05/17/2019, 10/17/2018, 04/02/2018, 08/04/2017 Partial nephrectomy (laparoscopic), Right -   09/14/2017 Prostate Needle Biopsy - 07/02/2019 Ureteroscopic stone removal, Left - 06/20/2019       PSH Notes: Exploration Of Spinal Fusion, Back Surgery   NON-GU PSH: Back Surgery (Unspecified) Explore Spinal Fusion - 2011 Surgical Pathology, Gross And Microscopic Examination For Prostate Needle - 07/02/2019     GU PMH: Bladder Stone - 05/31/2019 Ureteral calculus -  05/31/2019 Renal cell carcinoma, right - 10/11/2017 Weak Urinary Stream - 10/11/2017 Urinary Retention (Stable) - 07/24/2017, - 2018 Right renal neoplasm - 2018 BPH w/LUTS, Benign prostatic hyperplasia with urinary obstruction - 2014 BPH w/o LUTS, Benign prostatic hypertrophy without lower urinary tract symptoms - 2014 CIS of prostate/prostatic urethra, PIN III (prostatic intraepithelial neoplasia III) - 2014 Dysuria, Dysuria - 2014 ED due to arterial insufficiency, Erectile dysfunction due to arterial insufficiency - 2014 Elevated PSA, Elevated prostate specific antigen (PSA) - 2014, Elevated prostate specific antigen (PSA), - 2014 History of urolithiasis, Nephrolithiasis - 2014 Nocturia, Nocturia - 2014 Urinary Urgency, Urinary urgency - 2014      PMH Notes:   ** His medical history is significant for diabetes, hypertension, sleep apnea (he uses CPAP), and acid reflux and a history of a TIA multiple years ago. He also has multiple issues related to arthritis and has had multiple lumbar back surgeries including lumbar back surgery with what sounds like a fusion performed on 07/11/17.   1) BPH/LUTS: After lumbar back surgery in September 2018, he developed acute urinary retention and presented to the emergency department on 07/15/17 with overflow incontinence and urinary frequency. He had a urinary catheter placed that relieved his discomfort immediately.   Current treatment: Finasteride and tamsulosin   Aug 2020: Removal of large bladder calculus   2) Renal cell carcinoma: He is s/p right RAL partial nephrectomy on 09/14/17 for a 6.0 cm right renal mass.   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 His PSA was 7.57 in 2007 prompting him to undergo a prostate biopsy at that time that did not demonstrate malignancy but did  demonstrate high-grade PIN. He has a strong family history of prostate cancer with his father having been diagnosed in his late 60s. His father was treated with primary radiation therapy and developed recurrence and ultimately died in his late 80s of metastatic prostate cancer. His PSA has since fluctuated. This was last checked in 2014 when it was noted to be just over 4 although he was on finasteride at that time. He has been quite reluctant to consider a repeat biopsy.   Jul 2019: MRI - unremarkable (possible inflammation)  Aug 2020: 12 core biopsy -   4) Urolithiasis: He does have nonobstructing bilateral urolithiasis noted on his CT scan from 07/15/17. He has not been symptomatic related to the stones.   Aug 2020: L ureteroscopic stone removal ( )     NON-GU PMH: Pyuria/other UA findings - 07/02/2019 Arthritis Diabetes Type 2 GERD Glaucoma Hypercholesterolemia Hypertension Sleep Apnea Stroke/TIA    FAMILY HISTORY: 1 Daughter - Daughter 1 son - Son Death In The Family Father - Runs In Family nephrolithiasis - Father Prostate Cancer - Father   SOCIAL HISTORY: Marital Status: Married Preferred Language: English Current Smoking Status: Patient does not smoke anymore.   Tobacco Use Assessment Completed: Used Tobacco in last 30 days? Does not use smokeless tobacco. Has never drank.    Does not use drugs. Drinks 3 caffeinated drinks per day. Has not had a blood transfusion. 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     Notes: Former smoker, Occupation:, Alcohol Use, Marital History - Currently Married, Caffeine Use   REVIEW OF SYSTEMS:    GU Review Male:   Patient reports burning/ pain with urination, leakage of urine, stream starts and stops, and trouble starting your streams. Patient denies frequent urination, hard to postpone urination, get up at night to urinate, and have to strain to urinate .  Gastrointestinal (Upper):   Patient denies nausea and vomiting.   Gastrointestinal (Lower):   Patient denies diarrhea and constipation.  Constitutional:   Patient denies fever, night sweats, fatigue, and weight loss.  Skin:   Patient denies skin rash/ lesion and itching.  Eyes:   Patient denies blurred vision and double vision.  Ears/ Nose/ Throat:   Patient denies sore throat and sinus problems.  Hematologic/Lymphatic:   Patient denies swollen glands and easy bruising.  Cardiovascular:   Patient denies leg swelling and chest pains.  Respiratory:   Patient denies cough and shortness of breath.  Endocrine:   Patient denies excessive thirst.  Musculoskeletal:   Patient reports back pain and joint pain.   Neurological:   Patient denies headaches and dizziness.  Psychologic:   Patient denies depression and anxiety.   VITAL SIGNS:      07/23/2019 08:06 AM  Weight 250 lb / 113.4 kg  Height 69 in / 175.26 cm  BP 116/66 mmHg  Pulse 61 /min  Temperature 97.3 F / 36.2 C  BMI 36.9 kg/Rivas   MULTI-SYSTEM PHYSICAL EXAMINATION:    Constitutional: Well-nourished. No physical deformities. Normally developed. Good grooming.  Neck: Neck symmetrical, not swollen. Normal tracheal position.  Respiratory: No labored breathing, no use of accessory muscles. Clear bilaterally.  Cardiovascular: Normal temperature, normal extremity pulses, no swelling, no varicosities. Regular rate and rhythm.  Lymphatic: No enlargement of neck, axillae, groin.  Skin: No paleness, no jaundice, no cyanosis. No lesion, no ulcer, no rash.  Neurologic / Psychiatric: Oriented to time, oriented to place, oriented to person. No depression, no anxiety, no agitation.  Gastrointestinal: No mass, no tenderness, no rigidity, non obese abdomen. Laparoscopic incisions well healed.  Eyes: Normal conjunctivae. Normal eyelids.  Ears, Nose, Mouth, and Throat: Left ear no scars, no lesions, no masses. Right ear no scars, no lesions, no masses. Nose no scars, no lesions, no masses. Normal hearing. Normal lips.   Musculoskeletal: Normal gait and station of head and neck.     PAST DATA REVIEWED:  Source Of History:  Patient  Lab Test Review:   PSA  Records Review:   Pathology Reports, Previous Patient Records  Urine Test Review:   Urinalysis   05/13/19 05/03/19 10/11/18 07/23/18 03/29/18 02/14/13 08/10/12 12/23/11  PSA  Total PSA 8.53 ng/mL 7.15 ng/mL 4.83 ng/mL 4.14 ng/mL 5.44 ng/mL 4.39  4.70  5.23   Free PSA 2.42 ng/mL 1.88 ng/mL   1.74 ng/mL     % Free PSA 28 % PSA 26 % PSA   32 % PSA       09/05/06  Hormones  Testosterone, Total 2.68     PROCEDURES:          Urinalysis w/Scope Dipstick Dipstick Cont'd Micro  Color: Yellow Bilirubin: Neg mg/dL WBC/hpf: 20 - 40/hpf  Appearance: Clear Ketones: Neg mg/dL RBC/hpf: 3 - 10/hpf  Specific Gravity: <=1.005 Blood: 1+ ery/uL Bacteria: Few (10-25/hpf)  pH: 7.5 Protein: Neg   mg/dL Cystals: NS (Not Seen)  Glucose: Neg mg/dL Urobilinogen: 0.2 mg/dL Casts: NS (Not Seen)    Nitrites: Neg Trichomonas: Not Present    Leukocyte Esterase: 3+ leu/uL Mucous: Not Present      Epithelial Cells: 0 - 5/hpf      Yeast: NS (Not Seen)      Sperm: Not Present    ASSESSMENT:      ICD-10 Details  1 GU:   Prostate Cancer - C61    PLAN:           Orders Labs Urine Culture          Schedule Return Visit/Planned Activity: Other See Visit Notes             Note: Will call to schedule surgery.  Return Visit/Planned Activity: Next Available Appointment - PT/OT Referral             Note: Please schedule patient for preoperative PT prior to radical prostatectomy.            Document Letter(s):  Created for Patient: Clinical Summary         Notes:   1. Intermediate risk prostate cancer: I had a long and detailed discussion with Mr. Wilmouth and his wife today. He was provided educational information prior to his appointment today. The patient was counseled about the natural history of prostate cancer and the standard treatment options that are available for  prostate cancer. It was explained to him how his age and life expectancy, clinical stage, Gleason score, and PSA affect his prognosis, the decision to proceed with additional staging studies, as well as how that information influences recommended treatment strategies. We discussed the roles for active surveillance, radiation therapy, surgical therapy, androgen deprivation, as well as ablative therapy options for the treatment of prostate cancer as appropriate to his individual cancer situation. We discussed the risks and benefits of these options with regard to their impact on cancer control and also in terms of potential adverse events, complications, and impact on quality of life particularly related to urinary and sexual function. The patient was encouraged to ask questions throughout the discussion today and all questions were answered to his stated satisfaction. In addition, the patient was provided with and/or directed to appropriate resources and literature for further education about prostate cancer and treatment options. We discussed surgical therapy for prostate cancer including the different available surgical approaches. We discussed, in detail, the risks and expectations of surgery with regard to cancer control, urinary control, and erectile function as well as the expected postoperative recovery process. Additional risks of surgery including but not limited to bleeding, infection, hernia formation, nerve damage, lymphocele formation, bowel/rectal injury potentially necessitating colostomy, damage to the urinary tract resulting in urine leakage, urethral stricture, and the cardiopulmonary risks such as myocardial infarction, stroke, death, venothromboembolism, etc. were explained. The risk of open surgical conversion for robotic/laparoscopic prostatectomy was also discussed.   After reviewing his options, he is most interested in treatment rather than active surveillance after reviewing the pros and  cons of each of these approaches. With regard to treatment, and considering his very large prostate and significant urinary symptoms despite maximal medical therapy and recent episodes of urinary retention, he is more interested in proceeding with surgical therapy in addressing his BPH as well as his prostate cancer. He does wish to be scheduled for a bilateral nerve-sparing robot assisted laparoscopic radical prostatectomy and bilateral pelvic lymphadenectomy.   2. BPH/LUTS: He will continue current medical   therapy. He understands that these symptoms should be addressed following prostatectomy. He does understand the risk of incontinence but feels that the benefits to outweigh the risks.   3. Locally advanced renal cell carcinoma: He is scheduled for routine follow-up in January for ongoing surveillance.   4. Urolithiasis: He likely will benefit from a a metabolic evaluation in the future following recovery of his continence after prostatectomy.   Cc: Marvin Rivas         Next Appointment:      Next Appointment: 08/15/2019 11:00 AM    Appointment Type: 60 Physical Therapy    Location: [facility] - [id number]    Provider: Elizabeth Fink    Reason for Visit: initial pt ot      E & Rivas CODE: I spent at least 42 minutes face to face with the patient, more than 50% of that time was spent on counseling and/or coordinating care.     * Signed by Marvin Rivas.D. on 07/23/19 at 3:34 PM (EDT)*     

## 2019-08-15 ENCOUNTER — Other Ambulatory Visit (HOSPITAL_COMMUNITY)
Admission: RE | Admit: 2019-08-15 | Discharge: 2019-08-15 | Disposition: A | Discharge status: Home / Self Care | Payer: Medicare Other | Source: Ambulatory Visit | Attending: Urology | Admitting: Urology

## 2019-08-15 DIAGNOSIS — Z01812 Encounter for preprocedural laboratory examination: Secondary | ICD-10-CM | POA: Insufficient documentation

## 2019-08-15 DIAGNOSIS — Z20828 Contact with and (suspected) exposure to other viral communicable diseases: Secondary | ICD-10-CM | POA: Diagnosis not present

## 2019-08-16 ENCOUNTER — Other Ambulatory Visit: Payer: Self-pay | Admitting: Urology

## 2019-08-17 LAB — NOVEL CORONAVIRUS, NAA (HOSP ORDER, SEND-OUT TO REF LAB; TAT 18-24 HRS): SARS-CoV-2, NAA: NOT DETECTED

## 2019-08-18 NOTE — Anesthesia Preprocedure Evaluation (Addendum)
Anesthesia Evaluation  Patient identified by MRN, date of birth, ID band Patient awake    Reviewed: Allergy & Precautions, NPO status , Patient's Chart, lab work & pertinent test results  Airway Mallampati: II  TM Distance: >3 FB Neck ROM: Full    Dental no notable dental hx. (+) Teeth Intact   Pulmonary sleep apnea and Continuous Positive Airway Pressure Ventilation , former smoker,    Pulmonary exam normal breath sounds clear to auscultation       Cardiovascular Exercise Tolerance: Good hypertension, Pt. 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   Neuro/Psych TIA Neuromuscular disease negative psych ROS   GI/Hepatic Neg liver ROS, GERD  ,  Endo/Other  diabetes, Type 2  Renal/GU Renal diseaseK+ 4.4 Cr 1.13     Musculoskeletal  (+) Arthritis ,   Abdominal (+) + obese,   Peds  Hematology Hgb 14.2 plt 196   Anesthesia Other Findings   Reproductive/Obstetrics negative OB ROS                            Anesthesia Physical Anesthesia Plan  ASA: III  Anesthesia Plan: General   Post-op Pain Management:    Induction: Intravenous  PONV Risk Score and Plan: 3 and Ondansetron, Dexamethasone and Treatment may vary due to age or medical condition  Airway Management Planned: Oral ETT  Additional Equipment: None  Intra-op Plan:   Post-operative Plan: Extubation in OR  Informed Consent: I have reviewed the patients History and Physical, chart, labs and discussed the procedure including the risks, benefits and alternatives for the proposed anesthesia with the patient or authorized representative who has indicated his/her understanding and acceptance.     Dental advisory given  Plan Discussed with: CRNA  Anesthesia Plan Comments:        Anesthesia Quick Evaluation

## 2019-08-19 ENCOUNTER — Other Ambulatory Visit: Payer: Self-pay

## 2019-08-19 ENCOUNTER — Observation Stay (HOSPITAL_COMMUNITY)
Admission: RE | Admit: 2019-08-19 | Discharge: 2019-08-20 | Disposition: A | Discharge status: Home / Self Care | Payer: Medicare Other | Source: Ambulatory Visit | Attending: Urology | Admitting: Urology

## 2019-08-19 ENCOUNTER — Encounter (HOSPITAL_COMMUNITY): Payer: Self-pay

## 2019-08-19 ENCOUNTER — Ambulatory Visit (HOSPITAL_COMMUNITY): Payer: Medicare Other | Admitting: Anesthesiology

## 2019-08-19 ENCOUNTER — Ambulatory Visit (HOSPITAL_COMMUNITY): Payer: Medicare Other | Admitting: Physician Assistant

## 2019-08-19 ENCOUNTER — Encounter (HOSPITAL_COMMUNITY)
Admission: RE | Disposition: A | Discharge status: Home / Self Care | Payer: Self-pay | Source: Ambulatory Visit | Attending: Urology

## 2019-08-19 DIAGNOSIS — E119 Type 2 diabetes mellitus without complications: Secondary | ICD-10-CM | POA: Insufficient documentation

## 2019-08-19 DIAGNOSIS — M1990 Unspecified osteoarthritis, unspecified site: Secondary | ICD-10-CM | POA: Insufficient documentation

## 2019-08-19 DIAGNOSIS — K219 Gastro-esophageal reflux disease without esophagitis: Secondary | ICD-10-CM | POA: Insufficient documentation

## 2019-08-19 DIAGNOSIS — E785 Hyperlipidemia, unspecified: Secondary | ICD-10-CM | POA: Insufficient documentation

## 2019-08-19 DIAGNOSIS — G4730 Sleep apnea, unspecified: Secondary | ICD-10-CM | POA: Diagnosis not present

## 2019-08-19 DIAGNOSIS — Z8673 Personal history of transient ischemic attack (TIA), and cerebral infarction without residual deficits: Secondary | ICD-10-CM | POA: Insufficient documentation

## 2019-08-19 DIAGNOSIS — I10 Essential (primary) hypertension: Secondary | ICD-10-CM | POA: Diagnosis not present

## 2019-08-19 DIAGNOSIS — C61 Malignant neoplasm of prostate: Principal | ICD-10-CM | POA: Insufficient documentation

## 2019-08-19 HISTORY — PX: ROBOT ASSISTED LAPAROSCOPIC RADICAL PROSTATECTOMY: SHX5141

## 2019-08-19 HISTORY — PX: LYMPHADENECTOMY: SHX5960

## 2019-08-19 LAB — TYPE AND SCREEN
ABO/RH(D): A POS
Antibody Screen: NEGATIVE

## 2019-08-19 LAB — GLUCOSE, CAPILLARY
Glucose-Capillary: 102 mg/dL — ABNORMAL HIGH (ref 70–99)
Glucose-Capillary: 162 mg/dL — ABNORMAL HIGH (ref 70–99)

## 2019-08-19 LAB — HEMOGLOBIN AND HEMATOCRIT, BLOOD
HCT: 37.6 % — ABNORMAL LOW (ref 39.0–52.0)
Hemoglobin: 12.7 g/dL — ABNORMAL LOW (ref 13.0–17.0)

## 2019-08-19 SURGERY — XI ROBOTIC ASSISTED LAPAROSCOPIC RADICAL PROSTATECTOMY LEVEL 3
Anesthesia: General

## 2019-08-19 MED ORDER — LOSARTAN POTASSIUM-HCTZ 50-12.5 MG PO TABS: 0.5000 | ORAL_TABLET | Freq: Every day | ORAL | Status: DC

## 2019-08-19 MED ORDER — GABAPENTIN 300 MG PO CAPS
600.0000 mg | ORAL_CAPSULE | Freq: Four times a day (QID) | ORAL | Status: DC
  Administered 2019-08-19 – 2019-08-20 (×3): 600 mg via ORAL
  Filled 2019-08-19 (×3): qty 2

## 2019-08-19 MED ORDER — FENTANYL CITRATE (PF) 250 MCG/5ML IJ SOLN
INTRAMUSCULAR | Status: DC | PRN
  Administered 2019-08-19 (×5): 50 ug via INTRAVENOUS

## 2019-08-19 MED ORDER — INDIGOTINDISULFONATE SODIUM 8 MG/ML IJ SOLN
INTRAMUSCULAR | Status: AC
  Filled 2019-08-19: qty 5

## 2019-08-19 MED ORDER — SUCCINYLCHOLINE CHLORIDE 200 MG/10ML IV SOSY
PREFILLED_SYRINGE | INTRAVENOUS | Status: AC
  Filled 2019-08-19: qty 10

## 2019-08-19 MED ORDER — ONDANSETRON HCL 4 MG/2ML IJ SOLN: 4.0000 mg | INTRAMUSCULAR | Status: DC | PRN

## 2019-08-19 MED ORDER — DIPHENHYDRAMINE HCL 50 MG/ML IJ SOLN: 12.5000 mg | Freq: Four times a day (QID) | INTRAMUSCULAR | Status: DC | PRN

## 2019-08-19 MED ORDER — FLEET ENEMA 7-19 GM/118ML RE ENEM
1.0000 | ENEMA | Freq: Once | RECTAL | Status: DC
  Filled 2019-08-19: qty 1

## 2019-08-19 MED ORDER — KETOROLAC TROMETHAMINE 15 MG/ML IJ SOLN
15.0000 mg | Freq: Four times a day (QID) | INTRAMUSCULAR | Status: DC
  Administered 2019-08-19 (×2): 15 mg via INTRAVENOUS
  Filled 2019-08-19 (×2): qty 1

## 2019-08-19 MED ORDER — BELLADONNA ALKALOIDS-OPIUM 16.2-60 MG RE SUPP: 1.0000 | Freq: Four times a day (QID) | RECTAL | Status: DC | PRN

## 2019-08-19 MED ORDER — SUGAMMADEX SODIUM 500 MG/5ML IV SOLN
INTRAVENOUS | Status: AC
  Filled 2019-08-19: qty 5

## 2019-08-19 MED ORDER — DOCUSATE SODIUM 100 MG PO CAPS
100.0000 mg | ORAL_CAPSULE | Freq: Two times a day (BID) | ORAL | Status: DC
  Administered 2019-08-19 – 2019-08-20 (×2): 100 mg via ORAL
  Filled 2019-08-19 (×2): qty 1

## 2019-08-19 MED ORDER — FENTANYL CITRATE (PF) 100 MCG/2ML IJ SOLN
INTRAMUSCULAR | Status: AC
  Administered 2019-08-19: 50 ug via INTRAVENOUS
  Filled 2019-08-19: qty 2

## 2019-08-19 MED ORDER — BUPIVACAINE HCL 0.25 % IJ SOLN
INTRAMUSCULAR | Status: DC | PRN
  Administered 2019-08-19: 30 mL

## 2019-08-19 MED ORDER — CHLORHEXIDINE GLUCONATE CLOTH 2 % EX PADS
6.0000 | MEDICATED_PAD | Freq: Every day | CUTANEOUS | Status: DC
  Administered 2019-08-20: 6 via TOPICAL

## 2019-08-19 MED ORDER — LIDOCAINE 2% (20 MG/ML) 5 ML SYRINGE
INTRAMUSCULAR | Status: DC | PRN
  Administered 2019-08-19: 60 mg via INTRAVENOUS

## 2019-08-19 MED ORDER — ONDANSETRON HCL 4 MG/2ML IJ SOLN: 4.0000 mg | Freq: Once | INTRAMUSCULAR | Status: DC | PRN

## 2019-08-19 MED ORDER — PROPOFOL 10 MG/ML IV BOLUS
INTRAVENOUS | Status: AC
  Filled 2019-08-19: qty 20

## 2019-08-19 MED ORDER — VANCOMYCIN HCL IN DEXTROSE 1-5 GM/200ML-% IV SOLN: 1000.0000 mg | Freq: Once | INTRAVENOUS | Status: DC

## 2019-08-19 MED ORDER — HEPARIN SODIUM (PORCINE) 1000 UNIT/ML IJ SOLN
INTRAMUSCULAR | Status: AC
  Filled 2019-08-19: qty 1

## 2019-08-19 MED ORDER — FENTANYL CITRATE (PF) 100 MCG/2ML IJ SOLN
25.0000 ug | INTRAMUSCULAR | Status: DC | PRN
  Administered 2019-08-19 (×4): 50 ug via INTRAVENOUS

## 2019-08-19 MED ORDER — VANCOMYCIN HCL 10 G IV SOLR
1500.0000 mg | INTRAVENOUS | Status: DC
  Administered 2019-08-19: 1500 mg via INTRAVENOUS
  Filled 2019-08-19: qty 1500

## 2019-08-19 MED ORDER — VANCOMYCIN HCL 10 G IV SOLR
1500.0000 mg | Freq: Once | INTRAVENOUS | Status: AC
  Administered 2019-08-19: 1500 mg via INTRAVENOUS
  Filled 2019-08-19: qty 1500

## 2019-08-19 MED ORDER — FENTANYL CITRATE (PF) 250 MCG/5ML IJ SOLN
INTRAMUSCULAR | Status: AC
  Filled 2019-08-19: qty 5

## 2019-08-19 MED ORDER — LACTATED RINGERS IV SOLN
INTRAVENOUS | Status: DC | PRN
  Administered 2019-08-19: 08:00:00

## 2019-08-19 MED ORDER — MORPHINE SULFATE (PF) 4 MG/ML IV SOLN: 2.0000 mg | INTRAVENOUS | Status: DC | PRN

## 2019-08-19 MED ORDER — SODIUM CHLORIDE 0.9 % IV SOLN
INTRAVENOUS | Status: DC | PRN
  Administered 2019-08-19: 40 ug/min via INTRAVENOUS

## 2019-08-19 MED ORDER — SUCCINYLCHOLINE CHLORIDE 200 MG/10ML IV SOSY
PREFILLED_SYRINGE | INTRAVENOUS | Status: DC | PRN
  Administered 2019-08-19: 110 mg via INTRAVENOUS

## 2019-08-19 MED ORDER — PHENYLEPHRINE 40 MCG/ML (10ML) SYRINGE FOR IV PUSH (FOR BLOOD PRESSURE SUPPORT)
PREFILLED_SYRINGE | INTRAVENOUS | Status: DC | PRN
  Administered 2019-08-19: 120 ug via INTRAVENOUS

## 2019-08-19 MED ORDER — BUPIVACAINE HCL (PF) 0.25 % IJ SOLN
INTRAMUSCULAR | Status: AC
  Filled 2019-08-19: qty 30

## 2019-08-19 MED ORDER — INDIGOTINDISULFONATE SODIUM 8 MG/ML IJ SOLN
INTRAMUSCULAR | Status: DC | PRN
  Administered 2019-08-19: 5 mL via INTRAVENOUS

## 2019-08-19 MED ORDER — SUGAMMADEX SODIUM 500 MG/5ML IV SOLN
INTRAVENOUS | Status: DC | PRN
  Administered 2019-08-19: 250 mg via INTRAVENOUS

## 2019-08-19 MED ORDER — DEXAMETHASONE SODIUM PHOSPHATE 10 MG/ML IJ SOLN
INTRAMUSCULAR | Status: DC | PRN
  Administered 2019-08-19: 8 mg via INTRAVENOUS

## 2019-08-19 MED ORDER — MAGNESIUM CITRATE PO SOLN: 1.0000 | Freq: Once | ORAL | Status: DC

## 2019-08-19 MED ORDER — PROPOFOL 10 MG/ML IV BOLUS
INTRAVENOUS | Status: DC | PRN
  Administered 2019-08-19: 160 mg via INTRAVENOUS

## 2019-08-19 MED ORDER — ONDANSETRON HCL 4 MG/2ML IJ SOLN
INTRAMUSCULAR | Status: AC
  Filled 2019-08-19: qty 2

## 2019-08-19 MED ORDER — SODIUM CHLORIDE 0.9 % IV BOLUS
1000.0000 mL | Freq: Once | INTRAVENOUS | Status: AC
  Administered 2019-08-19: 1000 mL via INTRAVENOUS

## 2019-08-19 MED ORDER — OXYCODONE HCL 5 MG/5ML PO SOLN: 5.0000 mg | Freq: Once | ORAL | Status: DC | PRN

## 2019-08-19 MED ORDER — LACTATED RINGERS IV SOLN
INTRAVENOUS | Status: DC
  Administered 2019-08-19 (×2): via INTRAVENOUS

## 2019-08-19 MED ORDER — VANCOMYCIN HCL IN DEXTROSE 1-5 GM/200ML-% IV SOLN: 1000.0000 mg | Freq: Two times a day (BID) | INTRAVENOUS | Status: DC

## 2019-08-19 MED ORDER — ROCURONIUM BROMIDE 10 MG/ML (PF) SYRINGE
PREFILLED_SYRINGE | INTRAVENOUS | Status: DC | PRN
  Administered 2019-08-19: 50 mg via INTRAVENOUS
  Administered 2019-08-19 (×2): 10 mg via INTRAVENOUS
  Administered 2019-08-19 (×2): 20 mg via INTRAVENOUS
  Administered 2019-08-19: 10 mg via INTRAVENOUS

## 2019-08-19 MED ORDER — ROCURONIUM BROMIDE 10 MG/ML (PF) SYRINGE
PREFILLED_SYRINGE | INTRAVENOUS | Status: AC
  Filled 2019-08-19: qty 10

## 2019-08-19 MED ORDER — LOSARTAN POTASSIUM 25 MG PO TABS
25.0000 mg | ORAL_TABLET | Freq: Every day | ORAL | Status: DC
  Administered 2019-08-19 – 2019-08-20 (×2): 25 mg via ORAL
  Filled 2019-08-19 (×2): qty 1

## 2019-08-19 MED ORDER — EPHEDRINE SULFATE-NACL 50-0.9 MG/10ML-% IV SOSY
PREFILLED_SYRINGE | INTRAVENOUS | Status: DC | PRN
  Administered 2019-08-19: 10 mg via INTRAVENOUS
  Administered 2019-08-19 (×2): 5 mg via INTRAVENOUS

## 2019-08-19 MED ORDER — MIDAZOLAM HCL 2 MG/2ML IJ SOLN
INTRAMUSCULAR | Status: DC | PRN
  Administered 2019-08-19: 2 mg via INTRAVENOUS

## 2019-08-19 MED ORDER — DEXAMETHASONE SODIUM PHOSPHATE 10 MG/ML IJ SOLN
INTRAMUSCULAR | Status: AC
  Filled 2019-08-19: qty 1

## 2019-08-19 MED ORDER — EPHEDRINE 5 MG/ML INJ
INTRAVENOUS | Status: AC
  Filled 2019-08-19: qty 10

## 2019-08-19 MED ORDER — ACETAMINOPHEN 325 MG PO TABS: 650.0000 mg | ORAL_TABLET | ORAL | Status: DC | PRN

## 2019-08-19 MED ORDER — MIDAZOLAM HCL 2 MG/2ML IJ SOLN
INTRAMUSCULAR | Status: AC
  Filled 2019-08-19: qty 2

## 2019-08-19 MED ORDER — BACITRACIN-NEOMYCIN-POLYMYXIN 400-5-5000 EX OINT: 1.0000 "application " | TOPICAL_OINTMENT | Freq: Three times a day (TID) | CUTANEOUS | Status: DC | PRN

## 2019-08-19 MED ORDER — CEFAZOLIN SODIUM-DEXTROSE 1-4 GM/50ML-% IV SOLN: 1.0000 g | Freq: Three times a day (TID) | INTRAVENOUS | Status: DC

## 2019-08-19 MED ORDER — SULFAMETHOXAZOLE-TRIMETHOPRIM 800-160 MG PO TABS: 1.0000 | ORAL_TABLET | Freq: Two times a day (BID) | ORAL | 0 refills | Status: DC

## 2019-08-19 MED ORDER — ATORVASTATIN CALCIUM 10 MG PO TABS: 10.0000 mg | ORAL_TABLET | ORAL | Status: DC

## 2019-08-19 MED ORDER — ONDANSETRON HCL 4 MG/2ML IJ SOLN
INTRAMUSCULAR | Status: DC | PRN
  Administered 2019-08-19: 4 mg via INTRAVENOUS

## 2019-08-19 MED ORDER — CEFAZOLIN SODIUM-DEXTROSE 2-4 GM/100ML-% IV SOLN
2.0000 g | Freq: Once | INTRAVENOUS | Status: AC
  Administered 2019-08-19: 2 g via INTRAVENOUS
  Filled 2019-08-19: qty 100

## 2019-08-19 MED ORDER — KETOROLAC TROMETHAMINE 30 MG/ML IJ SOLN
INTRAMUSCULAR | Status: AC
  Filled 2019-08-19: qty 1

## 2019-08-19 MED ORDER — HYDROCODONE-ACETAMINOPHEN 5-325 MG PO TABS: 1.0000 | ORAL_TABLET | Freq: Four times a day (QID) | ORAL | 0 refills | Status: DC | PRN

## 2019-08-19 MED ORDER — LIDOCAINE 2% (20 MG/ML) 5 ML SYRINGE
INTRAMUSCULAR | Status: AC
  Filled 2019-08-19: qty 5

## 2019-08-19 MED ORDER — KETOROLAC TROMETHAMINE 30 MG/ML IJ SOLN
30.0000 mg | Freq: Once | INTRAMUSCULAR | Status: AC | PRN
  Administered 2019-08-19: 30 mg via INTRAVENOUS

## 2019-08-19 MED ORDER — DIPHENHYDRAMINE HCL 12.5 MG/5ML PO ELIX: 12.5000 mg | ORAL_SOLUTION | Freq: Four times a day (QID) | ORAL | Status: DC | PRN

## 2019-08-19 MED ORDER — SODIUM CHLORIDE 0.9 % IR SOLN
Status: DC | PRN
  Administered 2019-08-19: 1000 mL

## 2019-08-19 MED ORDER — KCL IN DEXTROSE-NACL 20-5-0.45 MEQ/L-%-% IV SOLN
INTRAVENOUS | Status: DC
  Administered 2019-08-19 (×2): via INTRAVENOUS
  Filled 2019-08-19 (×4): qty 1000

## 2019-08-19 MED ORDER — HYDROCHLOROTHIAZIDE 10 MG/ML ORAL SUSPENSION
6.2500 mg | Freq: Every day | ORAL | Status: DC
  Administered 2019-08-19 – 2019-08-20 (×2): 6.25 mg via ORAL
  Filled 2019-08-19 (×2): qty 1.25

## 2019-08-19 MED ORDER — ZOLPIDEM TARTRATE 5 MG PO TABS: 5.0000 mg | ORAL_TABLET | Freq: Every evening | ORAL | Status: DC | PRN

## 2019-08-19 MED ORDER — PHENYLEPHRINE 40 MCG/ML (10ML) SYRINGE FOR IV PUSH (FOR BLOOD PRESSURE SUPPORT)
PREFILLED_SYRINGE | INTRAVENOUS | Status: AC
  Filled 2019-08-19: qty 10

## 2019-08-19 MED ORDER — MEPERIDINE HCL 50 MG/ML IJ SOLN: 6.2500 mg | INTRAMUSCULAR | Status: DC | PRN

## 2019-08-19 MED ORDER — FAMOTIDINE 20 MG PO TABS
20.0000 mg | ORAL_TABLET | Freq: Every day | ORAL | Status: DC
  Administered 2019-08-19: 20 mg via ORAL
  Filled 2019-08-19: qty 1

## 2019-08-19 MED ORDER — OXYCODONE HCL 5 MG PO TABS: 5.0000 mg | ORAL_TABLET | Freq: Once | ORAL | Status: DC | PRN

## 2019-08-19 MED ORDER — PHENYLEPHRINE HCL (PRESSORS) 10 MG/ML IV SOLN
INTRAVENOUS | Status: AC
  Filled 2019-08-19: qty 1

## 2019-08-19 SURGICAL SUPPLY — 62 items
ADH SKN CLS APL DERMABOND .7 (GAUZE/BANDAGES/DRESSINGS) ×2
APL PRP STRL LF DISP 70% ISPRP (MISCELLANEOUS) ×2
APL SWBSTK 6 STRL LF DISP (MISCELLANEOUS) ×2
APPLICATOR COTTON TIP 6 STRL (MISCELLANEOUS) ×2 IMPLANT
APPLICATOR COTTON TIP 6IN STRL (MISCELLANEOUS) ×4
CATH FOLEY 2WAY SLVR 18FR 30CC (CATHETERS) ×4 IMPLANT
CATH ROBINSON RED A/P 16FR (CATHETERS) ×4 IMPLANT
CATH ROBINSON RED A/P 8FR (CATHETERS) ×4 IMPLANT
CATH TIEMANN FOLEY 18FR 5CC (CATHETERS) ×4 IMPLANT
CHLORAPREP W/TINT 26 (MISCELLANEOUS) ×4 IMPLANT
CLIP VESOLOCK LG 6/CT PURPLE (CLIP) ×8 IMPLANT
COVER SURGICAL LIGHT HANDLE (MISCELLANEOUS) ×4 IMPLANT
COVER TIP SHEARS 8 DVNC (MISCELLANEOUS) ×2 IMPLANT
COVER TIP SHEARS 8MM DA VINCI (MISCELLANEOUS) ×2
COVER WAND RF STERILE (DRAPES) IMPLANT
CUTTER ECHEON FLEX ENDO 45 340 (ENDOMECHANICALS) ×4 IMPLANT
DECANTER SPIKE VIAL GLASS SM (MISCELLANEOUS) ×4 IMPLANT
DERMABOND ADVANCED (GAUZE/BANDAGES/DRESSINGS) ×2
DERMABOND ADVANCED .7 DNX12 (GAUZE/BANDAGES/DRESSINGS) ×2 IMPLANT
DRAIN CHANNEL RND F F (WOUND CARE) ×2 IMPLANT
DRAPE ARM DVNC X/XI (DISPOSABLE) ×8 IMPLANT
DRAPE COLUMN DVNC XI (DISPOSABLE) ×2 IMPLANT
DRAPE DA VINCI XI ARM (DISPOSABLE) ×8
DRAPE DA VINCI XI COLUMN (DISPOSABLE) ×2
DRAPE SURG IRRIG POUCH 19X23 (DRAPES) ×4 IMPLANT
DRSG TEGADERM 4X4.75 (GAUZE/BANDAGES/DRESSINGS) ×4 IMPLANT
ELECT PENCIL ROCKER SW 15FT (MISCELLANEOUS) ×4 IMPLANT
ELECT REM PT RETURN 15FT ADLT (MISCELLANEOUS) ×4 IMPLANT
GLOVE BIO SURGEON STRL SZ 6.5 (GLOVE) ×3 IMPLANT
GLOVE BIO SURGEONS STRL SZ 6.5 (GLOVE) ×1
GLOVE BIOGEL M STRL SZ7.5 (GLOVE) ×8 IMPLANT
GOWN STRL REUS W/TWL LRG LVL3 (GOWN DISPOSABLE) ×12 IMPLANT
HOLDER FOLEY CATH W/STRAP (MISCELLANEOUS) ×4 IMPLANT
IRRIG SUCT STRYKERFLOW 2 WTIP (MISCELLANEOUS) ×4
IRRIGATION SUCT STRKRFLW 2 WTP (MISCELLANEOUS) ×2 IMPLANT
IV LACTATED RINGERS 1000ML (IV SOLUTION) ×4 IMPLANT
KIT TURNOVER KIT A (KITS) IMPLANT
NDL SAFETY ECLIPSE 18X1.5 (NEEDLE) ×2 IMPLANT
NEEDLE HYPO 18GX1.5 SHARP (NEEDLE) ×4
PACK ROBOT UROLOGY CUSTOM (CUSTOM PROCEDURE TRAY) ×4 IMPLANT
RELOAD STAPLE 45 4.1 GRN THCK (STAPLE) ×2 IMPLANT
SEAL CANN UNIV 5-8 DVNC XI (MISCELLANEOUS) ×8 IMPLANT
SEAL XI 5MM-8MM UNIVERSAL (MISCELLANEOUS) ×8
SET TUBE SMOKE EVAC HIGH FLOW (TUBING) ×4 IMPLANT
SOLUTION ELECTROLUBE (MISCELLANEOUS) ×4 IMPLANT
STAPLE RELOAD 45 GRN (STAPLE) ×2 IMPLANT
STAPLE RELOAD 45MM GREEN (STAPLE) ×4
SUT ETHILON 3 0 PS 1 (SUTURE) ×4 IMPLANT
SUT MNCRL 3 0 RB1 (SUTURE) ×2 IMPLANT
SUT MNCRL 3 0 VIOLET RB1 (SUTURE) ×2 IMPLANT
SUT MNCRL AB 4-0 PS2 18 (SUTURE) ×8 IMPLANT
SUT MONOCRYL 3 0 RB1 (SUTURE) ×4
SUT VIC AB 0 CT1 27 (SUTURE) ×4
SUT VIC AB 0 CT1 27XBRD ANTBC (SUTURE) ×2 IMPLANT
SUT VIC AB 0 UR5 27 (SUTURE) ×4 IMPLANT
SUT VIC AB 2-0 SH 27 (SUTURE)
SUT VIC AB 2-0 SH 27X BRD (SUTURE) ×2 IMPLANT
SUT VICRYL 0 UR6 27IN ABS (SUTURE) ×8 IMPLANT
SYR 27GX1/2 1ML LL SAFETY (SYRINGE) ×4 IMPLANT
TOWEL OR NON WOVEN STRL DISP B (DISPOSABLE) ×4 IMPLANT
TROCAR XCEL NON-BLD 5MMX100MML (ENDOMECHANICALS) ×2 IMPLANT
WATER STERILE IRR 1000ML POUR (IV SOLUTION) ×4 IMPLANT

## 2019-08-19 NOTE — Op Note (Signed)
Preoperative diagnosis: Clinically localized adenocarcinoma of the prostate (clinical stage T1c Nx Mx)  Postoperative diagnosis: Clinically localized adenocarcinoma of the prostate (clinical stage T1c Nx Mx)  Procedure:  1. Robotic assisted laparoscopic radical prostatectomy (bilateral nerve sparing) 2. Bilateral robotic assisted laparoscopic pelvic lymphadenectomy  Surgeon: Pryor Curia. M.D.  Assistant: Debbrah Alar, PA-C  An assistant was required for this surgical procedure.  The duties of the assistant included but were not limited to suctioning, passing suture, camera manipulation, retraction. This procedure would not be able to be performed without an Environmental consultant.  Resident: Dr. Kerrie Pleasure  Anesthesia: General  Complications: None  EBL: 500 mL  IVF:  1500 mL crystalloid  Specimens: 1. Prostate and seminal vesicles 2. Right pelvic lymph nodes 3. Left pelvic lymph nodes  Disposition of specimens: Pathology  Drains: 1. 20 Fr coude catheter 2. # 19 Blake pelvic drain  Indication: Marvin Rivas is a 68 y.o. year old patient with clinically localized prostate cancer.  After a thorough review of the management options for treatment of prostate cancer, he elected to proceed with surgical therapy and the above procedure(s).  We have discussed the potential benefits and risks of the procedure, side effects of the proposed treatment, the likelihood of the patient achieving the goals of the procedure, and any potential problems that might occur during the procedure or recuperation. Informed consent has been obtained.  Description of procedure:  The patient was taken to the operating room and a general anesthetic was administered. He was given preoperative antibiotics, placed in the dorsal lithotomy position, and prepped and draped in the usual sterile fashion. Next a preoperative timeout was performed. A urethral catheter was placed into the bladder and a site was  selected near the umbilicus for placement of the camera port. This was placed using a standard open Hassan technique which allowed entry into the peritoneal cavity under direct vision and without difficulty. An 8 mm robotic port was placed and a pneumoperitoneum established. The camera was then used to inspect the abdomen and there was no evidence of any intra-abdominal injuries or other abnormalities. The remaining abdominal ports were then placed. 8 mm robotic ports were placed in the right lower quadrant, left lower quadrant, and far left lateral abdominal wall. A 5 mm port was placed in the right upper quadrant and a 12 mm port was placed in the right lateral abdominal wall for laparoscopic assistance. All ports were placed under direct vision without difficulty. The surgical cart was then docked.   Utilizing the cautery scissors, the bladder was reflected posteriorly allowing entry into the space of Retzius and identification of the endopelvic fascia and prostate. The periprostatic fat was then removed from the prostate allowing full exposure of the endopelvic fascia. The endopelvic fascia was then incised from the apex back to the base of the prostate bilaterally and the underlying levator muscle fibers were swept laterally off the prostate thereby isolating the dorsal venous complex. The dorsal vein was then stapled and divided with a 45 mm Flex Echelon stapler. Attention then turned to the bladder neck which was divided anteriorly thereby allowing entry into the bladder and exposure of the urethral catheter. The catheter balloon was deflated and the catheter was brought into the operative field and used to retract the prostate anteriorly. The posterior bladder neck was then examined.  There was a large intravesical median lobe.  Indigo carmine was administered and the ureteral orifices were identified and easily avoided.  The posterior  bladder neck was divided allowing further dissection between the  bladder and prostate posteriorly until the vasa deferentia and seminal vessels were identified. The vasa deferentia were isolated, divided, and lifted anteriorly. The seminal vesicles were dissected down to their tips with care to control the seminal vascular arterial blood supply. These structures were then lifted anteriorly and the space between Denonvillier's fascia and the anterior rectum was developed with a combination of sharp and blunt dissection. This isolated the vascular pedicles of the prostate.  The lateral prostatic fascia was then sharply incised allowing release of the neurovascular bundles bilaterally. The vascular pedicles of the prostate were then ligated with Weck clips between the prostate and neurovascular bundles and divided with sharp cold scissor dissection resulting in neurovascular bundle preservation. The neurovascular bundles were then separated off the apex of the prostate and urethra bilaterally.  The urethra was then sharply transected allowing the prostate specimen to be disarticulated. The pelvis was copiously irrigated and hemostasis was ensured. There was no evidence for rectal injury.  Attention then turned to the right pelvic sidewall. The fibrofatty tissue between the external iliac vein, confluence of the iliac vessels, hypogastric artery, and Cooper's ligament was dissected free from the pelvic sidewall with care to preserve the obturator nerve. Weck clips were used for lymphostasis and hemostasis. An identical procedure was performed on the contralateral side and the lymphatic packets were removed for permanent pathologic analysis.  Attention then turned to the urethral anastomosis. A 2-0 Vicryl slip knot was placed between Denonvillier's fascia, the posterior bladder neck, and the posterior urethra to reapproximate these structures. A double-armed 3-0 Monocryl suture was then used to perform a 360 running tension-free anastomosis between the bladder neck and  urethra. A new urethral catheter was then placed into the bladder and irrigated. There were no blood clots within the bladder and the anastomosis appeared to be watertight. A #19 Blake drain was then brought through the left lateral 8 mm port site and positioned appropriately within the pelvis. It was secured to the skin with a nylon suture. The surgical cart was then undocked. The right lateral 12 mm port site was closed at the fascial level with a 0 Vicryl suture placed laparoscopically. All remaining ports were then removed under direct vision. The prostate specimen was removed intact within the Endopouch retrieval bag via the periumbilical camera port site. This fascial opening was closed with two running 0 Vicryl sutures. 0.25% Marcaine was then injected into all port sites and all incisions were reapproximated at the skin level with 4-0 Monocryl subcuticular sutures and Dermabond. The patient appeared to tolerate the procedure well and without complications. The patient was able to be extubated and transferred to the recovery unit in satisfactory condition.   Pryor Curia MD

## 2019-08-19 NOTE — Anesthesia Postprocedure Evaluation (Signed)
Anesthesia Post Note  Patient: Marvin Rivas  Procedure(s) Performed: XI ROBOTIC ASSISTED LAPAROSCOPIC RADICAL PROSTATECTOMY LEVEL 3 (N/A ) LYMPHADENECTOMY, PELVIC (Bilateral )     Patient location during evaluation: PACU Anesthesia Type: General Level of consciousness: awake and alert Pain management: pain level controlled Vital Signs Assessment: post-procedure vital signs reviewed and stable Respiratory status: spontaneous breathing, nonlabored ventilation, respiratory function stable and patient connected to nasal cannula oxygen Cardiovascular status: blood pressure returned to baseline and stable Postop Assessment: no apparent nausea or vomiting Anesthetic complications: no    Last Vitals:  Vitals:   08/19/19 1330 08/19/19 1400  BP: 130/69 120/60  Pulse: 70 70  Resp: (!) 8 (!) 0  Temp:    SpO2: 100% 99%    Last Pain:  Vitals:   08/19/19 1400  TempSrc:   PainSc: [name]

## 2019-08-19 NOTE — Progress Notes (Signed)
Pharmacy Antibiotic Note  Marvin Rivas is a 68 y.o. male with PMH of prostate cancer admitted on 08/19/2019. S/p robotic assisted laparoscopic radical prostatectomy and bilateral robotic assisted laparoscopic pelvic lymphadenectomy today. Per discussion with MD, pre-op urine culture was positive for Enterococcus (results not available in Epic). Pharmacy has been consulted for Vancomycin dosing post-op for surgical prophylaxis as well as for coverage of positive pre-op urine culture. Patient received dose of IV Vancomycin 1500mg  and Cefazolin 2g pre-op.   Plan: Vancomycin 1500mg  IV q24h to start 08/20/2019 at 0000 Vancomycin levels at steady state as indicated Monitor renal function, culture results as available, clinical course   Height: 5\' 9"  (175.3 cm) Weight: 252 lb 9 oz (114.6 kg) IBW/kg (Calculated) : 70.7  Temp (24hrs), Avg:97.8 F (36.6 C), Min:97.4 F (36.3 C), Max:98.5 F (36.9 C)  Recent Labs  Lab 08/13/19 0846  WBC 4.9  CREATININE 1.13    Estimated Creatinine Clearance: 76 mL/min (by C-G formula based on SCr of 1.13 mg/dL).    No Known Allergies  Antimicrobials this admission: 10/19 Cefazolin x 1 pre-op 10/19 Vancomycin >>   Microbiology results: None ordered  Thank you for allowing pharmacy to be a part of this patient's care.  Luiz Ochoa 08/19/2019 4:40 PM

## 2019-08-19 NOTE — Discharge Instructions (Signed)

## 2019-08-19 NOTE — Progress Notes (Signed)
Patient ID: Marvin Rivas, male   DOB: 18-Mar-1951, 68 y.o.   MRN: [id number]  Post-op note  Subjective: The patient is doing well.  No complaints.  Objective: Vital signs in last 24 hours: Temp:  [97.4 F (36.3 C)-98.5 F (36.9 C)] 97.9 F (36.6 C) (10/19 1552) Pulse Rate:  [70-94] 76 (10/19 1552) Resp:  [10-18] 14 (10/19 1552) BP: (116-150)/(53-88) 118/70 (10/19 1552) SpO2:  [97 %-100 %] 97 % (10/19 1552) Weight:  [114.6 kg] 114.6 kg (10/19 0533)  Intake/Output from previous day: No intake/output data recorded. Intake/Output this shift: Total I/O In: 3516 [I.V.:1916; IV Piggyback:1600] Out: 1695 [Urine:1100; Drains:95; Blood:500]  Physical Exam:  General: Alert and oriented. Abdomen: Soft, Nondistended. Incisions: Clean and dry. GU: Urine clear.  Lab Results: Recent Labs    08/19/19 1206  HGB 12.7*  HCT 37.6*    Assessment/Plan: POD#0   1) Continue to monitor, ambulate, IS   Pryor Curia. MD   LOS: 0 days   Marvin Rivas 08/19/2019, 6:14 PM

## 2019-08-19 NOTE — Interval H&P Note (Signed)
History and Physical Interval Note:  08/19/2019 6:55 AM  Marvin Rivas  has presented today for surgery, with the diagnosis of PROSTATE CANCER.  The various methods of treatment have been discussed with the patient and family. After consideration of risks, benefits and other options for treatment, the patient has consented to  Procedure(s): XI ROBOTIC ASSISTED LAPAROSCOPIC RADICAL PROSTATECTOMY LEVEL 3 (N/A) LYMPHADENECTOMY, PELVIC (Bilateral) as a surgical intervention.  The patient's history has been reviewed, patient examined, no change in status, stable for surgery.  I have reviewed the patient's chart and labs.  Questions were answered to the patient's satisfaction.     [employer]

## 2019-08-19 NOTE — Anesthesia Procedure Notes (Signed)
Procedure Name: Intubation Date/Time: 08/19/2019 7:26 AM Performed by: Niel Hummer, CRNA Pre-anesthesia Checklist: Patient identified, Emergency Drugs available, Suction available and Patient being monitored Patient Re-evaluated:Patient Re-evaluated prior to induction Oxygen Delivery Method: Circle system utilized Preoxygenation: Pre-oxygenation with 100% oxygen Induction Type: IV induction Laryngoscope Size: Mac and 4 Grade View: Grade I Tube type: Oral Tube size: 7.5 mm Number of attempts: 1 Airway Equipment and Method: Stylet Placement Confirmation: ETT inserted through vocal cords under direct vision,  positive ETCO2 and breath sounds checked- equal and bilateral Secured at: 22 cm Tube secured with: Tape Dental Injury: Teeth and Oropharynx as per pre-operative assessment

## 2019-08-19 NOTE — Transfer of Care (Signed)
Immediate Anesthesia Transfer of Care Note  Patient: Marvin Rivas  Procedure(s) Performed: XI ROBOTIC ASSISTED LAPAROSCOPIC RADICAL PROSTATECTOMY LEVEL 3 (N/A ) LYMPHADENECTOMY, PELVIC (Bilateral )  Patient Location: PACU  Anesthesia Type:General  Level of Consciousness: awake, alert , oriented and patient cooperative  Airway & Oxygen Therapy: Patient Spontanous Breathing and Patient connected to face mask oxygen  Post-op Assessment: Report given to RN, Post -op Vital signs reviewed and stable and Patient moving all extremities  Post vital signs: Reviewed and stable  Last Vitals:  Vitals Value Taken Time  BP 131/53 08/19/19 1124  Temp 36.3 C 08/19/19 1124  Pulse 94 08/19/19 1124  Resp 12 08/19/19 1124  SpO2 100 % 08/19/19 1124    Last Pain:  Vitals:   08/19/19 0633  TempSrc: Oral  PainSc:          Complications: No apparent anesthesia complications

## 2019-08-20 ENCOUNTER — Encounter (HOSPITAL_COMMUNITY): Payer: Self-pay | Admitting: Urology

## 2019-08-20 DIAGNOSIS — C61 Malignant neoplasm of prostate: Secondary | ICD-10-CM | POA: Diagnosis not present

## 2019-08-20 LAB — CREATININE, SERUM
Creatinine, Ser: 0.95 mg/dL (ref 0.61–1.24)
GFR calc Af Amer: >60 mL/min (ref >60)
GFR calc non Af Amer: >60 mL/min (ref >60)

## 2019-08-20 LAB — HEMOGLOBIN AND HEMATOCRIT, BLOOD
HCT: 33.7 % — ABNORMAL LOW (ref 39.0–52.0)
Hemoglobin: 11.2 g/dL — ABNORMAL LOW (ref 13.0–17.0)

## 2019-08-20 MED ORDER — KETOROLAC TROMETHAMINE 30 MG/ML IJ SOLN
15.0000 mg | Freq: Four times a day (QID) | INTRAMUSCULAR | Status: DC
  Administered 2019-08-20: 15 mg via INTRAVENOUS
  Filled 2019-08-20: qty 1

## 2019-08-20 MED ORDER — TRAMADOL HCL 50 MG PO TABS
50.0000 mg | ORAL_TABLET | Freq: Four times a day (QID) | ORAL | Status: DC | PRN
  Administered 2019-08-20: 50 mg via ORAL
  Filled 2019-08-20: qty 1

## 2019-08-20 MED ORDER — LEVOFLOXACIN 500 MG PO TABS: 500.0000 mg | ORAL_TABLET | Freq: Every day | ORAL | 0 refills | Status: DC

## 2019-08-20 MED ORDER — BISACODYL 10 MG RE SUPP
10.0000 mg | Freq: Once | RECTAL | Status: AC
  Administered 2019-08-20: 10 mg via RECTAL
  Filled 2019-08-20: qty 1

## 2019-08-20 NOTE — Discharge Summary (Signed)
Date of admission: 08/19/2019  Date of discharge: 08/20/2019  Admission diagnosis: Prostate Cancer  Discharge diagnosis: Prostate Cancer  History and Physical: For full details, please see admission history and physical. Briefly, Marvin Rivas is a 68 y.o. gentleman with localized prostate cancer.  After discussing management/treatment options, he elected to proceed with surgical treatment.  Hospital Course: TREYTEN MONESTIME was taken to the operating room on 08/19/2019 and underwent a robotic assisted laparoscopic radical prostatectomy. He tolerated this procedure well and without complications. Postoperatively, he was able to be transferred to a regular hospital room following recovery from anesthesia.  He was able to begin ambulating the night of surgery. He remained hemodynamically stable overnight.  He had excellent urine output with appropriately minimal output from his pelvic drain and his pelvic drain was removed on POD #1.  He was transitioned to oral pain medication, tolerated a clear liquid diet, and had met all discharge criteria and was able to be discharged home later on POD#1.  Laboratory values:  Recent Labs    08/19/19 1206 08/20/19 0503  HGB 12.7* 11.2*  HCT 37.6* 33.7*    Disposition: Home  Discharge instruction: He was instructed to be ambulatory but to refrain from heavy lifting, strenuous activity, or driving. He was instructed on urethral catheter care.  Discharge medications:   Allergies as of 08/20/2019   No Known Allergies     Medication List    STOP taking these medications   aspirin EC 81 MG tablet   finasteride 5 MG tablet Commonly known as: PROSCAR   meloxicam 15 MG tablet Commonly known as: MOBIC   tamsulosin 0.4 MG Caps capsule Commonly known as: FLOMAX     TAKE these medications   atorvastatin 10 MG tablet Commonly known as: LIPITOR Take 10 mg by mouth once a week. Sunday morning   famotidine 20 MG tablet Commonly known as:  PEPCID Take 20 mg by mouth at bedtime.   gabapentin 600 MG tablet Commonly known as: NEURONTIN Take 1 tablet (600 mg total) by mouth 4 (four) times daily. What changed:   how much to take  when to take this  additional instructions   HYDROcodone-acetaminophen 5-325 MG tablet Commonly known as: NORCO/VICODIN Take 1-2 tablets by mouth every 6 (six) hours as needed for moderate pain or severe pain. What changed: reasons to take this   levofloxacin 500 MG tablet Commonly known as: Levaquin Take 1 tablet (500 mg total) by mouth daily.   losartan-hydrochlorothiazide 50-12.5 MG tablet Commonly known as: HYZAAR Take 0.5 tablets daily by mouth.   OVER THE COUNTER MEDICATION Place 2 sprays into both nostrils as needed (for nasal congestion). [address] Nasal Spray [employer] Brand   sulfamethoxazole-trimethoprim 800-160 MG tablet Commonly known as: BACTRIM DS Take 1 tablet by mouth 2 (two) times daily. Start the day prior to foley removal appointment       Followup: He will followup in 1 week for catheter removal and to discuss his surgical pathology results.

## 2019-08-20 NOTE — Progress Notes (Signed)
Patient ID: Marvin Rivas, male   DOB: 01-03-1951, 68 y.o.   MRN: [id number]  1 Day Post-Op Subjective: The patient is doing well.  No nausea or vomiting. Pain is adequately controlled.  Objective: Vital signs in last 24 hours: Temp:  [97.4 F (36.3 C)-99 F (37.2 C)] 98.7 F (37.1 C) (10/20 0546) Pulse Rate:  [69-105] 69 (10/20 0546) Resp:  [10-18] 18 (10/20 0546) BP: (96-150)/(53-88) 96/54 (10/20 0546) SpO2:  [96 %-100 %] 97 % (10/20 0546)  Intake/Output from previous day: 10/19 0701 - 10/20 0700 In: 4326 [P.O.:360; I.V.:2366; IV Piggyback:1600] Out: 3400 [Urine:2700; Drains:200; Blood:500] Intake/Output this shift: No intake/output data recorded.  Physical Exam:  General: Alert and oriented. CV: RRR Lungs: Clear bilaterally. GI: Soft, Nondistended. Incisions: Clean, dry, and intact Urine: Clear Extremities: Nontender, no erythema, no edema.  Lab Results: Recent Labs    08/19/19 1206 08/20/19 0503  HGB 12.7* 11.2*  HCT 37.6* 33.7*      Assessment/Plan: POD# 1 s/p robotic prostatectomy.  1) SL IVF 2) Ambulate, Incentive spirometry 3) Transition to oral pain medication 4) Dulcolax suppository 5) D/C pelvic drain 6) Plan for likely discharge later today if remains hemodynamically stable   Pryor Curia. MD   LOS: 0 days   Dutch Gray 08/20/2019, 7:38 AM

## 2019-08-20 NOTE — Progress Notes (Signed)
Went over all discharge papers with patient, all questions answered.  VSS.  Foley and leg bag teaching completed.

## 2019-08-23 LAB — SURGICAL PATHOLOGY

## 2019-12-14 ENCOUNTER — Ambulatory Visit: Payer: Medicare Other

## 2019-12-15 ENCOUNTER — Ambulatory Visit: Payer: Medicare Other

## 2019-12-15 ENCOUNTER — Ambulatory Visit: Payer: Medicare Other | Attending: Internal Medicine

## 2019-12-15 DIAGNOSIS — Z23 Encounter for immunization: Secondary | ICD-10-CM | POA: Insufficient documentation

## 2019-12-15 NOTE — Progress Notes (Signed)
   Covid-19 Vaccination Clinic  Name:  OZIAS HERMANNS    MRN: [id number] DOB: [birth year]  12/15/2019  Ms. Button was observed post Covid-19 immunization for 15 minutes without incidence. She was provided with Vaccine Information Sheet and instruction to access the V-Safe system.   Ms. Kindig was instructed to call 911 with any severe reactions post vaccine: Marland Kitchen Difficulty breathing  . Swelling of your face and throat  . A fast heartbeat  . A bad rash all over your body  . Dizziness and weakness    Immunizations Administered    Name Date Dose VIS Date Route   Pfizer COVID-19 Vaccine 12/15/2019  2:21 PM 0.3 mL 10/11/2019 Intramuscular   Manufacturer: [place]   Lot: [id number]   [place]: [id number]

## 2019-12-16 ENCOUNTER — Encounter: Payer: Self-pay | Admitting: Neurology

## 2019-12-16 ENCOUNTER — Ambulatory Visit: Payer: Medicare Other | Admitting: Neurology

## 2019-12-16 ENCOUNTER — Other Ambulatory Visit: Payer: Self-pay

## 2019-12-16 VITALS — BP 150/73 | HR 55 | Temp 97.7°F | Ht 69.0 in | Wt 256.0 lb

## 2019-12-16 DIAGNOSIS — R269 Unspecified abnormalities of gait and mobility: Secondary | ICD-10-CM

## 2019-12-16 DIAGNOSIS — E1142 Type 2 diabetes mellitus with diabetic polyneuropathy: Secondary | ICD-10-CM

## 2019-12-16 MED ORDER — DULOXETINE HCL 30 MG PO CPEP: ORAL_CAPSULE | ORAL | 3 refills | Status: DC

## 2019-12-16 MED ORDER — GABAPENTIN 600 MG PO TABS: 600.0000 mg | ORAL_TABLET | Freq: Four times a day (QID) | ORAL | 3 refills | Status: AC

## 2019-12-16 NOTE — Progress Notes (Signed)
Reason for visit: Diabetic peripheral neuropathy  Marvin Rivas is an 69 y.o. male  History of present illness:  [name] is a 69 year old right-handed white male with a history of diabetes in the past and a peripheral neuropathy.  The patient is on gabapentin at maximal doses taking 600 mg 4 times daily.  He has lost about 70 pounds of weight with a combination of renal and prostate cancer.  His appetite has now returned.  He is wondering whether he can come off CPAP because of the weight loss.  He has numbness up to the knees, he is able to operate a motor vehicle using hand controls as he cannot feel his feet to drive.  He has been doing this for about 7 years.  The patient reports that his hands are numb, he was told he had carpal tunnel syndrome but has not wanted to have surgery for this.  The patient has some chronic back pain, he has had 3 prior lumbosacral spine surgeries.  He will stumble on occasion, he generally will use a cane outside of the house but not in the house.  He has no steps or stairs to climb inside the house.  He returns to the office today for an evaluation.  He has a lot of pain in the feet if he goes barefoot, he is still having some trouble sleeping at night.  Past Medical History:  Diagnosis Date  . Abnormality of gait 01/26/2015  . Arthritis   . Cancer [facility])    right kidney cancer and prostate cancer half of right kidney removed  . Carpal tunnel syndrome on both sides    neuropathy in feet  . Diabetes mellitus without complication ([place])    controlled with diet  . Dyslipidemia   . GERD (gastroesophageal reflux disease)   . Glaucoma    pt states he had a 2nd opthalmologist opinion and was told he did not have glaucoma  . History of kidney stones   . Hypertension   . Morbid obesity ([place])   . Neuropathy   . Neuropathy    legs  . OSA on CPAP    uses CPAP  . Polyneuropathy in diabetes(357.2) 01/23/2014  . Renal neoplasm   . TIA (transient ischemic  attack)    ?2013    Past Surgical History:  Procedure Laterality Date  . ANTERIOR LAT LUMBAR FUSION N/A 07/11/2017   Procedure: L2-3 L3-4 Anteriolateral lumbar interbody fusion with posterior fixation and exploration/revision of previous fusion L4 to S1;  Surgeon: Erline Levine, MD;  Location: [place];  Service: Neurosurgery;  Laterality: N/A;  L2-3 L3-4 Anteriolateral lumbar interbody fusion with posterior fixation and exploration/revision of previous fusion L4 to S1  . BACK SURGERY    . COLONOSCOPY    . CYSTOSCOPY/URETEROSCOPY/HOLMIUM LASER/STENT PLACEMENT Left 06/20/2019   Procedure: CYSTOSCOPY/ RETROGRADE/URETEROSCOPY/HOLMIUM LASER/STENT PLACEMENT/ CYSTOLITHALOPAXY;  Surgeon: Raynelle Bring, MD;  Location: [facility];  Service: Urology;  Laterality: Left;  . HOLMIUM LASER APPLICATION Left [id number]   Procedure: HOLMIUM LASER APPLICATION;  Surgeon: Raynelle Bring, MD;  Location: [facility];  Service: Urology;  Laterality: Left;  . LUMBAR PERCUTANEOUS PEDICLE SCREW 2 LEVEL N/A 07/11/2017   Procedure: LUMBAR PERCUTANEOUS PEDICLE SCREW 2 LEVEL;  Surgeon: Erline Levine, MD;  Location: [place];  Service: Neurosurgery;  Laterality: N/A;  . LYMPHADENECTOMY Bilateral 08/19/2019   Procedure: LYMPHADENECTOMY, PELVIC;  Surgeon: Raynelle Bring, MD;  Location: [facility];  Service: Urology;  Laterality: Bilateral;  . NASAL SEPTUM SURGERY    .  ROBOT ASSISTED LAPAROSCOPIC RADICAL PROSTATECTOMY N/A 08/19/2019   Procedure: XI ROBOTIC ASSISTED LAPAROSCOPIC RADICAL PROSTATECTOMY LEVEL 3;  Surgeon: Raynelle Bring, MD;  Location: [facility];  Service: Urology;  Laterality: N/A;  . ROBOTIC ASSITED PARTIAL NEPHRECTOMY Right 09/14/2017   Procedure: ROBOTIC ASSITED PARTIAL NEPHRECTOMY;  Surgeon: Raynelle Bring, MD;  Location: [facility];  Service: Urology;  Laterality: Right;  NEEDS 210 MIN FOR PROCEDURE  . SPINAL FUSION    . toenail removed     both big toenails removed as a child    Family History  Problem Relation Age of Onset  .  Prostate cancer Father   . Hypertension Mother     Social history:  reports that he quit smoking about 20 years ago. He has never used smokeless tobacco. He reports that he does not drink alcohol or use drugs.   No Known Allergies  Medications:  Prior to Admission medications   Medication Sig Start Date End Date Taking? Authorizing Provider  atorvastatin (LIPITOR) 10 MG tablet Take 10 mg by mouth once a week. Sunday morning 06/11/19  Yes [provider]  famotidine (PEPCID) 20 MG tablet Take 20 mg by mouth at bedtime.    Yes [provider]  gabapentin (NEURONTIN) 600 MG tablet Take 1 tablet (600 mg total) by mouth 4 (four) times daily. Patient taking differently: Take 600-1,200 mg by mouth See admin instructions. Take 600 mg in the morning and supper Take 1200 mg at lunch 07/30/19  Yes Kathrynn Ducking, MD  losartan-hydrochlorothiazide (HYZAAR) 50-12.5 MG tablet Take 0.5 tablets daily by mouth.   Yes [provider]  OVER THE COUNTER MEDICATION Place 2 sprays into both nostrils as needed (for nasal congestion). [address] Nasal Spray [employer] Brand   Yes [provider]    ROS:  Out of a complete 14 system review of symptoms, the patient complains only of the following symptoms, and all other reviewed systems are negative.  Numbness, tingling Balance problems Urinary incontinence Weight loss  Blood pressure (!) 150/73, pulse (!) 55, temperature 97.7 F (36.5 C), height 5\' 9"  (1.753 m), weight 256 lb (116.1 kg).  Physical Exam  General: The patient is alert and cooperative at the time of the examination.  The patient is moderately obese.  Skin: No significant peripheral edema is noted.   Neurologic Exam  Mental status: The patient is alert and oriented x 3 at the time of the examination. The patient has apparent normal recent and remote memory, with an apparently normal attention span and concentration ability.   Cranial nerves: Facial  symmetry is present. Speech is normal, no aphasia or dysarthria is noted. Extraocular movements are full. Visual fields are full.  Motor: The patient has good strength in all 4 extremities.  There is no evidence of foot drop on either side.  Sensory examination: Soft touch sensation is symmetric on the face, arms, and legs.  Coordination: The patient has good finger-nose-finger and heel-to-shin bilaterally.  Gait and station: The patient has a slightly wide-based gait, he is able to walk with fairly good stability using a cane.  Tandem gait was not attempted. Romberg is negative. No drift is seen.  Reflexes: Deep tendon reflexes are symmetric, but are depressed.   Assessment/Plan:  1.  Diabetic peripheral neuropathy  2.  Gait disturbance  The patient is still having a lot of discomfort even on maximal doses of gabapentin.  We will add Cymbalta taking 30 mg at night for 2 weeks and then go  to 30 mg twice daily.  He was given a prescription for the gabapentin as well.  He will call for any dose adjustments of the Cymbalta.  He will otherwise follow-up here in 1 year.  Jill Alexanders MD 12/16/2019 7:17 AM  [facility] [address] [place], [place] [address]  Phone [phone] Fax [phone]

## 2020-01-06 ENCOUNTER — Other Ambulatory Visit: Payer: Self-pay

## 2020-01-06 ENCOUNTER — Ambulatory Visit (HOSPITAL_COMMUNITY)
Admission: RE | Admit: 2020-01-06 | Discharge: 2020-01-06 | Disposition: A | Discharge status: Home / Self Care | Payer: Medicare Other | Source: Ambulatory Visit | Attending: Surgery | Admitting: Surgery

## 2020-01-06 ENCOUNTER — Ambulatory Visit: Payer: Medicare Other | Admitting: Physician Assistant

## 2020-01-06 VITALS — BP 147/79 | HR 69 | Temp 96.6°F | Resp 18 | Ht 69.0 in | Wt 261.0 lb

## 2020-01-06 DIAGNOSIS — I739 Peripheral vascular disease, unspecified: Secondary | ICD-10-CM

## 2020-01-06 NOTE — Progress Notes (Signed)
History of Present Illness:  Patient is a 69 y.o. year old male who presents for medicare surveillance of PAD.  He denise symptoms of claudication, non healing wounds and no rest pain.  He has history of ingrown toe nails with nail removal x 6 that have healed without problems in the past.    Past medical history includes: Chronic lumbar pain s/p L2-L4 inter body fusion, prostate CA, Renal carcinoma, HTN and hypercholesterolemia managed with a daily Statin.   Past Medical History:  Diagnosis Date  . Abnormality of gait 01/26/2015  . Arthritis   . [facility])    right kidney cancer and prostate cancer half of right kidney removed  . Carpal tunnel syndrome on both sides    neuropathy in feet  . Diabetes mellitus without complication ([place])    controlled with diet  . Dyslipidemia   . GERD (gastroesophageal reflux disease)   . Glaucoma    pt states he had a 2nd opthalmologist opinion and was told he did not have glaucoma  . History of kidney stones   . Hypertension   . Morbid obesity ([place])   . Neuropathy   . Neuropathy    legs  . OSA on CPAP    uses CPAP  . Polyneuropathy in diabetes(357.2) 01/23/2014  . Renal neoplasm   . TIA (transient ischemic attack)    ?2013    Past Surgical History:  Procedure Laterality Date  . ANTERIOR LAT LUMBAR FUSION N/A 07/11/2017   Procedure: L2-3 L3-4 Anteriolateral lumbar interbody fusion with posterior fixation and exploration/revision of previous fusion L4 to S1;  Surgeon: Erline Levine, MD;  Location: [place];  Service: Neurosurgery;  Laterality: N/A;  L2-3 L3-4 Anteriolateral lumbar interbody fusion with posterior fixation and exploration/revision of previous fusion L4 to S1  . BACK SURGERY    . COLONOSCOPY    . CYSTOSCOPY/URETEROSCOPY/HOLMIUM LASER/STENT PLACEMENT Left 06/20/2019   Procedure: CYSTOSCOPY/ RETROGRADE/URETEROSCOPY/HOLMIUM LASER/STENT PLACEMENT/ CYSTOLITHALOPAXY;  Surgeon: Raynelle Bring, MD;  Location: [facility];  Service: Urology;   Laterality: Left;  . HOLMIUM LASER APPLICATION Left [id number]   Procedure: HOLMIUM LASER APPLICATION;  Surgeon: Raynelle Bring, MD;  Location: [facility];  Service: Urology;  Laterality: Left;  . LUMBAR PERCUTANEOUS PEDICLE SCREW 2 LEVEL N/A 07/11/2017   Procedure: LUMBAR PERCUTANEOUS PEDICLE SCREW 2 LEVEL;  Surgeon: Erline Levine, MD;  Location: [facility];  Service: Neurosurgery;  Laterality: N/A;  . LYMPHADENECTOMY Bilateral 08/19/2019   Procedure: LYMPHADENECTOMY, PELVIC;  Surgeon: Raynelle Bring, MD;  Location: [facility];  Service: Urology;  Laterality: Bilateral;  . NASAL SEPTUM SURGERY    . ROBOT ASSISTED LAPAROSCOPIC RADICAL PROSTATECTOMY N/A 08/19/2019   Procedure: XI ROBOTIC ASSISTED LAPAROSCOPIC RADICAL PROSTATECTOMY LEVEL 3;  Surgeon: Raynelle Bring, MD;  Location: [facility];  Service: Urology;  Laterality: N/A;  . ROBOTIC ASSITED PARTIAL NEPHRECTOMY Right 09/14/2017   Procedure: ROBOTIC ASSITED PARTIAL NEPHRECTOMY;  Surgeon: Raynelle Bring, MD;  Location: [facility];  Service: Urology;  Laterality: Right;  NEEDS 210 MIN FOR PROCEDURE  . SPINAL FUSION    . toenail removed     both big toenails removed as a child    ROS:   General:  No weight loss, Fever, chills  HEENT: No recent headaches, no nasal bleeding, no visual changes, no sore throat  Neurologic: No dizziness, blackouts, seizures. No recent symptoms of stroke or mini- stroke. No recent episodes of slurred speech, or temporary blindness.  Cardiac: No recent episodes of chest pain/pressure, no shortness of breath  at rest.  No shortness of breath with exertion.  Denies history of atrial fibrillation or irregular heartbeat  Vascular: No history of rest pain in feet.  No history of claudication.  No history of non-healing ulcer, No history of DVT   Pulmonary: No home oxygen, no productive cough, no hemoptysis,  No asthma or wheezing  Musculoskeletal:  [ ]  Arthritis, [ ]  Low back pain,  [x ] Joint pain  Hematologic:No history of  hypercoagulable state.  No history of easy bleeding.  No history of anemia  Gastrointestinal: No hematochezia or melena,  No gastroesophageal reflux, no trouble swallowing  Urinary: [ ]  chronic Kidney disease, [ ]  on HD - [ ]  MWF or [ ]  TTHS, [ ]  Burning with urination, [ ]  Frequent urination, [ ]  Difficulty urinating;   Skin: No rashes  Psychological: No history of anxiety,  No history of depression  Social History Social History   Tobacco Use  . Smoking status: Former Smoker    Quit date: 01/24/1999    Years since quitting: 20.9  . Smokeless tobacco: Never Used  Substance Use Topics  . Alcohol use: No  . Drug use: No    Family History Family History  Problem Relation Age of Onset  . Prostate cancer Father   . Hypertension Mother     Allergies  No Known Allergies   Current Outpatient Medications  Medication Sig Dispense Refill  . atorvastatin (LIPITOR) 10 MG tablet Take 10 mg by mouth once a week. Sunday morning    . DULoxetine (CYMBALTA) 30 MG capsule One capsule at night for 2 weeks, then take one twice a day 60 capsule 3  . famotidine (PEPCID) 20 MG tablet Take 20 mg by mouth at bedtime.     . gabapentin (NEURONTIN) 600 MG tablet Take 1 tablet (600 mg total) by mouth 4 (four) times daily. 360 tablet 3  . losartan-hydrochlorothiazide (HYZAAR) 50-12.5 MG tablet Take 0.5 tablets daily by mouth.    Marland Kitchen OVER THE COUNTER MEDICATION Place 2 sprays into both nostrils as needed (for nasal congestion). [address] Nasal Spray [employer] Brand     No current facility-administered medications for this visit.    Physical Examination  Vitals:   01/06/20 1051  BP: (!) 147/79  Pulse: 69  Resp: 18  Temp: (!) 96.6 F (35.9 C)  TempSrc: Temporal  SpO2: 98%  Weight: 261 lb (118.4 kg)  Height: 5\' 9"  (1.753 m)    Body mass index is 38.54 kg/m.  General:  Alert and oriented, no acute distress HEENT: Normal Neck: No bruit or JVD Pulmonary: Clear to auscultation  bilaterally Cardiac: Regular Rate and Rhythm without murmur Abdomen: Soft, non-tender, non-distended, no mass, no scars Skin: No rash Extremity Pulses:  2+ radial, brachial, femoral, dorsalis pedis, posterior tibial pulses bilaterally Musculoskeletal: No deformity or edema  Neurologic: Upper and lower extremity motor 5/5 and symmetric  DATA:    ABI Findings:  +---------+------------------+-----+---------+--------+  Right  Rt Pressure (mmHg)IndexWaveform Comment   +---------+------------------+-----+---------+--------+  Brachial 167                      +---------+------------------+-----+---------+--------+  ATA   213        1.25            +---------+------------------+-----+---------+--------+  PTA   218        1.27 triphasic      +---------+------------------+-----+---------+--------+  DP  triphasic      +---------+------------------+-----+---------+--------+  Great Toe172        1.01            +---------+------------------+-----+---------+--------+   +---------+------------------+-----+---------+-------+  Left   Lt Pressure (mmHg)IndexWaveform Comment  +---------+------------------+-----+---------+-------+  Brachial 171                      +---------+------------------+-----+---------+-------+  ATA   214        1.25            +---------+------------------+-----+---------+-------+  PTA   205        1.20 triphasic      +---------+------------------+-----+---------+-------+  DP                triphasic      +---------+------------------+-----+---------+-------+  Great Toe146        0.85            +---------+------------------+-----+---------+-------+   +-------+-----------+-----------+------------+------------+   ABI/TBIToday's ABIToday's TBIPrevious ABIPrevious TBI  +-------+-----------+-----------+------------+------------+  Right 1.27    1.01                  +-------+-----------+-----------+------------+------------+  Left  1.25    0.85                  +-------+-----------+-----------+------------+------------+       Summary:  Right: Resting right ankle-brachial index is within normal range. No  evidence of significant right lower extremity arterial disease. The right  toe-brachial index is normal. RT great toe pressure = 172 mmHg.   Left: Resting left ankle-brachial index is within normal range. No  evidence of significant left lower extremity arterial disease. The left  toe-brachial index is normal. LT Great toe pressure = 146 mmHg.   ASSESSMENT/Plan:  Asymptomatic PADHe has normal arterial triphasic blood flow to B LE.  ABI's are abnormally elevated with evidence of calcification B LE.  If he develops signs or symptoms of PAD in the future he will call.  F/U PRN.       Roxy Horseman PA-C [facility] Office: [phone]  MD in clinic [place]

## 2020-01-07 ENCOUNTER — Ambulatory Visit: Payer: Medicare Other | Attending: Internal Medicine

## 2020-01-07 ENCOUNTER — Other Ambulatory Visit: Payer: Self-pay | Admitting: Neurology

## 2020-01-07 DIAGNOSIS — Z23 Encounter for immunization: Secondary | ICD-10-CM

## 2020-01-07 NOTE — Progress Notes (Signed)
   Covid-19 Vaccination Clinic  Name:  Marvin Rivas    MRN: [id number] DOB: 05-21-1951  01/07/2020  [name] was observed post Covid-19 immunization for 15 minutes without incident. He was provided with Vaccine Information Sheet and instruction to access the V-Safe system.   [name] was instructed to call 911 with any severe reactions post vaccine: Marland Kitchen Difficulty breathing  . Swelling of face and throat  . A fast heartbeat  . A bad rash all over body  . Dizziness and weakness   Immunizations Administered    Name Date Dose VIS Date Route   Pfizer COVID-19 Vaccine 01/07/2020  2:57 PM 0.3 mL 10/11/2019 Intramuscular   Manufacturer: [employer]   Lot: [id number]   [place]: [id number]

## 2020-01-08 ENCOUNTER — Ambulatory Visit: Payer: Medicare Other

## 2020-01-30 ENCOUNTER — Other Ambulatory Visit: Payer: Self-pay

## 2020-01-30 MED ORDER — DULOXETINE HCL 30 MG PO CPEP: ORAL_CAPSULE | ORAL | 3 refills | Status: AC

## 2020-06-22 ENCOUNTER — Other Ambulatory Visit: Payer: Self-pay | Admitting: Urology

## 2020-07-23 ENCOUNTER — Other Ambulatory Visit (HOSPITAL_COMMUNITY)
Admission: RE | Admit: 2020-07-23 | Discharge: 2020-07-23 | Disposition: A | Discharge status: Home / Self Care | Payer: Medicare Other | Source: Ambulatory Visit | Attending: Urology | Admitting: Urology

## 2020-07-23 DIAGNOSIS — Z01812 Encounter for preprocedural laboratory examination: Secondary | ICD-10-CM | POA: Insufficient documentation

## 2020-07-23 DIAGNOSIS — Z20822 Contact with and (suspected) exposure to covid-19: Secondary | ICD-10-CM | POA: Diagnosis not present

## 2020-07-23 LAB — SARS CORONAVIRUS 2 (TAT 6-24 HRS): SARS Coronavirus 2: NEGATIVE

## 2020-07-23 NOTE — H&P (Signed)
  Pt scheduled for ESWL of right renal calculus.  He did not stop meloxicam peroperatively.   His stone remains in the renal pelvis and his ESWL will need to be cancelled today and rescheduled.  He was instructed that he will need to be off all NSAIDs for at least 48 hrs prior to treatment.

## 2020-07-24 ENCOUNTER — Encounter (HOSPITAL_BASED_OUTPATIENT_CLINIC_OR_DEPARTMENT_OTHER): Payer: Self-pay | Admitting: Urology

## 2020-07-24 NOTE — Progress Notes (Signed)
Talked with pt instructions given . Arrival time 65. Npo after MN, driver is wife. States does not use CPAP since losing weight and AC 1 is down

## 2020-07-27 ENCOUNTER — Ambulatory Visit (HOSPITAL_COMMUNITY): Payer: Medicare Other

## 2020-07-27 ENCOUNTER — Ambulatory Visit (HOSPITAL_BASED_OUTPATIENT_CLINIC_OR_DEPARTMENT_OTHER)
Admission: RE | Admit: 2020-07-27 | Discharge: 2020-07-27 | Disposition: A | Discharge status: Home / Self Care | Payer: Medicare Other | Attending: Urology | Admitting: Urology

## 2020-07-27 ENCOUNTER — Encounter (HOSPITAL_BASED_OUTPATIENT_CLINIC_OR_DEPARTMENT_OTHER)
Admission: RE | Disposition: A | Discharge status: Home / Self Care | Payer: Self-pay | Source: Home / Self Care | Attending: Urology

## 2020-07-27 DIAGNOSIS — Z5309 Procedure and treatment not carried out because of other contraindication: Secondary | ICD-10-CM | POA: Insufficient documentation

## 2020-07-27 DIAGNOSIS — N200 Calculus of kidney: Secondary | ICD-10-CM | POA: Insufficient documentation

## 2020-07-27 SURGERY — LITHOTRIPSY, ESWL
Anesthesia: LOCAL | Laterality: Right

## 2020-07-27 MED ORDER — DIAZEPAM 5 MG PO TABS
ORAL_TABLET | ORAL | Status: AC
  Filled 2020-07-27: qty 2

## 2020-07-27 MED ORDER — SODIUM CHLORIDE 0.9 % IV SOLN: INTRAVENOUS | Status: DC

## 2020-07-27 MED ORDER — CIPROFLOXACIN HCL 500 MG PO TABS
ORAL_TABLET | ORAL | Status: AC
  Filled 2020-07-27: qty 1

## 2020-07-27 MED ORDER — CIPROFLOXACIN HCL 500 MG PO TABS: 500.0000 mg | ORAL_TABLET | ORAL | Status: DC

## 2020-07-27 MED ORDER — DIPHENHYDRAMINE HCL 25 MG PO CAPS: 25.0000 mg | ORAL_CAPSULE | ORAL | Status: DC

## 2020-07-27 MED ORDER — DIPHENHYDRAMINE HCL 25 MG PO CAPS
ORAL_CAPSULE | ORAL | Status: AC
  Filled 2020-07-27: qty 1

## 2020-07-27 MED ORDER — DIAZEPAM 5 MG PO TABS: 10.0000 mg | ORAL_TABLET | ORAL | Status: DC

## 2020-07-27 NOTE — Progress Notes (Signed)
Pt took Meloxicam this morning. Dr. Alinda Money made aware.  Pt canceled.  He will be in touch with the office and hopefully reschedule for Thursday.

## 2020-07-29 ENCOUNTER — Other Ambulatory Visit: Payer: Self-pay | Admitting: Urology

## 2020-08-24 ENCOUNTER — Other Ambulatory Visit (HOSPITAL_COMMUNITY)
Admission: RE | Admit: 2020-08-24 | Discharge: 2020-08-24 | Disposition: A | Discharge status: Home / Self Care | Payer: Medicare Other | Source: Ambulatory Visit | Attending: Urology | Admitting: Urology

## 2020-08-24 DIAGNOSIS — Z01812 Encounter for preprocedural laboratory examination: Secondary | ICD-10-CM | POA: Diagnosis present

## 2020-08-24 DIAGNOSIS — Z20822 Contact with and (suspected) exposure to covid-19: Secondary | ICD-10-CM | POA: Diagnosis not present

## 2020-08-24 LAB — SARS CORONAVIRUS 2 (TAT 6-24 HRS): SARS Coronavirus 2: NEGATIVE

## 2020-08-25 NOTE — Progress Notes (Signed)
Patient to arrive at 0800 on 08/27/2020. History and medications reviewed. All pre-procedure instructions given. NPO after MN Wednesday except for clear liquids until 0600 and BP medication morning of ESWL. Driver secured.

## 2020-08-26 NOTE — H&P (Signed)
1. Locally advanced renal cell carcinoma  2. Prostate cancer  3. Incontinence  4. Urolithiasis   He follows up today for surveillance of his prostate and kidney cancer. He is now 2 and half years out from his partial nephrectomy for locally advanced renal cell carcinoma. He has denied any hematuria, unintentional weight loss, new pain symptoms, or other specific complaints. He follows up today with surveillance imaging and laboratory studies.   He is now approximately 9 months out from his radical prostatectomy. His incontinence has significantly improved and he is typically wearing 1 pad per day. He only leaks with increased activity. Overall from this is not particularly bothersome but still present.       MEDICATIONS: Hydrocodone-Acetaminophen 5 mg-325 mg tablet 1-2 tablet PO Q 6 H  Aspir 81  ATORVASTATIN CALCIUM PO Weekly  Gabapentin 600 mg tablet Oral  LOSARTAN-HYDROCHLOROTHIAZIDE PO Daily  Meloxicam 15 mg tablet  RANITIDINE HCL PO Daily     GU PSH: Cysto Bladder Stone >2.5cm - 06/20/2019 Cystoscopy Insert Stent, Left - 06/20/2019 Laparoscopy; Lymphadenectomy - 08/19/2019 Locm 300-399Mg/Ml Iodine,1Ml - 05/18/2020, 11/13/2019, 05/17/2019, 10/17/2018, 2019, 2018 Partial nephrectomy (laparoscopic), Right - 2018 Prostate Needle Biopsy - 07/02/2019 Robotic Radical Prostatectomy - 08/19/2019 Ureteroscopic stone removal, Left - 06/20/2019       PSH Notes: Exploration Of Spinal Fusion, Back Surgery   NON-GU PSH: Back Surgery (Unspecified) Explore Spinal Fusion - 2011 Surgical Pathology, Gross And Microscopic Examination For Prostate Needle - 07/02/2019     GU PMH: Stress Incontinence - 08/27/2019 Prostate Cancer - 07/23/2019 Renal cell carcinoma, right - 2018 ED due to arterial insufficiency, Erectile dysfunction due to arterial insufficiency - 2014 History of urolithiasis, Nephrolithiasis - 2014      PMH Notes:   ** His medical history is significant for diabetes, hypertension,  sleep apnea (he uses CPAP), and acid reflux and a history of a TIA multiple years ago. He also has multiple issues related to arthritis and has had multiple lumbar back surgeries including lumbar back surgery with what sounds like a fusion performed on 07/11/17.   1) BPH/LUTS: After lumbar back surgery in September 2018, he developed acute urinary retention and presented to the emergency department on 07/15/17 with overflow incontinence and urinary frequency. He had a urinary catheter placed that relieved his discomfort immediately. His prostate was measured at over 120 cc. He ultimately underwent a radical prostatectomy for prostate cancer in October 2020.   Current treatment: Finasteride and tamsulosin  Aug 2020: Removal of large bladder calculus  Oct 2020: Radical prostatectomy for prostate cancer (stopped meds)   2) Renal cell carcinoma: He is s/p right RAL partial nephrectomy on 09/14/17 for a 6.0 cm right renal mass.   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   Diagnosis: pT2 N0 Mx, Gleason 3+4=7 adenocarcinoma with negative surgical margins  Pretreatment PSA: 8.53 (on finasteride)  Pretreatment SHIM score: 7   4) Urolithiasis: He does have nonobstructing bilateral urolithiasis noted on his CT scan from 07/15/17. He has not been symptomatic related to the stones.   Aug 2020: L ureteroscopic stone removal ( )     NON-GU PMH: Arthritis Diabetes Type 2 GERD Glaucoma Hypercholesterolemia Hypertension Sleep Apnea Stroke/TIA    FAMILY HISTORY: 1 Daughter - Daughter 1 son - Son Death In The Family Father -  Runs In Family nephrolithiasis - Father Prostate Cancer - Father   SOCIAL HISTORY: Marital Status: Married Preferred Language: English Current Smoking Status: Patient does not smoke anymore.   Tobacco Use  Assessment Completed: Used Tobacco in last 30 days? Does not use smokeless tobacco. Has never drank.  Does not use drugs. Drinks 3 caffeinated drinks per day. Has not had a blood transfusion. 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     Notes: Former smoker, Occupation:, Alcohol Use, Marital History - Currently Married, Caffeine Use   REVIEW OF SYSTEMS:    GU Review Male:   Patient denies frequent urination, hard to postpone urination, burning/ pain with urination, get up at night to urinate, leakage of urine, stream starts and stops, trouble starting your streams, and have to strain to urinate .  Gastrointestinal (Upper):   Patient denies nausea and vomiting.  Gastrointestinal (Lower):   Patient denies diarrhea and constipation.  Constitutional:   Patient denies fever, night sweats, weight loss, and fatigue.  Skin:   Patient denies skin rash/ lesion and itching.  Eyes:   Patient denies blurred vision and double vision.  Ears/ Nose/ Throat:   Patient denies sore throat and sinus problems.  Hematologic/Lymphatic:   Patient denies swollen glands and easy bruising.  Cardiovascular:   Patient denies leg swelling and chest pains.  Respiratory:   Patient denies shortness of breath and cough.  Endocrine:   Patient denies excessive thirst.  Musculoskeletal:   Patient denies back pain and joint pain.  Neurological:   Patient denies headaches and dizziness.  Psychologic:   Patient denies depression and anxiety.   VITAL SIGNS:      05/20/2020 08:38 AM  Weight 265 lb / 120.2 kg  Height 69 in / 175.26 cm  BP 127/65 mmHg  Pulse 76 /min  Temperature 96.6 F / 35.8 C  BMI 39.1 kg/m   MULTI-SYSTEM PHYSICAL EXAMINATION:    Constitutional: Well-nourished. No physical deformities. Normally developed. Good grooming.  Neck: Neck symmetrical, not swollen. Normal tracheal position.  Respiratory: No labored breathing, no use of accessory muscles. Clear bilaterally.  Cardiovascular: Normal  temperature, normal extremity pulses, no swelling, no varicosities. Regular rate and rhythm.  Lymphatic: No enlargement of neck, axillae, groin.  Skin: No paleness, no jaundice, no cyanosis. No lesion, no ulcer, no rash.  Neurologic / Psychiatric: Oriented to time, oriented to place, oriented to person. No depression, no anxiety, no agitation.  Gastrointestinal: No mass, no tenderness, no rigidity, obese abdomen. Incisions well healed.  Eyes: Normal conjunctivae. Normal eyelids.  Ears, Nose, Mouth, and Throat: Left ear no scars, no lesions, no masses. Right ear no scars, no lesions, no masses. Nose no scars, no lesions, no masses. Normal hearing. Normal lips.  Musculoskeletal: Normal gait and station of head and neck.     Complexity of Data:  Lab Test Review:   PSA, CMP  Records Review:   Previous Patient Records  X-Ray Review: KUB: Reviewed Films.  C.T. Chest/ Abd/Pelvis: Reviewed Films.     05/15/20 11/13/19 05/13/19 05/03/19 10/11/18 07/23/18 03/29/18 02/14/13  PSA  Total PSA <0.015 ng/mL <0.015 ng/mL 8.53 ng/mL 7.15 ng/mL 4.83 ng/mL 4.14 ng/mL 5.44 ng/mL 4.39   Free PSA   2.42 ng/mL 1.88 ng/mL   1.74 ng/mL   % Free PSA   28 % PSA 26 % PSA   32 % PSA     09/05/06  Hormones  Testosterone, Total 2.68    Notes:  CT CHEST, ABDOMEN AND PELVIS WITHOUT AND WITH CONTRAST 05/18/2020   --------------------------------------------------------------------------------   Marvin Rivas  MRN: [id number] Ordering Provider: Raynelle Bring, JR  DOB: 1951/01/06 Date Collected: 05/18/2020 08:00:00  SSN-**[id number] Date Completed: 05/18/2020 10:16:49   --------------------------------------------------------------------------------   CLINICAL DATA: Status post partial right nephrectomy in 2018 for  renal cell carcinoma. History of prostatectomy 08/19/2019 for  prostate cancer. Restaging.   EXAM:  CT CHEST, ABDOMEN, AND PELVIS WITHOUT AND WITH CONTRAST   TECHNIQUE:  Multidetector CT  imaging of the chest, abdomen and pelvis was  performed following the standard protocol before and during bolus  administration of intravenous contrast.   CONTRAST: 125 cc Omnipaque 300 IV.   COMPARISON: 11/13/2019 CT chest, abdomen and pelvis.   FINDINGS:  CT CHEST FINDINGS   Cardiovascular: Normal heart size. No significant pericardial  effusion/thickening. Great vessels are normal in course and caliber.  No central pulmonary emboli.   Mediastinum/Nodes: Subcentimeter calcified posterior right thyroid  nodule, stable. Not clinically significant; no follow-up imaging  recommended (ref: J Am Coll Radiol. 2015 Feb;12(2): 143-50).  Unremarkable esophagus. No pathologically enlarged axillary,  mediastinal or hilar lymph nodes.   Lungs/Pleura: No pneumothorax. No pleural effusion. No acute  consolidative airspace disease, lung masses or significant pulmonary  nodules. Subcentimeter calcified right upper lobe granuloma is  stable.   Musculoskeletal: No aggressive appearing focal osseous lesions.  Moderate thoracic spondylosis. Symmetric moderate bilateral  gynecomastia, stable.   CT ABDOMEN PELVIS FINDINGS   Hepatobiliary: Normal liver size. Stable subcentimeter hypodense  segment 2 left liver lesion, too small to characterize. No new liver  lesions. Normal gallbladder with no radiopaque cholelithiasis. No  biliary ductal dilatation.   Pancreas: Normal, with no mass or duct dilation.   Spleen: Normal size. No mass.   Adrenals/Urinary Tract: Normal adrenals. Right renal pelvis 13 mm  stone. Chronic urothelial wall thickening throughout the right renal  pelvis, unchanged. Nonobstructing 5 mm lower right renal stone.  Nonobstructing 3 mm interpolar left renal stone. No overt  hydronephrosis. Stable postsurgical changes from partial nephrectomy  in the anterior lower right kidney with no evidence of enhancing  recurrent mass in the partial nephrectomy bed. Stable nonenhancing   hyperdense 1.1 cm renal cortical lesion in the anterior lower right  kidney (series 5/image 107), compatible with a Bosniak category 2  hemorrhagic/proteinaceous renal cysts. Exophytic 1.3 cm simple renal  cyst in the medial upper right kidney. Partially exophytic simple  renal cyst measuring 3.6 cm in the posterior interpolar left kidney.  Additional subcentimeter hypodense renal cortical lesions in right  kidney are too small to characterize. Normal bladder.   Stomach/Bowel: Normal non-distended stomach. Normal caliber small  bowel with no small bowel wall thickening. Normal appendix. Normal  large bowel with no diverticulosis, large bowel wall thickening or  pericolonic fat stranding.   Vascular/Lymphatic: Atherosclerotic nonaneurysmal abdominal aorta.  Patent portal, splenic, hepatic and renal veins. No pathologically  enlarged lymph nodes in the abdomen or pelvis.   Reproductive: Prostatectomy. No recurrent mass or fluid collection  in the prostatectomy bed.   Other: No pneumoperitoneum, ascites or focal fluid collection.   Musculoskeletal: No aggressive appearing focal osseous lesions.  Status post bilateral posterior spinal fusion L2-L4 with L5  laminectomy. Marked lumbar spondylosis.   IMPRESSION:  1. No evidence of local tumor recurrence in the partial nephrectomy  bed in the lower right kidney.  2. No evidence of metastatic disease in the chest, abdomen or  pelvis.  3. Chronic right renal  pelvis 13 mm stone with chronic urothelial  wall thickening throughout the right renal pelvis, unchanged,  favoring chronic inflammatory change. Additional nonobstructing  stones in both kidneys.  4. Aortic Atherosclerosis (ICD10-I70.0).    Electronically Signed  By: Ilona Sorrel M.D.  On: 05/18/2020 10:14   Creatinine 0.9, LFTs normal   I independently reviewed his KUB x-ray. This does demonstrate a review take 12-13 mm right UPJ calculus. Other small calcifications in the  expected course of the renal shadows are present.   PROCEDURES:         KUB - [id number]  A single view of the abdomen is obtained.      Patient confirmed No Neulasta OnPro Device.           Urinalysis w/Scope Dipstick Dipstick Cont'd Micro  Color: Amber Bilirubin: Neg mg/dL WBC/hpf: >60/hpf  Appearance: Cloudy Ketones: Neg mg/dL RBC/hpf: >60/hpf  Specific Gravity: 1.020 Blood: 3+ ery/uL Bacteria: Many (>50/hpf)  pH: 5.5 Protein: 2+ mg/dL Cystals: Amorph Urates  Glucose: Neg mg/dL Urobilinogen: 0.2 mg/dL Casts: NS (Not Seen)    Nitrites: Neg Trichomonas: Not Present    Leukocyte Esterase: 3+ leu/uL Mucous: Present      Epithelial Cells: 0 - 5/hpf      Yeast: NS (Not Seen)      Sperm: Not Present    ASSESSMENT:      ICD-10 Details  1 GU:   Prostate Cancer - C61   2   Renal cell carcinoma, right - C64.1   3   Renal calculus - N20.0   4   Stress Incontinence - N39.3    PLAN:           Orders X-Rays: KUB          Schedule Labs: 6 Months - PSA    6 Months - CMP    6 Months - Urinalysis  X-Rays: 6 Months - C.T. Chest With I.V. Contrast    6 Months - C.T. Abdomen/Pelvis With and Without I.V. Contrast  Return Visit/Planned Activity: 6 Months - Office Visit          Document Letter(s):  Created for Patient: Clinical Summary         Notes:   1. Locally advanced renal cell carcinoma: No evidence of recurrence. Follow-up in 6 months with repeat chest and abdominal imaging per NCCN guidelines.   2. Prostate cancer: No evidence of recurrence. Follow-up in 6 months for continued surveillance.   3. Incontinence: This is improving. Continue pelvic floor exercises. If he does not reach his goal, he will notify me and we can reinstitute pelvic floor physical therapy.   4. Right renal calculus: His right renal pelvic stone has continued to increase in size with increased edema in the renal pelvis. I have recommended that we strongly consider treatment. After reviewing options  including shockwave lithotripsy, percutaneous treatment, and ureteroscopic treatment, he elects shockwave lithotripsy. His KUB suggest that this would be amenable to this approach. We reviewed the potential risks and the expected recovery process. He gives informed consent to proceed. This will be scheduled electively.

## 2020-08-27 ENCOUNTER — Other Ambulatory Visit: Payer: Self-pay

## 2020-08-27 ENCOUNTER — Encounter (HOSPITAL_BASED_OUTPATIENT_CLINIC_OR_DEPARTMENT_OTHER): Payer: Self-pay | Admitting: Urology

## 2020-08-27 ENCOUNTER — Ambulatory Visit (HOSPITAL_COMMUNITY): Payer: Medicare Other

## 2020-08-27 ENCOUNTER — Encounter (HOSPITAL_BASED_OUTPATIENT_CLINIC_OR_DEPARTMENT_OTHER)
Admission: RE | Disposition: A | Discharge status: Home / Self Care | Payer: Self-pay | Source: Home / Self Care | Attending: Urology

## 2020-08-27 ENCOUNTER — Ambulatory Visit (HOSPITAL_BASED_OUTPATIENT_CLINIC_OR_DEPARTMENT_OTHER)
Admission: RE | Admit: 2020-08-27 | Discharge: 2020-08-27 | Disposition: A | Discharge status: Home / Self Care | Payer: Medicare Other | Attending: Urology | Admitting: Urology

## 2020-08-27 DIAGNOSIS — Z7982 Long term (current) use of aspirin: Secondary | ICD-10-CM | POA: Diagnosis not present

## 2020-08-27 DIAGNOSIS — G4730 Sleep apnea, unspecified: Secondary | ICD-10-CM | POA: Insufficient documentation

## 2020-08-27 DIAGNOSIS — N393 Stress incontinence (female) (male): Secondary | ICD-10-CM | POA: Insufficient documentation

## 2020-08-27 DIAGNOSIS — Z79899 Other long term (current) drug therapy: Secondary | ICD-10-CM | POA: Diagnosis not present

## 2020-08-27 DIAGNOSIS — Z841 Family history of disorders of kidney and ureter: Secondary | ICD-10-CM | POA: Insufficient documentation

## 2020-08-27 DIAGNOSIS — K219 Gastro-esophageal reflux disease without esophagitis: Secondary | ICD-10-CM | POA: Insufficient documentation

## 2020-08-27 DIAGNOSIS — Z905 Acquired absence of kidney: Secondary | ICD-10-CM | POA: Diagnosis not present

## 2020-08-27 DIAGNOSIS — Z8042 Family history of malignant neoplasm of prostate: Secondary | ICD-10-CM | POA: Insufficient documentation

## 2020-08-27 DIAGNOSIS — Z9079 Acquired absence of other genital organ(s): Secondary | ICD-10-CM | POA: Insufficient documentation

## 2020-08-27 DIAGNOSIS — Z791 Long term (current) use of non-steroidal anti-inflammatories (NSAID): Secondary | ICD-10-CM | POA: Diagnosis not present

## 2020-08-27 DIAGNOSIS — E7800 Pure hypercholesterolemia, unspecified: Secondary | ICD-10-CM | POA: Diagnosis not present

## 2020-08-27 DIAGNOSIS — E119 Type 2 diabetes mellitus without complications: Secondary | ICD-10-CM | POA: Insufficient documentation

## 2020-08-27 DIAGNOSIS — N132 Hydronephrosis with renal and ureteral calculous obstruction: Secondary | ICD-10-CM | POA: Diagnosis not present

## 2020-08-27 DIAGNOSIS — Z87891 Personal history of nicotine dependence: Secondary | ICD-10-CM | POA: Diagnosis not present

## 2020-08-27 DIAGNOSIS — C641 Malignant neoplasm of right kidney, except renal pelvis: Secondary | ICD-10-CM | POA: Diagnosis not present

## 2020-08-27 DIAGNOSIS — Z8673 Personal history of transient ischemic attack (TIA), and cerebral infarction without residual deficits: Secondary | ICD-10-CM | POA: Diagnosis not present

## 2020-08-27 DIAGNOSIS — N200 Calculus of kidney: Secondary | ICD-10-CM

## 2020-08-27 DIAGNOSIS — C61 Malignant neoplasm of prostate: Secondary | ICD-10-CM | POA: Diagnosis not present

## 2020-08-27 DIAGNOSIS — I10 Essential (primary) hypertension: Secondary | ICD-10-CM | POA: Diagnosis not present

## 2020-08-27 HISTORY — PX: EXTRACORPOREAL SHOCK WAVE LITHOTRIPSY: SHX1557

## 2020-08-27 SURGERY — LITHOTRIPSY, ESWL
Anesthesia: LOCAL | Laterality: Right

## 2020-08-27 MED ORDER — DIAZEPAM 5 MG PO TABS
ORAL_TABLET | ORAL | Status: AC
  Filled 2020-08-27: qty 2

## 2020-08-27 MED ORDER — DIPHENHYDRAMINE HCL 25 MG PO CAPS
25.0000 mg | ORAL_CAPSULE | ORAL | Status: AC
  Administered 2020-08-27: 25 mg via ORAL

## 2020-08-27 MED ORDER — DIAZEPAM 5 MG PO TABS
10.0000 mg | ORAL_TABLET | ORAL | Status: AC
  Administered 2020-08-27: 10 mg via ORAL

## 2020-08-27 MED ORDER — DIPHENHYDRAMINE HCL 25 MG PO CAPS
ORAL_CAPSULE | ORAL | Status: AC
  Filled 2020-08-27: qty 1

## 2020-08-27 MED ORDER — CIPROFLOXACIN HCL 500 MG PO TABS
ORAL_TABLET | ORAL | Status: AC
  Filled 2020-08-27: qty 1

## 2020-08-27 MED ORDER — SODIUM CHLORIDE 0.9 % IV SOLN: INTRAVENOUS | Status: DC

## 2020-08-27 MED ORDER — HYDROCODONE-ACETAMINOPHEN 5-325 MG PO TABS
1.0000 | ORAL_TABLET | Freq: Four times a day (QID) | ORAL | 0 refills | Status: DC | PRN
Start: 2020-08-27 — End: 2021-02-23

## 2020-08-27 MED ORDER — TAMSULOSIN HCL 0.4 MG PO CAPS: 0.4000 mg | ORAL_CAPSULE | Freq: Every day | ORAL | 0 refills | Status: DC

## 2020-08-27 MED ORDER — CIPROFLOXACIN HCL 500 MG PO TABS
500.0000 mg | ORAL_TABLET | ORAL | Status: AC
  Administered 2020-08-27: 500 mg via ORAL

## 2020-08-27 NOTE — Op Note (Signed)
See [facility] operative note scanned into chart. Also because of the size, density, location and other factors that cannot be anticipated I feel this will likely be a staged procedure. This fact supersedes any indication in the scanned [facility] note to the contrary.  

## 2020-08-27 NOTE — Discharge Instructions (Signed)
1. You should strain your urine and collect all fragments and bring them to your follow up appointment.  2. You should take your pain medication as needed.  Please call if your pain is severe to the point that it is not controlled with your pain medication. 3. You should call if you develop fever > 101 or persistent nausea or vomiting. 4. Your doctor may prescribe tamsulosin to take to help facilitate stone passage.    Lithotripsy, Care After This sheet gives you information about how to care for yourself after your procedure. Your health care provider may also give you more specific instructions. If you have problems or questions, contact your health care provider. What can I expect after the procedure? After the procedure, it is common to have:  Some blood in your urine. This should only last for a few days.  Soreness in your back, sides, or upper abdomen for a few days.  Blotches or bruises on your back where the pressure wave entered the skin.  Pain, discomfort, or nausea when pieces (fragments) of the kidney stone move through the tube that carries urine from the kidney to the bladder (ureter). Stone fragments may pass soon after the procedure, but they may continue to pass for up to 4-8 weeks. ? If you have severe pain or nausea, contact your health care provider. This may be caused by a large stone that was not broken up, and this may mean that you need more treatment.  Some pain or discomfort during urination.  Some pain or discomfort in the lower abdomen or (in men) at the base of the penis. Follow these instructions at home: Medicines  Take over-the-counter and prescription medicines only as told by your health care provider.  If you were prescribed an antibiotic medicine, take it as told by your health care provider. Do not stop taking the antibiotic even if you start to feel better.  Do not drive for 24 hours if you were given a medicine to help you relax (sedative).  Do  not drive or use heavy machinery while taking prescription pain medicine. Eating and drinking      Drink enough water and fluids to keep your urine clear or pale yellow. This helps any remaining pieces of the stone to pass. It can also help prevent new stones from forming.  Eat plenty of fresh fruits and vegetables.  Follow instructions from your health care provider about eating and drinking restrictions. You may be instructed: ? To reduce how much salt (sodium) you eat or drink. Check ingredients and nutrition facts on packaged foods and beverages. ? To reduce how much meat you eat.  Eat the recommended amount of calcium for your age and gender. Ask your health care provider how much calcium you should have. General instructions  Get plenty of rest.  Most people can resume normal activities 1-2 days after the procedure. Ask your health care provider what activities are safe for you.  Your health care provider may direct you to lie in a certain position (postural drainage) and tap firmly (percuss) over your kidney area to help stone fragments pass. Follow instructions as told by your health care provider.  If directed, strain all urine through the strainer that was provided by your health care provider. ? Keep all fragments for your health care provider to see. Any stones that are found may be sent to a medical lab for examination. The stone may be as small as a grain of salt.  Keep all follow-up visits as told by your health care provider. This is important. Contact a health care provider if:  You have pain that is severe or does not get better with medicine.  You have nausea that is severe or does not go away.  You have blood in your urine longer than your health care provider told you to expect.  You have more blood in your urine.  You have pain during urination that does not go away.  You urinate more frequently than usual and this does not go away.  You develop a rash  or any other possible signs of an allergic reaction. Get help right away if:  You have severe pain in your back, sides, or upper abdomen.  You have severe pain while urinating.  Your urine is very dark red.  You have blood in your stool (feces).  You cannot pass any urine at all.  You feel a strong urge to urinate after emptying your bladder.  You have a fever or chills.  You develop shortness of breath, difficulty breathing, or chest pain.  You have severe nausea that leads to persistent vomiting.  You faint. Summary  After this procedure, it is common to have some pain, discomfort, or nausea when pieces (fragments) of the kidney stone move through the tube that carries urine from the kidney to the bladder (ureter). If this pain or nausea is severe, however, you should contact your health care provider.  Most people can resume normal activities 1-2 days after the procedure. Ask your health care provider what activities are safe for you.  Drink enough water and fluids to keep your urine clear or pale yellow. This helps any remaining pieces of the stone to pass, and it can help prevent new stones from forming.  If directed, strain your urine and keep all fragments for your health care provider to see. Fragments or stones may be as small as a grain of salt.  Get help right away if you have severe pain in your back, sides, or upper abdomen or have severe pain while urinating. This information is not intended to replace advice given to you by your health care provider. Make sure you discuss any questions you have with your health care provider. Document Revised: 01/28/2019 Document Reviewed: 09/07/2016 Elsevier Patient Education  2020 [employer].

## 2020-08-28 ENCOUNTER — Encounter (HOSPITAL_BASED_OUTPATIENT_CLINIC_OR_DEPARTMENT_OTHER): Payer: Self-pay | Admitting: Urology

## 2020-11-20 DIAGNOSIS — H52203 Unspecified astigmatism, bilateral: Secondary | ICD-10-CM | POA: Diagnosis not present

## 2020-11-20 DIAGNOSIS — E1136 Type 2 diabetes mellitus with diabetic cataract: Secondary | ICD-10-CM | POA: Diagnosis not present

## 2020-11-20 DIAGNOSIS — H40003 Preglaucoma, unspecified, bilateral: Secondary | ICD-10-CM | POA: Diagnosis not present

## 2020-11-20 DIAGNOSIS — H25813 Combined forms of age-related cataract, bilateral: Secondary | ICD-10-CM | POA: Diagnosis not present

## 2020-11-20 DIAGNOSIS — H524 Presbyopia: Secondary | ICD-10-CM | POA: Diagnosis not present

## 2020-11-25 DIAGNOSIS — H25011 Cortical age-related cataract, right eye: Secondary | ICD-10-CM | POA: Diagnosis not present

## 2020-11-25 DIAGNOSIS — H2511 Age-related nuclear cataract, right eye: Secondary | ICD-10-CM | POA: Diagnosis not present

## 2020-11-25 DIAGNOSIS — H25812 Combined forms of age-related cataract, left eye: Secondary | ICD-10-CM | POA: Diagnosis not present

## 2020-12-02 DIAGNOSIS — H2512 Age-related nuclear cataract, left eye: Secondary | ICD-10-CM | POA: Diagnosis not present

## 2020-12-02 DIAGNOSIS — H25012 Cortical age-related cataract, left eye: Secondary | ICD-10-CM | POA: Diagnosis not present

## 2020-12-03 DIAGNOSIS — C61 Malignant neoplasm of prostate: Secondary | ICD-10-CM | POA: Diagnosis not present

## 2020-12-03 DIAGNOSIS — C641 Malignant neoplasm of right kidney, except renal pelvis: Secondary | ICD-10-CM | POA: Diagnosis not present

## 2020-12-07 DIAGNOSIS — J984 Other disorders of lung: Secondary | ICD-10-CM | POA: Diagnosis not present

## 2020-12-07 DIAGNOSIS — N200 Calculus of kidney: Secondary | ICD-10-CM | POA: Diagnosis not present

## 2020-12-07 DIAGNOSIS — C641 Malignant neoplasm of right kidney, except renal pelvis: Secondary | ICD-10-CM | POA: Diagnosis not present

## 2020-12-08 DIAGNOSIS — I10 Essential (primary) hypertension: Secondary | ICD-10-CM | POA: Diagnosis not present

## 2020-12-09 DIAGNOSIS — N393 Stress incontinence (female) (male): Secondary | ICD-10-CM | POA: Diagnosis not present

## 2020-12-09 DIAGNOSIS — C61 Malignant neoplasm of prostate: Secondary | ICD-10-CM | POA: Diagnosis not present

## 2020-12-09 DIAGNOSIS — N5201 Erectile dysfunction due to arterial insufficiency: Secondary | ICD-10-CM | POA: Diagnosis not present

## 2020-12-09 DIAGNOSIS — N200 Calculus of kidney: Secondary | ICD-10-CM | POA: Diagnosis not present

## 2020-12-15 ENCOUNTER — Ambulatory Visit: Payer: Medicare Other | Admitting: Adult Health

## 2021-01-08 DIAGNOSIS — N200 Calculus of kidney: Secondary | ICD-10-CM | POA: Diagnosis not present

## 2021-01-09 DIAGNOSIS — N200 Calculus of kidney: Secondary | ICD-10-CM | POA: Diagnosis not present

## 2021-02-10 DIAGNOSIS — K648 Other hemorrhoids: Secondary | ICD-10-CM | POA: Diagnosis not present

## 2021-02-10 DIAGNOSIS — K6289 Other specified diseases of anus and rectum: Secondary | ICD-10-CM | POA: Diagnosis not present

## 2021-02-10 DIAGNOSIS — K621 Rectal polyp: Secondary | ICD-10-CM | POA: Diagnosis not present

## 2021-02-10 DIAGNOSIS — K635 Polyp of colon: Secondary | ICD-10-CM | POA: Diagnosis not present

## 2021-02-10 DIAGNOSIS — Z86010 Personal history of colonic polyps: Secondary | ICD-10-CM | POA: Diagnosis not present

## 2021-02-10 DIAGNOSIS — D124 Benign neoplasm of descending colon: Secondary | ICD-10-CM | POA: Diagnosis not present

## 2021-02-10 DIAGNOSIS — D125 Benign neoplasm of sigmoid colon: Secondary | ICD-10-CM | POA: Diagnosis not present

## 2021-02-10 DIAGNOSIS — Z1211 Encounter for screening for malignant neoplasm of colon: Secondary | ICD-10-CM | POA: Diagnosis not present

## 2021-02-10 DIAGNOSIS — D122 Benign neoplasm of ascending colon: Secondary | ICD-10-CM | POA: Diagnosis not present

## 2021-02-12 DIAGNOSIS — C61 Malignant neoplasm of prostate: Secondary | ICD-10-CM | POA: Diagnosis not present

## 2021-02-12 DIAGNOSIS — K219 Gastro-esophageal reflux disease without esophagitis: Secondary | ICD-10-CM | POA: Diagnosis not present

## 2021-02-12 DIAGNOSIS — M1990 Unspecified osteoarthritis, unspecified site: Secondary | ICD-10-CM | POA: Diagnosis not present

## 2021-02-12 DIAGNOSIS — H409 Unspecified glaucoma: Secondary | ICD-10-CM | POA: Diagnosis not present

## 2021-02-12 DIAGNOSIS — I10 Essential (primary) hypertension: Secondary | ICD-10-CM | POA: Diagnosis not present

## 2021-02-12 DIAGNOSIS — E1149 Type 2 diabetes mellitus with other diabetic neurological complication: Secondary | ICD-10-CM | POA: Diagnosis not present

## 2021-02-12 DIAGNOSIS — E1142 Type 2 diabetes mellitus with diabetic polyneuropathy: Secondary | ICD-10-CM | POA: Diagnosis not present

## 2021-02-12 DIAGNOSIS — E7800 Pure hypercholesterolemia, unspecified: Secondary | ICD-10-CM | POA: Diagnosis not present

## 2021-02-12 DIAGNOSIS — C641 Malignant neoplasm of right kidney, except renal pelvis: Secondary | ICD-10-CM | POA: Diagnosis not present

## 2021-02-18 DIAGNOSIS — D124 Benign neoplasm of descending colon: Secondary | ICD-10-CM | POA: Diagnosis not present

## 2021-02-18 DIAGNOSIS — D122 Benign neoplasm of ascending colon: Secondary | ICD-10-CM | POA: Diagnosis not present

## 2021-02-18 DIAGNOSIS — K635 Polyp of colon: Secondary | ICD-10-CM | POA: Diagnosis not present

## 2021-02-18 DIAGNOSIS — K621 Rectal polyp: Secondary | ICD-10-CM | POA: Diagnosis not present

## 2021-02-18 DIAGNOSIS — D125 Benign neoplasm of sigmoid colon: Secondary | ICD-10-CM | POA: Diagnosis not present

## 2021-02-23 ENCOUNTER — Ambulatory Visit: Payer: Medicare HMO | Admitting: Adult Health

## 2021-02-23 ENCOUNTER — Other Ambulatory Visit: Payer: Self-pay

## 2021-02-23 VITALS — BP 161/72 | HR 69 | Ht 69.0 in | Wt 278.0 lb

## 2021-02-23 DIAGNOSIS — E1142 Type 2 diabetes mellitus with diabetic polyneuropathy: Secondary | ICD-10-CM | POA: Diagnosis not present

## 2021-02-23 NOTE — Progress Notes (Signed)
PATIENT: Marvin Rivas DOB: January 24, 1951  REASON FOR VISIT: follow up HISTORY FROM: patient Primary neurologist: Dr. Jannifer Franklin  HISTORY OF PRESENT ILLNESS: Today 02/23/21:  Marvin Rivas is a 70 year old male with a history of diabetes and diabetic peripheral neuropathy.  He returns today for follow-up.  He states that he has noticed some benefit with Cymbalta especially if he does not take it.  He continues on gabapentin.  Denies any significant changes with his gait or balance.  Reports that he does not always use his cane and he notices that his balance is off if he does not have a cane.  States that he had a fall 2 months ago.  Reports that he has burning and tingling in the lower extremities constantly.  Reports numbness in the hands due to carpal tunnel syndrome.  He does wear braces at night.  He drives using hand controls.  He returns today for an evaluation.  HISTORY [name] is a 70 year old right-handed white male with a history of diabetes in the past and a peripheral neuropathy.  The patient is on gabapentin at maximal doses taking 600 mg 4 times daily.  He has lost about 70 pounds of weight with a combination of renal and prostate cancer.  His appetite has now returned.  He is wondering whether he can come off CPAP because of the weight loss.  He has numbness up to the knees, he is able to operate a motor vehicle using hand controls as he cannot feel his feet to drive.  He has been doing this for about 7 years.  The patient reports that his hands are numb, he was told he had carpal tunnel syndrome but has not wanted to have surgery for this.  The patient has some chronic back pain, he has had 3 prior lumbosacral spine surgeries.  He will stumble on occasion, he generally will use a cane outside of the house but not in the house.  He has no steps or stairs to climb inside the house.  He returns to the office today for an evaluation.  He has a lot of pain in the feet if he goes barefoot, he is  still having some trouble sleeping at night.    REVIEW OF SYSTEMS: Out of a complete 14 system review of symptoms, the patient complains only of the following symptoms, and all other reviewed systems are negative.  See HPI  ALLERGIES: No Known Allergies  HOME MEDICATIONS: Outpatient Medications Prior to Visit  Medication Sig Dispense Refill  . atorvastatin (LIPITOR) 10 MG tablet Take 10 mg by mouth once a week. Sunday morning    . DULoxetine (CYMBALTA) 30 MG capsule Take one pill twice a day 180 capsule 3  . famotidine (PEPCID) 20 MG tablet Take 20 mg by mouth at bedtime.     . gabapentin (NEURONTIN) 600 MG tablet Take 1 tablet (600 mg total) by mouth 4 (four) times daily. 360 tablet 3  . losartan-hydrochlorothiazide (HYZAAR) 50-12.5 MG tablet Take 0.5 tablets daily by mouth.    Marland Kitchen OVER THE COUNTER MEDICATION Place 2 sprays into both nostrils as needed (for nasal congestion). [address] Nasal Spray [employer] Brand    . HYDROcodone-acetaminophen (NORCO/VICODIN) 5-325 MG tablet Take 1-2 tablets by mouth every 6 (six) hours as needed. 15 tablet 0  . tamsulosin (FLOMAX) 0.4 MG CAPS capsule Take 1 capsule (0.4 mg total) by mouth at bedtime. 14 capsule 0   No facility-administered medications prior to visit.  PAST MEDICAL HISTORY: Past Medical History:  Diagnosis Date  . Abnormality of gait 01/26/2015  . Arthritis   . Cancer [facility])    right kidney cancer and prostate cancer half of right kidney removed  . Carpal tunnel syndrome on both sides    neuropathy in feet  . Diabetes mellitus without complication ([place])    controlled with diet  . Dyslipidemia   . GERD (gastroesophageal reflux disease)   . Glaucoma    pt states he had a 2nd opthalmologist opinion and was told he did not have glaucoma  . History of kidney stones   . Hypertension   . Morbid obesity ([place])   . Neuropathy   . Neuropathy    legs  . OSA on CPAP    uses CPAP  . Polyneuropathy in diabetes(357.2) 01/23/2014  . Renal  neoplasm   . TIA (transient ischemic attack)    ?2013    PAST SURGICAL HISTORY: Past Surgical History:  Procedure Laterality Date  . ANTERIOR LAT LUMBAR FUSION N/A 07/11/2017   Procedure: L2-3 L3-4 Anteriolateral lumbar interbody fusion with posterior fixation and exploration/revision of previous fusion L4 to S1;  Surgeon: Erline Levine, MD;  Location: [facility];  Service: Neurosurgery;  Laterality: N/A;  L2-3 L3-4 Anteriolateral lumbar interbody fusion with posterior fixation and exploration/revision of previous fusion L4 to S1  . BACK SURGERY    . COLONOSCOPY    . CYSTOSCOPY/URETEROSCOPY/HOLMIUM LASER/STENT PLACEMENT Left 06/20/2019   Procedure: CYSTOSCOPY/ RETROGRADE/URETEROSCOPY/HOLMIUM LASER/STENT PLACEMENT/ CYSTOLITHALOPAXY;  Surgeon: Raynelle Bring, MD;  Location: [facility];  Service: Urology;  Laterality: Left;  . EXTRACORPOREAL SHOCK WAVE LITHOTRIPSY Right 08/27/2020   Procedure: EXTRACORPOREAL SHOCK WAVE LITHOTRIPSY (ESWL);  Surgeon: Raynelle Bring, MD;  [facility];  Service: Urology;  Laterality: Right;  . HOLMIUM LASER APPLICATION Left 01/21/5572   Procedure: HOLMIUM LASER APPLICATION;  Surgeon: Raynelle Bring, MD;  Location: [facility];  Service: Urology;  Laterality: Left;  . LUMBAR PERCUTANEOUS PEDICLE SCREW 2 LEVEL N/A 07/11/2017   Procedure: LUMBAR PERCUTANEOUS PEDICLE SCREW 2 LEVEL;  Surgeon: Erline Levine, MD;  Location: [facility];  Service: Neurosurgery;  Laterality: N/A;  . LYMPHADENECTOMY Bilateral 08/19/2019   Procedure: LYMPHADENECTOMY, PELVIC;  Surgeon: Raynelle Bring, MD;  Location: [facility];  Service: Urology;  Laterality: Bilateral;  . NASAL SEPTUM SURGERY    . ROBOT ASSISTED LAPAROSCOPIC RADICAL PROSTATECTOMY N/A 08/19/2019   Procedure: XI ROBOTIC ASSISTED LAPAROSCOPIC RADICAL PROSTATECTOMY LEVEL 3;  Surgeon: Raynelle Bring, MD;  Location: [facility];  Service: Urology;  Laterality: N/A;  . ROBOTIC ASSITED PARTIAL NEPHRECTOMY Right 09/14/2017   Procedure:  ROBOTIC ASSITED PARTIAL NEPHRECTOMY;  Surgeon: Raynelle Bring, MD;  Location: [facility];  Service: Urology;  Laterality: Right;  NEEDS 210 MIN FOR PROCEDURE  . SPINAL FUSION    . toenail removed     both big toenails removed as a child    FAMILY HISTORY: Family History  Problem Relation Age of Onset  . Prostate cancer Father   . Hypertension Mother     SOCIAL HISTORY: Social History   Socioeconomic History  . Marital status: Married    Spouse name: janyce  . Number of children: 2  . Years of education: 90  . Highest education level: Not on file  Occupational History  . Occupation: MANUFACTURING Lobbyist: [employer]  Tobacco Use  . Smoking status: Former Smoker    Quit date: 01/24/1999    Years since quitting: 22.0  . Smokeless tobacco: Never Used  Vaping Use  . Vaping Use: Never used  Substance and Sexual Activity  . Alcohol use: No  . Drug use: No  . Sexual activity: Not Currently  Other Topics Concern  . Not on file  Social History Narrative   Patient is right handed.   Patient drinks about 6-7 cups of caffeine per day.   Social Determinants of Health   Financial Resource Strain: Not on file  Food Insecurity: Not on file  Transportation Needs: Not on file  Physical Activity: Not on file  Stress: Not on file  Social Connections: Not on file  Intimate Partner Violence: Not on file      PHYSICAL EXAM  Vitals:   02/23/21 1324  BP: (!) 161/72  Pulse: 69  Weight: 278 lb (126.1 kg)  Height: 5\' 9"  (1.753 m)   Body mass index is 41.05 kg/m.  Generalized: Well developed, in no acute distress   Neurological examination  Mentation: Alert oriented to time, place, history taking. Follows all commands speech and language fluent Cranial nerve II-XII: Pupils were equal round reactive to light. Extraocular movements were full, visual field were full on confrontational test.  Head turning and shoulder shrug  were normal and symmetric. Motor: The  motor testing reveals 5 over 5 strength of all 4 extremities. Good symmetric motor tone is noted throughout.  Sensory: Sensory testing is intact to soft touch on all 4 extremities. No evidence of extinction is noted.  Coordination: Cerebellar testing reveals good finger-nose-finger and heel-to-shin bilaterally.  Gait and station: Gait is slightly unsteady.  Wide-based.  Tandem gait not attempted. Reflexes: Deep tendon reflexes are depressed throughout  DIAGNOSTIC DATA (LABS, IMAGING, TESTING) - I reviewed patient records, labs, notes, testing and imaging myself where available.  Lab Results  Component Value Date   WBC 4.9 08/13/2019   HGB 11.2 (L) 08/20/2019   HCT 33.7 (L) 08/20/2019   MCV 91.2 08/13/2019   PLT 196 08/13/2019      Component Value Date/Time   NA 139 08/13/2019 0846   K 4.4 08/13/2019 0846   CL 102 08/13/2019 0846   CO2 28 08/13/2019 0846   GLUCOSE 102 (H) 08/13/2019 0846   BUN 15 08/13/2019 0846   CREATININE 0.95 08/20/2019 0503   CALCIUM 9.3 08/13/2019 0846   PROT 6.8 12/21/2012 0940   ALBUMIN 3.6 12/21/2012 0940   AST 16 12/21/2012 0940   ALT 18 12/21/2012 0940   ALKPHOS 52 12/21/2012 0940   BILITOT 0.2 (L) 12/21/2012 0940   GFRNONAA >60 08/20/2019 0503   GFRAA >60 08/20/2019 0503   Lab Results  Component Value Date   CHOL 130 12/22/2012   HDL 26 (L) 12/22/2012   LDLCALC 63 12/22/2012   TRIG 204 (H) 12/22/2012   CHOLHDL 5.0 12/22/2012   Lab Results  Component Value Date   HGBA1C 5.5 08/13/2019   No results found for: VITAMINB12 Lab Results  Component Value Date   TSH 0.775 12/21/2012      ASSESSMENT AND PLAN 70 y.o. year old male  has a past medical history of Abnormality of gait (01/26/2015), Arthritis, Cancer ([place]), Carpal tunnel syndrome on both sides, Diabetes mellitus without complication ([place]), Dyslipidemia, GERD (gastroesophageal reflux disease), Glaucoma, History of kidney stones, Hypertension, Morbid obesity ([place]), Neuropathy,  Neuropathy, OSA on CPAP, Polyneuropathy in diabetes(357.2) (01/23/2014), Renal neoplasm, and TIA (transient ischemic attack). here with:  1.  Diabetic peripheral neuropathy  --Continue gabapentin 600 mg 3 times a day -- Continue Cymbalta 30 mg twice a day --  We discussed Qutenza treatment the patient is interested.  We will check to see if his insurance will cover it. -- He will follow-up in 1 year or sooner if needed  I spent 30 minutes of face-to-face and non-face-to-face time with patient.  This included previsit chart review and discussing new treatment Qutenza with the patient.  Ward Givens, MSN, NP-C 02/23/2021, 1:34 PM [facility] [address] [place], [place] [address] [phone]

## 2021-02-23 NOTE — Patient Instructions (Addendum)
Your Plan:  Continue cymbalta and gabapentin  Will see if Qutenza is approved for you  Thank you for coming to see [place] at [facility]. I hope we have been able to provide you high quality care today.  You may receive a patient satisfaction survey over the next few weeks. We would appreciate your feedback and comments so that we may continue to improve ourselves and the health of our patients.

## 2021-02-23 NOTE — Progress Notes (Signed)
I have read the note, and I agree with the clinical assessment and plan.  Pricilla Moehle K Nataki Mccrumb   

## 2021-03-09 DIAGNOSIS — I10 Essential (primary) hypertension: Secondary | ICD-10-CM | POA: Diagnosis not present

## 2021-03-09 DIAGNOSIS — K219 Gastro-esophageal reflux disease without esophagitis: Secondary | ICD-10-CM | POA: Diagnosis not present

## 2021-03-09 DIAGNOSIS — E1142 Type 2 diabetes mellitus with diabetic polyneuropathy: Secondary | ICD-10-CM | POA: Diagnosis not present

## 2021-03-09 DIAGNOSIS — C61 Malignant neoplasm of prostate: Secondary | ICD-10-CM | POA: Diagnosis not present

## 2021-03-09 DIAGNOSIS — H409 Unspecified glaucoma: Secondary | ICD-10-CM | POA: Diagnosis not present

## 2021-03-09 DIAGNOSIS — E1149 Type 2 diabetes mellitus with other diabetic neurological complication: Secondary | ICD-10-CM | POA: Diagnosis not present

## 2021-03-09 DIAGNOSIS — E7800 Pure hypercholesterolemia, unspecified: Secondary | ICD-10-CM | POA: Diagnosis not present

## 2021-03-09 DIAGNOSIS — M1990 Unspecified osteoarthritis, unspecified site: Secondary | ICD-10-CM | POA: Diagnosis not present

## 2021-03-09 DIAGNOSIS — C641 Malignant neoplasm of right kidney, except renal pelvis: Secondary | ICD-10-CM | POA: Diagnosis not present

## 2021-03-13 DIAGNOSIS — M25562 Pain in left knee: Secondary | ICD-10-CM | POA: Diagnosis not present

## 2021-03-13 DIAGNOSIS — M25561 Pain in right knee: Secondary | ICD-10-CM | POA: Diagnosis not present

## 2021-03-16 DIAGNOSIS — E1142 Type 2 diabetes mellitus with diabetic polyneuropathy: Secondary | ICD-10-CM | POA: Diagnosis not present

## 2021-03-16 DIAGNOSIS — Z86010 Personal history of colonic polyps: Secondary | ICD-10-CM | POA: Diagnosis not present

## 2021-03-16 DIAGNOSIS — G4733 Obstructive sleep apnea (adult) (pediatric): Secondary | ICD-10-CM | POA: Diagnosis not present

## 2021-03-16 DIAGNOSIS — I10 Essential (primary) hypertension: Secondary | ICD-10-CM | POA: Diagnosis not present

## 2021-03-16 DIAGNOSIS — Z8546 Personal history of malignant neoplasm of prostate: Secondary | ICD-10-CM | POA: Diagnosis not present

## 2021-03-16 DIAGNOSIS — Z6841 Body Mass Index (BMI) 40.0 and over, adult: Secondary | ICD-10-CM | POA: Diagnosis not present

## 2021-03-16 DIAGNOSIS — Z85528 Personal history of other malignant neoplasm of kidney: Secondary | ICD-10-CM | POA: Diagnosis not present

## 2021-03-16 DIAGNOSIS — Z1389 Encounter for screening for other disorder: Secondary | ICD-10-CM | POA: Diagnosis not present

## 2021-03-16 DIAGNOSIS — I700 Atherosclerosis of aorta: Secondary | ICD-10-CM | POA: Diagnosis not present

## 2021-03-16 DIAGNOSIS — Z8673 Personal history of transient ischemic attack (TIA), and cerebral infarction without residual deficits: Secondary | ICD-10-CM | POA: Diagnosis not present

## 2021-03-16 DIAGNOSIS — E1149 Type 2 diabetes mellitus with other diabetic neurological complication: Secondary | ICD-10-CM | POA: Diagnosis not present

## 2021-03-16 DIAGNOSIS — Z0000 Encounter for general adult medical examination without abnormal findings: Secondary | ICD-10-CM | POA: Diagnosis not present

## 2021-03-16 DIAGNOSIS — D692 Other nonthrombocytopenic purpura: Secondary | ICD-10-CM | POA: Diagnosis not present

## 2021-03-16 DIAGNOSIS — M549 Dorsalgia, unspecified: Secondary | ICD-10-CM | POA: Diagnosis not present

## 2021-03-25 DIAGNOSIS — M25562 Pain in left knee: Secondary | ICD-10-CM | POA: Diagnosis not present

## 2021-03-30 DIAGNOSIS — Z961 Presence of intraocular lens: Secondary | ICD-10-CM | POA: Diagnosis not present

## 2021-03-30 DIAGNOSIS — H401131 Primary open-angle glaucoma, bilateral, mild stage: Secondary | ICD-10-CM | POA: Diagnosis not present

## 2021-04-15 DIAGNOSIS — M1712 Unilateral primary osteoarthritis, left knee: Secondary | ICD-10-CM | POA: Diagnosis not present

## 2021-04-15 DIAGNOSIS — S83232A Complex tear of medial meniscus, current injury, left knee, initial encounter: Secondary | ICD-10-CM | POA: Diagnosis not present

## 2021-04-15 DIAGNOSIS — M948X6 Other specified disorders of cartilage, lower leg: Secondary | ICD-10-CM | POA: Diagnosis not present

## 2021-04-15 DIAGNOSIS — M23232 Derangement of other medial meniscus due to old tear or injury, left knee: Secondary | ICD-10-CM | POA: Diagnosis not present

## 2021-04-15 DIAGNOSIS — G8918 Other acute postprocedural pain: Secondary | ICD-10-CM | POA: Diagnosis not present

## 2021-05-12 DIAGNOSIS — Z96652 Presence of left artificial knee joint: Secondary | ICD-10-CM | POA: Diagnosis not present

## 2021-05-12 DIAGNOSIS — M25561 Pain in right knee: Secondary | ICD-10-CM | POA: Diagnosis not present

## 2021-05-12 DIAGNOSIS — Z471 Aftercare following joint replacement surgery: Secondary | ICD-10-CM | POA: Diagnosis not present

## 2021-05-13 DIAGNOSIS — R262 Difficulty in walking, not elsewhere classified: Secondary | ICD-10-CM | POA: Diagnosis not present

## 2021-05-13 DIAGNOSIS — M6281 Muscle weakness (generalized): Secondary | ICD-10-CM | POA: Diagnosis not present

## 2021-05-13 DIAGNOSIS — M25561 Pain in right knee: Secondary | ICD-10-CM | POA: Diagnosis not present

## 2021-05-13 DIAGNOSIS — R269 Unspecified abnormalities of gait and mobility: Secondary | ICD-10-CM | POA: Diagnosis not present

## 2021-05-17 DIAGNOSIS — R262 Difficulty in walking, not elsewhere classified: Secondary | ICD-10-CM | POA: Diagnosis not present

## 2021-05-17 DIAGNOSIS — M6281 Muscle weakness (generalized): Secondary | ICD-10-CM | POA: Diagnosis not present

## 2021-05-17 DIAGNOSIS — M25561 Pain in right knee: Secondary | ICD-10-CM | POA: Diagnosis not present

## 2021-05-17 DIAGNOSIS — R269 Unspecified abnormalities of gait and mobility: Secondary | ICD-10-CM | POA: Diagnosis not present

## 2021-05-30 DIAGNOSIS — C641 Malignant neoplasm of right kidney, except renal pelvis: Secondary | ICD-10-CM | POA: Diagnosis not present

## 2021-05-30 DIAGNOSIS — E1149 Type 2 diabetes mellitus with other diabetic neurological complication: Secondary | ICD-10-CM | POA: Diagnosis not present

## 2021-05-30 DIAGNOSIS — I10 Essential (primary) hypertension: Secondary | ICD-10-CM | POA: Diagnosis not present

## 2021-05-30 DIAGNOSIS — E1142 Type 2 diabetes mellitus with diabetic polyneuropathy: Secondary | ICD-10-CM | POA: Diagnosis not present

## 2021-05-30 DIAGNOSIS — K219 Gastro-esophageal reflux disease without esophagitis: Secondary | ICD-10-CM | POA: Diagnosis not present

## 2021-05-30 DIAGNOSIS — E7800 Pure hypercholesterolemia, unspecified: Secondary | ICD-10-CM | POA: Diagnosis not present

## 2021-05-30 DIAGNOSIS — H409 Unspecified glaucoma: Secondary | ICD-10-CM | POA: Diagnosis not present

## 2021-05-30 DIAGNOSIS — C61 Malignant neoplasm of prostate: Secondary | ICD-10-CM | POA: Diagnosis not present

## 2021-05-30 DIAGNOSIS — M1990 Unspecified osteoarthritis, unspecified site: Secondary | ICD-10-CM | POA: Diagnosis not present

## 2021-06-11 DIAGNOSIS — M1711 Unilateral primary osteoarthritis, right knee: Secondary | ICD-10-CM | POA: Diagnosis not present

## 2021-06-14 ENCOUNTER — Other Ambulatory Visit: Payer: Self-pay | Admitting: Orthopaedic Surgery

## 2021-06-14 DIAGNOSIS — M25561 Pain in right knee: Secondary | ICD-10-CM

## 2021-06-27 ENCOUNTER — Ambulatory Visit
Admission: RE | Admit: 2021-06-27 | Discharge: 2021-06-27 | Disposition: A | Discharge status: Home / Self Care | Payer: Medicare Other | Source: Ambulatory Visit | Attending: Orthopaedic Surgery | Admitting: Orthopaedic Surgery

## 2021-06-27 DIAGNOSIS — M25561 Pain in right knee: Secondary | ICD-10-CM

## 2021-06-30 DIAGNOSIS — H401131 Primary open-angle glaucoma, bilateral, mild stage: Secondary | ICD-10-CM | POA: Diagnosis not present

## 2021-07-02 DIAGNOSIS — M1711 Unilateral primary osteoarthritis, right knee: Secondary | ICD-10-CM | POA: Diagnosis not present

## 2021-07-14 DIAGNOSIS — C61 Malignant neoplasm of prostate: Secondary | ICD-10-CM | POA: Diagnosis not present

## 2021-07-21 DIAGNOSIS — C61 Malignant neoplasm of prostate: Secondary | ICD-10-CM | POA: Diagnosis not present

## 2021-07-21 DIAGNOSIS — C641 Malignant neoplasm of right kidney, except renal pelvis: Secondary | ICD-10-CM | POA: Diagnosis not present

## 2021-07-21 DIAGNOSIS — Z87442 Personal history of urinary calculi: Secondary | ICD-10-CM | POA: Diagnosis not present

## 2021-09-16 DIAGNOSIS — I10 Essential (primary) hypertension: Secondary | ICD-10-CM | POA: Diagnosis not present

## 2021-09-16 DIAGNOSIS — Z8673 Personal history of transient ischemic attack (TIA), and cerebral infarction without residual deficits: Secondary | ICD-10-CM | POA: Diagnosis not present

## 2021-09-16 DIAGNOSIS — E1149 Type 2 diabetes mellitus with other diabetic neurological complication: Secondary | ICD-10-CM | POA: Diagnosis not present

## 2021-09-16 DIAGNOSIS — Z85528 Personal history of other malignant neoplasm of kidney: Secondary | ICD-10-CM | POA: Diagnosis not present

## 2021-09-16 DIAGNOSIS — I700 Atherosclerosis of aorta: Secondary | ICD-10-CM | POA: Diagnosis not present

## 2021-09-16 DIAGNOSIS — Z23 Encounter for immunization: Secondary | ICD-10-CM | POA: Diagnosis not present

## 2021-09-16 DIAGNOSIS — Z8546 Personal history of malignant neoplasm of prostate: Secondary | ICD-10-CM | POA: Diagnosis not present

## 2021-09-16 DIAGNOSIS — E1142 Type 2 diabetes mellitus with diabetic polyneuropathy: Secondary | ICD-10-CM | POA: Diagnosis not present

## 2021-09-16 DIAGNOSIS — G4733 Obstructive sleep apnea (adult) (pediatric): Secondary | ICD-10-CM | POA: Diagnosis not present

## 2021-09-28 DIAGNOSIS — E1149 Type 2 diabetes mellitus with other diabetic neurological complication: Secondary | ICD-10-CM | POA: Diagnosis not present

## 2021-09-28 DIAGNOSIS — K219 Gastro-esophageal reflux disease without esophagitis: Secondary | ICD-10-CM | POA: Diagnosis not present

## 2021-09-28 DIAGNOSIS — E7800 Pure hypercholesterolemia, unspecified: Secondary | ICD-10-CM | POA: Diagnosis not present

## 2021-09-28 DIAGNOSIS — M1990 Unspecified osteoarthritis, unspecified site: Secondary | ICD-10-CM | POA: Diagnosis not present

## 2021-09-28 DIAGNOSIS — H409 Unspecified glaucoma: Secondary | ICD-10-CM | POA: Diagnosis not present

## 2021-09-28 DIAGNOSIS — E1142 Type 2 diabetes mellitus with diabetic polyneuropathy: Secondary | ICD-10-CM | POA: Diagnosis not present

## 2021-09-28 DIAGNOSIS — I10 Essential (primary) hypertension: Secondary | ICD-10-CM | POA: Diagnosis not present

## 2021-11-02 DIAGNOSIS — H401131 Primary open-angle glaucoma, bilateral, mild stage: Secondary | ICD-10-CM | POA: Diagnosis not present

## 2021-11-23 DIAGNOSIS — K219 Gastro-esophageal reflux disease without esophagitis: Secondary | ICD-10-CM | POA: Diagnosis not present

## 2021-11-23 DIAGNOSIS — I10 Essential (primary) hypertension: Secondary | ICD-10-CM | POA: Diagnosis not present

## 2021-11-23 DIAGNOSIS — E1149 Type 2 diabetes mellitus with other diabetic neurological complication: Secondary | ICD-10-CM | POA: Diagnosis not present

## 2021-11-23 DIAGNOSIS — E1142 Type 2 diabetes mellitus with diabetic polyneuropathy: Secondary | ICD-10-CM | POA: Diagnosis not present

## 2021-11-23 DIAGNOSIS — E7800 Pure hypercholesterolemia, unspecified: Secondary | ICD-10-CM | POA: Diagnosis not present

## 2022-02-01 DIAGNOSIS — N3000 Acute cystitis without hematuria: Secondary | ICD-10-CM | POA: Diagnosis not present

## 2022-02-01 DIAGNOSIS — Z6841 Body Mass Index (BMI) 40.0 and over, adult: Secondary | ICD-10-CM | POA: Diagnosis not present

## 2022-02-23 ENCOUNTER — Ambulatory Visit: Payer: Medicare HMO | Admitting: Adult Health

## 2022-03-03 DIAGNOSIS — Z961 Presence of intraocular lens: Secondary | ICD-10-CM | POA: Diagnosis not present

## 2022-03-03 DIAGNOSIS — H524 Presbyopia: Secondary | ICD-10-CM | POA: Diagnosis not present

## 2022-03-03 DIAGNOSIS — H52203 Unspecified astigmatism, bilateral: Secondary | ICD-10-CM | POA: Diagnosis not present

## 2022-03-03 DIAGNOSIS — H401131 Primary open-angle glaucoma, bilateral, mild stage: Secondary | ICD-10-CM | POA: Diagnosis not present

## 2022-03-03 DIAGNOSIS — H26491 Other secondary cataract, right eye: Secondary | ICD-10-CM | POA: Diagnosis not present

## 2022-03-03 DIAGNOSIS — E119 Type 2 diabetes mellitus without complications: Secondary | ICD-10-CM | POA: Diagnosis not present

## 2022-03-21 DIAGNOSIS — C61 Malignant neoplasm of prostate: Secondary | ICD-10-CM | POA: Diagnosis not present

## 2022-03-23 DIAGNOSIS — N281 Cyst of kidney, acquired: Secondary | ICD-10-CM | POA: Diagnosis not present

## 2022-03-23 DIAGNOSIS — K7689 Other specified diseases of liver: Secondary | ICD-10-CM | POA: Diagnosis not present

## 2022-03-23 DIAGNOSIS — C641 Malignant neoplasm of right kidney, except renal pelvis: Secondary | ICD-10-CM | POA: Diagnosis not present

## 2022-03-23 DIAGNOSIS — J8410 Pulmonary fibrosis, unspecified: Secondary | ICD-10-CM | POA: Diagnosis not present

## 2022-03-30 DIAGNOSIS — C641 Malignant neoplasm of right kidney, except renal pelvis: Secondary | ICD-10-CM | POA: Diagnosis not present

## 2022-03-30 DIAGNOSIS — N200 Calculus of kidney: Secondary | ICD-10-CM | POA: Diagnosis not present

## 2022-03-30 DIAGNOSIS — C61 Malignant neoplasm of prostate: Secondary | ICD-10-CM | POA: Diagnosis not present

## 2022-05-25 ENCOUNTER — Ambulatory Visit: Payer: Medicare HMO | Admitting: Adult Health

## 2022-05-31 ENCOUNTER — Encounter: Payer: Self-pay | Admitting: Adult Health

## 2022-05-31 ENCOUNTER — Ambulatory Visit: Payer: Medicare HMO | Admitting: Adult Health

## 2022-05-31 VITALS — BP 132/75 | HR 61 | Ht 68.0 in | Wt 281.2 lb

## 2022-05-31 DIAGNOSIS — E1142 Type 2 diabetes mellitus with diabetic polyneuropathy: Secondary | ICD-10-CM | POA: Diagnosis not present

## 2022-05-31 NOTE — Progress Notes (Signed)
PATIENT: Marvin Rivas DOB: Nov 14, 1950  REASON FOR VISIT: follow up HISTORY FROM: patient Primary neurologist: Dr. Jannifer Franklin  Chief Complaint  Patient presents with   Follow-up    Pt in 75  Pt here for Neuropathy f/u   Pt states  pain is the same . Pt states pain has not gotten worse      HISTORY OF PRESENT ILLNESS: Today 05/31/22: Marvin Rivas is a 71 year old male with a history of neuropathy. He returns today for follow-up. Reports that numbness was primarily in right foot and now he is feeling it in left foot. Burning and tingling pain in both feet that happen 2-3 times a day. Usually will last about 20 minutes. Most of the time he feels it at rest after he has been up on his feet. No changes with gait or balance. Reports that he fell in the back of the yard but slipped on pine needles on a bank. Uses a cane when ambulating.   02/23/21: Marvin Rivas is a 71 year old male with a history of diabetes and diabetic peripheral neuropathy.  He returns today for follow-up.  He states that he has noticed some benefit with Cymbalta especially if he does not take it.  He continues on gabapentin.  Denies any significant changes with his gait or balance.  Reports that he does not always use his cane and he notices that his balance is off if he does not have a cane.  States that he had a fall 2 months ago.  Reports that he has burning and tingling in the lower extremities constantly.  Reports numbness in the hands due to carpal tunnel syndrome.  He does wear braces at night.  He drives using hand controls.  He returns today for an evaluation.  HISTORY Marvin Rivas is a 71 year old right-handed white male with a history of diabetes in the past and a peripheral neuropathy.  The patient is on gabapentin at maximal doses taking 600 mg 4 times daily.  He has lost about 70 pounds of weight with a combination of renal and prostate cancer.  His appetite has now returned.  He is wondering whether he can come off CPAP  because of the weight loss.  He has numbness up to the knees, he is able to operate a motor vehicle using hand controls as he cannot feel his feet to drive.  He has been doing this for about 7 years.  The patient reports that his hands are numb, he was told he had carpal tunnel syndrome but has not wanted to have surgery for this.  The patient has some chronic back pain, he has had 3 prior lumbosacral spine surgeries.  He will stumble on occasion, he generally will use a cane outside of the house but not in the house.  He has no steps or stairs to climb inside the house.  He returns to the office today for an evaluation.  He has a lot of pain in the feet if he goes barefoot, he is still having some trouble sleeping at night.    REVIEW OF SYSTEMS: Out of a complete 14 system review of symptoms, the patient complains only of the following symptoms, and all other reviewed systems are negative.  See HPI  ALLERGIES: No Known Allergies  HOME MEDICATIONS: Outpatient Medications Prior to Visit  Medication Sig Dispense Refill   amLODipine (NORVASC) 5 MG tablet 1 tablet     aspirin 81 MG chewable tablet 1 tablet  atorvastatin (LIPITOR) 10 MG tablet Take 10 mg by mouth once a week. Sunday morning     Blood Glucose Calibration (TRUE METRIX LEVEL 1) Low SOLN See admin instructions.     DULoxetine (CYMBALTA) 30 MG capsule Take one pill twice a day 180 capsule 3   famotidine (PEPCID) 20 MG tablet Take 20 mg by mouth at bedtime.      gabapentin (NEURONTIN) 600 MG tablet Take 1 tablet (600 mg total) by mouth 4 (four) times daily. 360 tablet 3   losartan-hydrochlorothiazide (HYZAAR) 50-12.5 MG tablet Take 0.5 tablets daily by mouth.     meloxicam (MOBIC) 15 MG tablet TAKE 1 TABLET EVERY DAY FOR PAIN     OVER THE COUNTER MEDICATION Place 2 sprays into both nostrils as needed (for nasal congestion). Four Way Nasal Spray [employer] Brand     No facility-administered medications prior to visit.    PAST MEDICAL  HISTORY: Past Medical History:  Diagnosis Date   Abnormality of gait 01/26/2015   Arthritis    Cancer ([facility])    right kidney cancer and prostate cancer half of right kidney removed   Carpal tunnel syndrome on both sides    neuropathy in feet   Diabetes mellitus without complication ([place])    controlled with diet   Dyslipidemia    GERD (gastroesophageal reflux disease)    Glaucoma    pt states he had a 2nd opthalmologist opinion and was told he did not have glaucoma   History of kidney stones    Hypertension    Morbid obesity ([place])    Neuropathy    Neuropathy    legs   OSA on CPAP    uses CPAP   Polyneuropathy in diabetes(357.2) 01/23/2014   Renal neoplasm    TIA (transient ischemic attack)    ?2013    PAST SURGICAL HISTORY: Past Surgical History:  Procedure Laterality Date   ANTERIOR LAT LUMBAR FUSION N/A 07/11/2017   Procedure: L2-3 L3-4 Anteriolateral lumbar interbody fusion with posterior fixation and exploration/revision of previous fusion L4 to S1;  Surgeon: Erline Levine, MD;  Location: [facility];  Service: Neurosurgery;  Laterality: N/A;  L2-3 L3-4 Anteriolateral lumbar interbody fusion with posterior fixation and exploration/revision of previous fusion L4 to S1   BACK SURGERY     COLONOSCOPY     CYSTOSCOPY/URETEROSCOPY/HOLMIUM LASER/STENT PLACEMENT Left 06/20/2019   Procedure: CYSTOSCOPY/ RETROGRADE/URETEROSCOPY/HOLMIUM LASER/STENT PLACEMENT/ CYSTOLITHALOPAXY;  Surgeon: Raynelle Bring, MD;  Location: [facility];  Service: Urology;  Laterality: Left;   EXTRACORPOREAL SHOCK WAVE LITHOTRIPSY Right 08/27/2020   Procedure: EXTRACORPOREAL SHOCK WAVE LITHOTRIPSY (ESWL);  Surgeon: Raynelle Bring, MD;  Location: [facility];  Service: Urology;  Laterality: Right;   HOLMIUM LASER APPLICATION Left 03/28/4131   Procedure: HOLMIUM LASER APPLICATION;  Surgeon: Raynelle Bring, MD;  Location: [facility];  Service: Urology;  Laterality: Left;   LUMBAR PERCUTANEOUS PEDICLE SCREW 2 LEVEL  N/A 07/11/2017   Procedure: LUMBAR PERCUTANEOUS PEDICLE SCREW 2 LEVEL;  Surgeon: Erline Levine, MD;  Location: [facility];  Service: Neurosurgery;  Laterality: N/A;   LYMPHADENECTOMY Bilateral 08/19/2019   Procedure: LYMPHADENECTOMY, PELVIC;  Surgeon: Raynelle Bring, MD;  Location: [facility];  Service: Urology;  Laterality: Bilateral;   NASAL SEPTUM SURGERY     ROBOT ASSISTED LAPAROSCOPIC RADICAL PROSTATECTOMY N/A 08/19/2019   Procedure: XI ROBOTIC ASSISTED LAPAROSCOPIC RADICAL PROSTATECTOMY LEVEL 3;  Surgeon: Raynelle Bring, MD;  Location: [facility];  Service: Urology;  Laterality: N/A;   ROBOTIC ASSITED PARTIAL NEPHRECTOMY Right 09/14/2017  Procedure: ROBOTIC ASSITED PARTIAL NEPHRECTOMY;  Surgeon: Raynelle Bring, MD;  Location: [facility];  Service: Urology;  Laterality: Right;  NEEDS 210 MIN FOR PROCEDURE   SPINAL FUSION     toenail removed     both big toenails removed as a child    FAMILY HISTORY: Family History  Problem Relation Age of Onset   Hypertension Mother    Prostate cancer Father    Neuropathy Neg Hx     SOCIAL HISTORY: Social History   Socioeconomic History   Marital status: Married    Spouse name: Chiropodist   Number of children: 2   Years of education: 12   Highest education level: Not on file  Occupational History   Occupation: MANUFACTURING Lobbyist: [employer]  Tobacco Use   Smoking status: Former    Types: Cigarettes    Quit date: 01/24/1999    Years since quitting: 23.3   Smokeless tobacco: Never  Vaping Use   Vaping Use: Never used  Substance and Sexual Activity   Alcohol use: No   Drug use: No   Sexual activity: Not Currently  Other Topics Concern   Not on file  Social History Narrative   Patient is right handed.   Patient drinks about 6-7 cups of caffeine per day.   Social Determinants of Health   Financial Resource Strain: Not on file  Food Insecurity: Not on file  Transportation Needs: Not on file  Physical Activity: Not on file   Stress: Not on file  Social Connections: Not on file  Intimate Partner Violence: Not on file      PHYSICAL EXAM  Vitals:   05/31/22 0851  BP: 132/75  Pulse: 61  Weight: 281 lb 3.2 oz (127.6 kg)  Height: '5\' 8"'$  (1.727 m)   Body mass index is 42.76 kg/m.  Generalized: Well developed, in no acute distress   Neurological examination  Mentation: Alert oriented to time, place, history taking. Follows all commands speech and language fluent Cranial nerve II-XII: Pupils were equal round reactive to light. Extraocular movements were full, visual field were full on confrontational test.  Head turning and shoulder shrug  were normal and symmetric. Motor: The motor testing reveals 5 over 5 strength of all 4 extremities. Good symmetric motor tone is noted throughout.  Sensory: Sensory testing is intact to soft touch on all 4 extremities. No evidence of extinction is noted.  Coordination: Cerebellar testing reveals good finger-nose-finger and heel-to-shin bilaterally.  Gait and station: Uses a cane when ambulating.  Tandem gait not attempted  DIAGNOSTIC DATA (LABS, IMAGING, TESTING) - I reviewed patient records, labs, notes, testing and imaging myself where available.  Lab Results  Component Value Date   WBC 4.9 08/13/2019   HGB 11.2 (L) 08/20/2019   HCT 33.7 (L) 08/20/2019   MCV 91.2 08/13/2019   PLT 196 08/13/2019      Component Value Date/Time   NA 139 08/13/2019 0846   K 4.4 08/13/2019 0846   CL 102 08/13/2019 0846   CO2 28 08/13/2019 0846   GLUCOSE 102 (H) 08/13/2019 0846   BUN 15 08/13/2019 0846   CREATININE 0.95 08/20/2019 0503   CALCIUM 9.3 08/13/2019 0846   PROT 6.8 12/21/2012 0940   ALBUMIN 3.6 12/21/2012 0940   AST 16 12/21/2012 0940   ALT 18 12/21/2012 0940   ALKPHOS 52 12/21/2012 0940   BILITOT 0.2 (L) 12/21/2012 0940   GFRNONAA >60 08/20/2019 0503   GFRAA >60 08/20/2019 0503  Lab Results  Component Value Date   CHOL 130 12/22/2012   HDL 26 (L)  12/22/2012   LDLCALC 63 12/22/2012   TRIG 204 (H) 12/22/2012   CHOLHDL 5.0 12/22/2012   Lab Results  Component Value Date   HGBA1C 5.5 08/13/2019   No results found for: "VITAMINB12" Lab Results  Component Value Date   TSH 0.775 12/21/2012      ASSESSMENT AND PLAN 71 y.o. year old male  has a past medical history of Abnormality of gait (01/26/2015), Arthritis, Cancer ([place]), Carpal tunnel syndrome on both sides, Diabetes mellitus without complication ([place]), Dyslipidemia, GERD (gastroesophageal reflux disease), Glaucoma, History of kidney stones, Hypertension, Morbid obesity ([place]), Neuropathy, Neuropathy, OSA on CPAP, Polyneuropathy in diabetes(357.2) (01/23/2014), Renal neoplasm, and TIA (transient ischemic attack). here with:  1.  Diabetic peripheral neuropathy  --Continue gabapentin 600 mg 3 times a day -- Continue Cymbalta 30 mg twice a day --PCP is prescribing his medication.  Advised the patient that he can follow-up with [place] on a yearly basis or as needed.  Ward Givens, MSN, NP-C 05/31/2022, 9:15 AM [facility] [address], [place], [place] [address] [phone]

## 2022-08-02 DIAGNOSIS — Z8673 Personal history of transient ischemic attack (TIA), and cerebral infarction without residual deficits: Secondary | ICD-10-CM | POA: Diagnosis not present

## 2022-08-02 DIAGNOSIS — Z1331 Encounter for screening for depression: Secondary | ICD-10-CM | POA: Diagnosis not present

## 2022-08-02 DIAGNOSIS — Z23 Encounter for immunization: Secondary | ICD-10-CM | POA: Diagnosis not present

## 2022-08-02 DIAGNOSIS — I10 Essential (primary) hypertension: Secondary | ICD-10-CM | POA: Diagnosis not present

## 2022-08-02 DIAGNOSIS — E1149 Type 2 diabetes mellitus with other diabetic neurological complication: Secondary | ICD-10-CM | POA: Diagnosis not present

## 2022-08-02 DIAGNOSIS — G4733 Obstructive sleep apnea (adult) (pediatric): Secondary | ICD-10-CM | POA: Diagnosis not present

## 2022-08-02 DIAGNOSIS — I700 Atherosclerosis of aorta: Secondary | ICD-10-CM | POA: Diagnosis not present

## 2022-08-02 DIAGNOSIS — Z0000 Encounter for general adult medical examination without abnormal findings: Secondary | ICD-10-CM | POA: Diagnosis not present

## 2022-08-02 DIAGNOSIS — E1142 Type 2 diabetes mellitus with diabetic polyneuropathy: Secondary | ICD-10-CM | POA: Diagnosis not present

## 2022-08-02 DIAGNOSIS — Z6841 Body Mass Index (BMI) 40.0 and over, adult: Secondary | ICD-10-CM | POA: Diagnosis not present

## 2022-09-26 DIAGNOSIS — C61 Malignant neoplasm of prostate: Secondary | ICD-10-CM | POA: Diagnosis not present

## 2022-11-11 DIAGNOSIS — M1711 Unilateral primary osteoarthritis, right knee: Secondary | ICD-10-CM | POA: Diagnosis not present

## 2022-11-11 DIAGNOSIS — M25561 Pain in right knee: Secondary | ICD-10-CM | POA: Diagnosis not present

## 2023-01-23 DIAGNOSIS — E1142 Type 2 diabetes mellitus with diabetic polyneuropathy: Secondary | ICD-10-CM | POA: Diagnosis not present

## 2023-01-23 DIAGNOSIS — E1139 Type 2 diabetes mellitus with other diabetic ophthalmic complication: Secondary | ICD-10-CM | POA: Diagnosis not present

## 2023-01-23 DIAGNOSIS — G4733 Obstructive sleep apnea (adult) (pediatric): Secondary | ICD-10-CM | POA: Diagnosis not present

## 2023-01-23 DIAGNOSIS — I700 Atherosclerosis of aorta: Secondary | ICD-10-CM | POA: Diagnosis not present

## 2023-01-23 DIAGNOSIS — Z8673 Personal history of transient ischemic attack (TIA), and cerebral infarction without residual deficits: Secondary | ICD-10-CM | POA: Diagnosis not present

## 2023-01-23 DIAGNOSIS — E7800 Pure hypercholesterolemia, unspecified: Secondary | ICD-10-CM | POA: Diagnosis not present

## 2023-01-23 DIAGNOSIS — Z6841 Body Mass Index (BMI) 40.0 and over, adult: Secondary | ICD-10-CM | POA: Diagnosis not present

## 2023-01-23 DIAGNOSIS — E1149 Type 2 diabetes mellitus with other diabetic neurological complication: Secondary | ICD-10-CM | POA: Diagnosis not present

## 2023-02-23 DIAGNOSIS — I700 Atherosclerosis of aorta: Secondary | ICD-10-CM | POA: Diagnosis not present

## 2023-02-23 DIAGNOSIS — I10 Essential (primary) hypertension: Secondary | ICD-10-CM | POA: Diagnosis not present

## 2023-02-23 DIAGNOSIS — Z85528 Personal history of other malignant neoplasm of kidney: Secondary | ICD-10-CM | POA: Diagnosis not present

## 2023-02-23 DIAGNOSIS — Z8673 Personal history of transient ischemic attack (TIA), and cerebral infarction without residual deficits: Secondary | ICD-10-CM | POA: Diagnosis not present

## 2023-02-23 DIAGNOSIS — K219 Gastro-esophageal reflux disease without esophagitis: Secondary | ICD-10-CM | POA: Diagnosis not present

## 2023-02-23 DIAGNOSIS — H409 Unspecified glaucoma: Secondary | ICD-10-CM | POA: Diagnosis not present

## 2023-02-23 DIAGNOSIS — E7800 Pure hypercholesterolemia, unspecified: Secondary | ICD-10-CM | POA: Diagnosis not present

## 2023-02-23 DIAGNOSIS — E1142 Type 2 diabetes mellitus with diabetic polyneuropathy: Secondary | ICD-10-CM | POA: Diagnosis not present

## 2023-03-15 DIAGNOSIS — H52203 Unspecified astigmatism, bilateral: Secondary | ICD-10-CM | POA: Diagnosis not present

## 2023-03-15 DIAGNOSIS — H401131 Primary open-angle glaucoma, bilateral, mild stage: Secondary | ICD-10-CM | POA: Diagnosis not present

## 2023-03-15 DIAGNOSIS — H524 Presbyopia: Secondary | ICD-10-CM | POA: Diagnosis not present

## 2023-03-15 DIAGNOSIS — E119 Type 2 diabetes mellitus without complications: Secondary | ICD-10-CM | POA: Diagnosis not present

## 2023-03-15 DIAGNOSIS — Z961 Presence of intraocular lens: Secondary | ICD-10-CM | POA: Diagnosis not present

## 2023-03-15 DIAGNOSIS — H5213 Myopia, bilateral: Secondary | ICD-10-CM | POA: Diagnosis not present

## 2023-03-23 DIAGNOSIS — C61 Malignant neoplasm of prostate: Secondary | ICD-10-CM | POA: Diagnosis not present

## 2023-03-28 DIAGNOSIS — C61 Malignant neoplasm of prostate: Secondary | ICD-10-CM | POA: Diagnosis not present

## 2023-03-28 DIAGNOSIS — C649 Malignant neoplasm of unspecified kidney, except renal pelvis: Secondary | ICD-10-CM | POA: Diagnosis not present

## 2023-03-28 DIAGNOSIS — C641 Malignant neoplasm of right kidney, except renal pelvis: Secondary | ICD-10-CM | POA: Diagnosis not present

## 2023-03-31 DIAGNOSIS — C61 Malignant neoplasm of prostate: Secondary | ICD-10-CM | POA: Diagnosis not present

## 2023-03-31 DIAGNOSIS — C641 Malignant neoplasm of right kidney, except renal pelvis: Secondary | ICD-10-CM | POA: Diagnosis not present

## 2023-04-28 DIAGNOSIS — Z6841 Body Mass Index (BMI) 40.0 and over, adult: Secondary | ICD-10-CM | POA: Diagnosis not present

## 2023-04-28 DIAGNOSIS — E1140 Type 2 diabetes mellitus with diabetic neuropathy, unspecified: Secondary | ICD-10-CM | POA: Diagnosis not present

## 2023-04-28 DIAGNOSIS — E1149 Type 2 diabetes mellitus with other diabetic neurological complication: Secondary | ICD-10-CM | POA: Diagnosis not present

## 2023-04-28 DIAGNOSIS — Z9989 Dependence on other enabling machines and devices: Secondary | ICD-10-CM | POA: Diagnosis not present

## 2023-06-01 DIAGNOSIS — E1140 Type 2 diabetes mellitus with diabetic neuropathy, unspecified: Secondary | ICD-10-CM | POA: Diagnosis not present

## 2023-06-01 DIAGNOSIS — Z6841 Body Mass Index (BMI) 40.0 and over, adult: Secondary | ICD-10-CM | POA: Diagnosis not present

## 2023-06-16 DIAGNOSIS — H26491 Other secondary cataract, right eye: Secondary | ICD-10-CM | POA: Diagnosis not present

## 2023-06-16 DIAGNOSIS — H401131 Primary open-angle glaucoma, bilateral, mild stage: Secondary | ICD-10-CM | POA: Diagnosis not present

## 2023-07-10 DIAGNOSIS — H26491 Other secondary cataract, right eye: Secondary | ICD-10-CM | POA: Diagnosis not present

## 2023-07-18 DIAGNOSIS — H524 Presbyopia: Secondary | ICD-10-CM | POA: Diagnosis not present

## 2023-09-12 DIAGNOSIS — I10 Essential (primary) hypertension: Secondary | ICD-10-CM | POA: Diagnosis not present

## 2023-09-12 DIAGNOSIS — Z23 Encounter for immunization: Secondary | ICD-10-CM | POA: Diagnosis not present

## 2023-09-12 DIAGNOSIS — E1149 Type 2 diabetes mellitus with other diabetic neurological complication: Secondary | ICD-10-CM | POA: Diagnosis not present

## 2023-09-12 DIAGNOSIS — E7800 Pure hypercholesterolemia, unspecified: Secondary | ICD-10-CM | POA: Diagnosis not present

## 2023-09-12 DIAGNOSIS — Z0000 Encounter for general adult medical examination without abnormal findings: Secondary | ICD-10-CM | POA: Diagnosis not present

## 2023-09-12 DIAGNOSIS — E1142 Type 2 diabetes mellitus with diabetic polyneuropathy: Secondary | ICD-10-CM | POA: Diagnosis not present

## 2023-09-12 DIAGNOSIS — E1139 Type 2 diabetes mellitus with other diabetic ophthalmic complication: Secondary | ICD-10-CM | POA: Diagnosis not present

## 2023-09-12 DIAGNOSIS — I700 Atherosclerosis of aorta: Secondary | ICD-10-CM | POA: Diagnosis not present

## 2023-09-14 ENCOUNTER — Other Ambulatory Visit: Payer: Self-pay | Admitting: Family Medicine

## 2023-09-14 DIAGNOSIS — Z136 Encounter for screening for cardiovascular disorders: Secondary | ICD-10-CM

## 2023-09-14 DIAGNOSIS — Z87891 Personal history of nicotine dependence: Secondary | ICD-10-CM

## 2023-09-15 ENCOUNTER — Ambulatory Visit
Admission: RE | Admit: 2023-09-15 | Discharge: 2023-09-15 | Disposition: A | Discharge status: Home / Self Care | Payer: Medicare HMO | Source: Ambulatory Visit | Attending: Family Medicine | Admitting: Family Medicine

## 2023-09-15 DIAGNOSIS — N400 Enlarged prostate without lower urinary tract symptoms: Secondary | ICD-10-CM | POA: Diagnosis not present

## 2023-09-15 DIAGNOSIS — I10 Essential (primary) hypertension: Secondary | ICD-10-CM | POA: Diagnosis not present

## 2023-09-15 DIAGNOSIS — Z87891 Personal history of nicotine dependence: Secondary | ICD-10-CM | POA: Diagnosis not present

## 2023-09-15 DIAGNOSIS — E119 Type 2 diabetes mellitus without complications: Secondary | ICD-10-CM | POA: Diagnosis not present

## 2023-09-15 DIAGNOSIS — Z136 Encounter for screening for cardiovascular disorders: Secondary | ICD-10-CM

## 2023-09-19 DIAGNOSIS — H903 Sensorineural hearing loss, bilateral: Secondary | ICD-10-CM | POA: Diagnosis not present

## 2023-10-11 DIAGNOSIS — H9313 Tinnitus, bilateral: Secondary | ICD-10-CM | POA: Diagnosis not present

## 2023-10-11 DIAGNOSIS — J310 Chronic rhinitis: Secondary | ICD-10-CM | POA: Diagnosis not present

## 2023-10-11 DIAGNOSIS — T485X5A Adverse effect of other anti-common-cold drugs, initial encounter: Secondary | ICD-10-CM | POA: Diagnosis not present

## 2023-10-11 DIAGNOSIS — H903 Sensorineural hearing loss, bilateral: Secondary | ICD-10-CM | POA: Diagnosis not present

## 2023-10-11 DIAGNOSIS — H6992 Unspecified Eustachian tube disorder, left ear: Secondary | ICD-10-CM | POA: Diagnosis not present

## 2023-11-17 DIAGNOSIS — H401131 Primary open-angle glaucoma, bilateral, mild stage: Secondary | ICD-10-CM | POA: Diagnosis not present

## 2023-11-17 DIAGNOSIS — Z961 Presence of intraocular lens: Secondary | ICD-10-CM | POA: Diagnosis not present

## 2024-02-01 DIAGNOSIS — D128 Benign neoplasm of rectum: Secondary | ICD-10-CM | POA: Diagnosis not present

## 2024-02-01 DIAGNOSIS — D123 Benign neoplasm of transverse colon: Secondary | ICD-10-CM | POA: Diagnosis not present

## 2024-02-01 DIAGNOSIS — D125 Benign neoplasm of sigmoid colon: Secondary | ICD-10-CM | POA: Diagnosis not present

## 2024-02-01 DIAGNOSIS — Z860101 Personal history of adenomatous and serrated colon polyps: Secondary | ICD-10-CM | POA: Diagnosis not present

## 2024-02-01 DIAGNOSIS — Z09 Encounter for follow-up examination after completed treatment for conditions other than malignant neoplasm: Secondary | ICD-10-CM | POA: Diagnosis not present

## 2024-02-05 DIAGNOSIS — D125 Benign neoplasm of sigmoid colon: Secondary | ICD-10-CM | POA: Diagnosis not present

## 2024-02-05 DIAGNOSIS — D128 Benign neoplasm of rectum: Secondary | ICD-10-CM | POA: Diagnosis not present

## 2024-02-05 DIAGNOSIS — D123 Benign neoplasm of transverse colon: Secondary | ICD-10-CM | POA: Diagnosis not present

## 2024-03-11 DIAGNOSIS — W19XXXA Unspecified fall, initial encounter: Secondary | ICD-10-CM | POA: Diagnosis not present

## 2024-03-11 DIAGNOSIS — M549 Dorsalgia, unspecified: Secondary | ICD-10-CM | POA: Diagnosis not present

## 2024-03-11 DIAGNOSIS — K219 Gastro-esophageal reflux disease without esophagitis: Secondary | ICD-10-CM | POA: Diagnosis not present

## 2024-03-11 DIAGNOSIS — E1149 Type 2 diabetes mellitus with other diabetic neurological complication: Secondary | ICD-10-CM | POA: Diagnosis not present

## 2024-03-11 DIAGNOSIS — H409 Unspecified glaucoma: Secondary | ICD-10-CM | POA: Diagnosis not present

## 2024-03-11 DIAGNOSIS — I10 Essential (primary) hypertension: Secondary | ICD-10-CM | POA: Diagnosis not present

## 2024-03-11 DIAGNOSIS — E1142 Type 2 diabetes mellitus with diabetic polyneuropathy: Secondary | ICD-10-CM | POA: Diagnosis not present

## 2024-03-11 DIAGNOSIS — E7800 Pure hypercholesterolemia, unspecified: Secondary | ICD-10-CM | POA: Diagnosis not present

## 2024-03-18 ENCOUNTER — Other Ambulatory Visit: Payer: Self-pay

## 2024-03-18 ENCOUNTER — Emergency Department (HOSPITAL_BASED_OUTPATIENT_CLINIC_OR_DEPARTMENT_OTHER)

## 2024-03-18 ENCOUNTER — Emergency Department (HOSPITAL_BASED_OUTPATIENT_CLINIC_OR_DEPARTMENT_OTHER)
Admission: EM | Admit: 2024-03-18 | Discharge: 2024-03-18 | Disposition: A | Discharge status: Home / Self Care | Attending: Emergency Medicine | Admitting: Emergency Medicine

## 2024-03-18 DIAGNOSIS — Z7982 Long term (current) use of aspirin: Secondary | ICD-10-CM | POA: Diagnosis not present

## 2024-03-18 DIAGNOSIS — M7989 Other specified soft tissue disorders: Secondary | ICD-10-CM | POA: Diagnosis not present

## 2024-03-18 DIAGNOSIS — E119 Type 2 diabetes mellitus without complications: Secondary | ICD-10-CM | POA: Diagnosis not present

## 2024-03-18 DIAGNOSIS — S99921A Unspecified injury of right foot, initial encounter: Secondary | ICD-10-CM | POA: Diagnosis not present

## 2024-03-18 DIAGNOSIS — H04123 Dry eye syndrome of bilateral lacrimal glands: Secondary | ICD-10-CM | POA: Diagnosis not present

## 2024-03-18 DIAGNOSIS — Z79899 Other long term (current) drug therapy: Secondary | ICD-10-CM | POA: Diagnosis not present

## 2024-03-18 DIAGNOSIS — S8991XD Unspecified injury of right lower leg, subsequent encounter: Secondary | ICD-10-CM | POA: Diagnosis not present

## 2024-03-18 DIAGNOSIS — I10 Essential (primary) hypertension: Secondary | ICD-10-CM | POA: Insufficient documentation

## 2024-03-18 DIAGNOSIS — S99911A Unspecified injury of right ankle, initial encounter: Secondary | ICD-10-CM | POA: Diagnosis not present

## 2024-03-18 DIAGNOSIS — M79604 Pain in right leg: Secondary | ICD-10-CM | POA: Diagnosis not present

## 2024-03-18 DIAGNOSIS — L539 Erythematous condition, unspecified: Secondary | ICD-10-CM | POA: Diagnosis not present

## 2024-03-18 DIAGNOSIS — W010XXA Fall on same level from slipping, tripping and stumbling without subsequent striking against object, initial encounter: Secondary | ICD-10-CM | POA: Diagnosis not present

## 2024-03-18 DIAGNOSIS — L03115 Cellulitis of right lower limb: Secondary | ICD-10-CM

## 2024-03-18 DIAGNOSIS — H401131 Primary open-angle glaucoma, bilateral, mild stage: Secondary | ICD-10-CM | POA: Diagnosis not present

## 2024-03-18 LAB — COMPREHENSIVE METABOLIC PANEL WITH GFR
ALT: 20 U/L (ref 0–44)
AST: 25 U/L (ref 15–41)
Albumin: 4.1 g/dL (ref 3.5–5.0)
Alkaline Phosphatase: 97 U/L (ref 38–126)
Anion gap: 12 (ref 5–15)
BUN: 26 mg/dL — ABNORMAL HIGH (ref 8–23)
CO2: 26 mmol/L (ref 22–32)
Calcium: 9.7 mg/dL (ref 8.9–10.3)
Chloride: 103 mmol/L (ref 98–111)
Creatinine, Ser: 1.02 mg/dL (ref 0.61–1.24)
GFR, Estimated: >60 mL/min (ref >60)
Glucose, Bld: 111 mg/dL — ABNORMAL HIGH (ref 70–99)
Potassium: 3.5 mmol/L (ref 3.5–5.1)
Sodium: 141 mmol/L (ref 135–145)
Total Bilirubin: 0.6 mg/dL (ref 0.0–1.2)
Total Protein: 7.1 g/dL (ref 6.5–8.1)

## 2024-03-18 LAB — CBC WITH DIFFERENTIAL/PLATELET
Abs Immature Granulocytes: 0.01 10*3/uL (ref 0.00–0.07)
Basophils Absolute: 0 10*3/uL (ref 0.0–0.1)
Basophils Relative: 1 %
Eosinophils Absolute: 0.3 10*3/uL (ref 0.0–0.5)
Eosinophils Relative: 6 %
HCT: 38.4 % — ABNORMAL LOW (ref 39.0–52.0)
Hemoglobin: 13.2 g/dL (ref 13.0–17.0)
Immature Granulocytes: 0 %
Lymphocytes Relative: 22 %
Lymphs Abs: 1.4 10*3/uL (ref 0.7–4.0)
MCH: 32 pg (ref 26.0–34.0)
MCHC: 34.4 g/dL (ref 30.0–36.0)
MCV: 93 fL (ref 80.0–100.0)
Monocytes Absolute: 0.6 10*3/uL (ref 0.1–1.0)
Monocytes Relative: 10 %
Neutro Abs: 3.9 10*3/uL (ref 1.7–7.7)
Neutrophils Relative %: 61 %
Platelets: 246 10*3/uL (ref 150–400)
RBC: 4.13 MIL/uL — ABNORMAL LOW (ref 4.22–5.81)
RDW: 13.2 % (ref 11.5–15.5)
WBC: 6.2 10*3/uL (ref 4.0–10.5)
nRBC: 0 % (ref 0.0–0.2)

## 2024-03-18 LAB — LACTIC ACID, PLASMA: Lactic Acid, Venous: 1.2 mmol/L (ref 0.5–1.9)

## 2024-03-18 NOTE — Discharge Instructions (Addendum)
 Your ultrasound did not show any blood clot in your leg.  Your blood counts and electrolytes are normal today.  Please continue on the cephalexin as prescribed by your PCP  Return to the ER for any numbness or tingling in your right leg, increased pain, any other new or concerning symptoms

## 2024-03-18 NOTE — ED Triage Notes (Signed)
 Fell 9 days ago and cut right leg. Appears to now be infected. D-dimer 2.1- sent for follow-up. Denies CP SOB. Afebrile.

## 2024-03-18 NOTE — ED Notes (Signed)
 Reviewed AVS/discharge instruction with patient. Time allotted for and all questions answered. Patient is agreeable for d/c and escorted to ed exit by staff.

## 2024-03-18 NOTE — ED Provider Notes (Signed)
 [place] EMERGENCY DEPARTMENT AT [facility] Provider Note   CSN: [id number] Arrival date & time: 03/18/24  1416     History  Chief Complaint  Patient presents with   Leg Injury    Marvin Rivas is a 73 y.o. male with history of diabetes, hypertension, presents with concern for swelling and redness to his right calf.  States he tripped and fell 9 days ago, sustaining an abrasion to his right calf. Denies hitting his head or any LOC. Since then, the area has been red and swollen.  He was seen by his PCP earlier today who prescribed him a course of cephalexin for cellulitis.  They took a D-dimer which was elevated and recommended he come here for ultrasound to rule out DVT.  HPI     Home Medications Prior to Admission medications   Medication Sig Start Date End Date Taking? Authorizing Provider  amLODipine  (NORVASC ) 5 MG tablet 1 tablet 12/31/21   [provider]  aspirin  81 MG chewable tablet 1 tablet    [provider]  atorvastatin  (LIPITOR) 10 MG tablet Take 10 mg by mouth once a week. Sunday morning 06/11/19   [provider]  Blood Glucose Calibration (TRUE METRIX LEVEL 1) Low SOLN See admin instructions. 11/06/20   [provider]  DULoxetine  (CYMBALTA ) 30 MG capsule Take one pill twice a day 01/30/20   Brian Campanile, MD  famotidine  (PEPCID ) 20 MG tablet Take 20 mg by mouth at bedtime.     [provider]  gabapentin  (NEURONTIN ) 600 MG tablet Take 1 tablet (600 mg total) by mouth 4 (four) times daily. 12/16/19   Brian Campanile, MD  losartan -hydrochlorothiazide  (HYZAAR) 50-12.5 MG tablet Take 0.5 tablets daily by mouth.    [provider]  meloxicam (MOBIC) 15 MG tablet TAKE 1 TABLET EVERY DAY FOR PAIN    [provider]  OVER THE COUNTER MEDICATION Place 2 sprays into both nostrils as needed (for nasal congestion). [address] Nasal Spray [employer] Brand    [provider]      Allergies     Patient has no known allergies.    Review of Systems   Review of Systems  Skin:  Positive for wound.    Physical Exam Updated Vital Signs BP 129/70   Pulse 71   Temp 98.1 F (36.7 C)   Resp 17   SpO2 100%  Physical Exam Vitals and nursing note reviewed.  Constitutional:      Appearance: Normal appearance.     Comments: Ambulates with cane, at baseline  HENT:     Head: Atraumatic.  Cardiovascular:     Comments: 1+ pedal pulses Pulmonary:     Effort: Pulmonary effort is normal.  Musculoskeletal:     Comments: Right lower extremity:  General Ecchymosis and edema of the right calf. Abrasion noted to medial side of calf with surrounding area of erythema approximately 3 cm in diameter. No pus drainage. No obvious deformity.   Palpation Non-tender to the calf Non tender over the femur Nontender along the tibia and fibula, patella Nontender on the lateral and medial malleolus Non-tender of the popliteal fossa  ROM Full knee flexion and extension, full ankle flexion and extension   Sensation: Sensation intact throughout the lower extremity  Strength: 5/5 strength with resisted knee flexion and extension  5/5 strength with resisted ankle plantarflexion and dorsiflexion   Neurological:     General: No focal deficit present.     Mental  Status: He is alert.  Psychiatric:        Mood and Affect: Mood normal.        Behavior: Behavior normal.     ED Results / Procedures / Treatments   Labs (all labs ordered are listed, but only abnormal results are displayed) Labs Reviewed  COMPREHENSIVE METABOLIC PANEL WITH GFR - Abnormal; Notable for the following components:      Result Value   Glucose, Bld 111 (*)    BUN 26 (*)    All other components within normal limits  CBC WITH DIFFERENTIAL/PLATELET - Abnormal; Notable for the following components:   RBC 4.13 (*)    HCT 38.4 (*)    All other components within normal limits  LACTIC ACID, PLASMA     EKG None  Radiology US  Venous Img Lower Unilateral Right Result Date: 03/18/2024 CLINICAL DATA:  Right lower extremity pain, swelling and erythema. Fall 9 days ago with injury and bruising to calf. EXAM: RIGHT LOWER EXTREMITY VENOUS DOPPLER ULTRASOUND TECHNIQUE: Gray-scale sonography with graded compression, as well as color Doppler and duplex ultrasound were performed to evaluate the lower extremity deep venous systems from the level of the common femoral vein and including the common femoral, femoral, profunda femoral, popliteal and calf veins including the posterior tibial, peroneal and gastrocnemius veins when visible. The superficial great saphenous vein was also interrogated. Spectral Doppler was utilized to evaluate flow at rest and with distal augmentation maneuvers in the common femoral, femoral and popliteal veins. COMPARISON:  None Available. FINDINGS: Contralateral Common Femoral Vein: Respiratory phasicity is normal and symmetric with the symptomatic side. No evidence of thrombus. Normal compressibility. Common Femoral Vein: No evidence of thrombus. Normal compressibility, respiratory phasicity and response to augmentation. Saphenofemoral Junction: No evidence of thrombus. Normal compressibility and flow on color Doppler imaging. Profunda Femoral Vein: No evidence of thrombus. Normal compressibility and flow on color Doppler imaging. Femoral Vein: No evidence of thrombus. Normal compressibility, respiratory phasicity and response to augmentation. Popliteal Vein: No evidence of thrombus. Normal compressibility, respiratory phasicity and response to augmentation. Calf Veins: No evidence of thrombus. Normal compressibility and flow on color Doppler imaging. Superficial Great Saphenous Vein: No evidence of thrombus. Normal compressibility. Venous Reflux:  None. Other Findings: No evidence of superficial thrombophlebitis. Elongated subcutaneous complex fluid collection of the medial distal calf  measures up to approximately 10 x 2 x 3 cm and most likely represents hematoma. IMPRESSION: 1. No evidence of right lower extremity deep venous thrombosis. 2. Elongated subcutaneous complex fluid collection of the medial distal calf measuring up to 10 x 2 x 3 cm and most likely represents hematoma. Electronically Signed   By: Erica Hau M.D.   On: 03/18/2024 16:52    Procedures Procedures    Medications Ordered in ED Medications - No data to display  ED Course/ Medical Decision Making/ A&P                                 Medical Decision Making Amount and/or Complexity of Data Reviewed Labs: ordered.     Differential diagnosis includes but is not limited to fracture, dislocation, abscess, hematoma, DVT  ED Course:  Upon initial evaluation, patient is well-appearing, stable vitals.  Has a abrasion to the medial side of the right calf with surrounding erythema.  No pus drainage.  No areas of fluctuance or abscess noted.  Calf soft and nontender to palpation.  He has  full range of motion of the bilateral lower extremities.  Neurovascularly intact in the bilateral lower extremities.  Able to ambulate without difficulty.  No concern for  fracture or dislocation.  D-dimer was taken by PCP which reportedly was elevated, but ultrasound here does not show any evidence of DVT in the right lower extremity.  He was already started on cephalexin today for cellulitis.  Feel he can continue with this at home for treatment of his right calf cellulitis.  Stable appropriate for discharge home  Labs Ordered: I Ordered, and personally interpreted labs.  The pertinent results include:    CBC and CMP unremarkable  Imaging Studies ordered: I ordered imaging studies including ultrasound right lower extremity I independently visualized the imaging with scope of interpretation limited to determining acute life threatening conditions related to emergency care. Imaging showed  Negative for DVT.  Fluid  collection along the medial calf thought to be hematoma I agree with the radiologist interpretation   Impression: Cellulitis of right calf  Disposition:  The patient was discharged home with instructions to continue taking the cephalexin prescribed by his PCP. Return precautions given.     This chart was dictated using voice recognition software, Dragon. Despite the best efforts of this provider to proofread and correct errors, errors may still occur which can change documentation meaning.          Final Clinical Impression(s) / ED Diagnoses Final diagnoses:  Cellulitis of right leg    Rx / DC Orders ED Discharge Orders     None         Rexie Catena, PA-C 03/18/24 1717    Rosealee Concha, MD 03/18/24 1756

## 2024-03-20 DIAGNOSIS — Z8546 Personal history of malignant neoplasm of prostate: Secondary | ICD-10-CM | POA: Diagnosis not present

## 2024-03-22 DIAGNOSIS — L0390 Cellulitis, unspecified: Secondary | ICD-10-CM | POA: Diagnosis not present

## 2024-03-22 DIAGNOSIS — G5603 Carpal tunnel syndrome, bilateral upper limbs: Secondary | ICD-10-CM | POA: Diagnosis not present

## 2024-03-27 DIAGNOSIS — C61 Malignant neoplasm of prostate: Secondary | ICD-10-CM | POA: Diagnosis not present

## 2024-04-16 DIAGNOSIS — G5603 Carpal tunnel syndrome, bilateral upper limbs: Secondary | ICD-10-CM | POA: Diagnosis not present

## 2024-04-17 DIAGNOSIS — E1149 Type 2 diabetes mellitus with other diabetic neurological complication: Secondary | ICD-10-CM | POA: Diagnosis not present

## 2024-04-17 DIAGNOSIS — I10 Essential (primary) hypertension: Secondary | ICD-10-CM | POA: Diagnosis not present

## 2024-04-17 DIAGNOSIS — E1142 Type 2 diabetes mellitus with diabetic polyneuropathy: Secondary | ICD-10-CM | POA: Diagnosis not present

## 2024-04-17 DIAGNOSIS — E1139 Type 2 diabetes mellitus with other diabetic ophthalmic complication: Secondary | ICD-10-CM | POA: Diagnosis not present

## 2024-04-29 DIAGNOSIS — H401131 Primary open-angle glaucoma, bilateral, mild stage: Secondary | ICD-10-CM | POA: Diagnosis not present

## 2024-04-29 DIAGNOSIS — E7800 Pure hypercholesterolemia, unspecified: Secondary | ICD-10-CM | POA: Diagnosis not present

## 2024-04-29 DIAGNOSIS — H409 Unspecified glaucoma: Secondary | ICD-10-CM | POA: Diagnosis not present

## 2024-04-29 DIAGNOSIS — I10 Essential (primary) hypertension: Secondary | ICD-10-CM | POA: Diagnosis not present

## 2024-04-30 DIAGNOSIS — Z6837 Body mass index (BMI) 37.0-37.9, adult: Secondary | ICD-10-CM | POA: Diagnosis not present

## 2024-04-30 DIAGNOSIS — G5603 Carpal tunnel syndrome, bilateral upper limbs: Secondary | ICD-10-CM | POA: Diagnosis not present

## 2024-05-15 DIAGNOSIS — G5601 Carpal tunnel syndrome, right upper limb: Secondary | ICD-10-CM | POA: Diagnosis not present

## 2024-05-16 DIAGNOSIS — E1142 Type 2 diabetes mellitus with diabetic polyneuropathy: Secondary | ICD-10-CM | POA: Diagnosis not present

## 2024-05-16 DIAGNOSIS — I10 Essential (primary) hypertension: Secondary | ICD-10-CM | POA: Diagnosis not present

## 2024-05-16 DIAGNOSIS — E1139 Type 2 diabetes mellitus with other diabetic ophthalmic complication: Secondary | ICD-10-CM | POA: Diagnosis not present

## 2024-05-16 DIAGNOSIS — E1149 Type 2 diabetes mellitus with other diabetic neurological complication: Secondary | ICD-10-CM | POA: Diagnosis not present

## 2024-05-30 DIAGNOSIS — H401131 Primary open-angle glaucoma, bilateral, mild stage: Secondary | ICD-10-CM | POA: Diagnosis not present

## 2024-05-30 DIAGNOSIS — E1149 Type 2 diabetes mellitus with other diabetic neurological complication: Secondary | ICD-10-CM | POA: Diagnosis not present

## 2024-05-30 DIAGNOSIS — E1139 Type 2 diabetes mellitus with other diabetic ophthalmic complication: Secondary | ICD-10-CM | POA: Diagnosis not present

## 2024-05-30 DIAGNOSIS — I10 Essential (primary) hypertension: Secondary | ICD-10-CM | POA: Diagnosis not present

## 2024-05-30 DIAGNOSIS — E1142 Type 2 diabetes mellitus with diabetic polyneuropathy: Secondary | ICD-10-CM | POA: Diagnosis not present

## 2024-05-30 DIAGNOSIS — H409 Unspecified glaucoma: Secondary | ICD-10-CM | POA: Diagnosis not present

## 2024-05-30 DIAGNOSIS — E7800 Pure hypercholesterolemia, unspecified: Secondary | ICD-10-CM | POA: Diagnosis not present

## 2024-06-15 DIAGNOSIS — I10 Essential (primary) hypertension: Secondary | ICD-10-CM | POA: Diagnosis not present

## 2024-06-15 DIAGNOSIS — E1149 Type 2 diabetes mellitus with other diabetic neurological complication: Secondary | ICD-10-CM | POA: Diagnosis not present

## 2024-06-15 DIAGNOSIS — E1142 Type 2 diabetes mellitus with diabetic polyneuropathy: Secondary | ICD-10-CM | POA: Diagnosis not present

## 2024-06-15 DIAGNOSIS — E1139 Type 2 diabetes mellitus with other diabetic ophthalmic complication: Secondary | ICD-10-CM | POA: Diagnosis not present

## 2024-06-17 DIAGNOSIS — G5602 Carpal tunnel syndrome, left upper limb: Secondary | ICD-10-CM | POA: Diagnosis not present

## 2024-06-30 DIAGNOSIS — E1149 Type 2 diabetes mellitus with other diabetic neurological complication: Secondary | ICD-10-CM | POA: Diagnosis not present

## 2024-06-30 DIAGNOSIS — H409 Unspecified glaucoma: Secondary | ICD-10-CM | POA: Diagnosis not present

## 2024-06-30 DIAGNOSIS — E1142 Type 2 diabetes mellitus with diabetic polyneuropathy: Secondary | ICD-10-CM | POA: Diagnosis not present

## 2024-06-30 DIAGNOSIS — I10 Essential (primary) hypertension: Secondary | ICD-10-CM | POA: Diagnosis not present

## 2024-06-30 DIAGNOSIS — E7800 Pure hypercholesterolemia, unspecified: Secondary | ICD-10-CM | POA: Diagnosis not present

## 2024-06-30 DIAGNOSIS — H401131 Primary open-angle glaucoma, bilateral, mild stage: Secondary | ICD-10-CM | POA: Diagnosis not present

## 2024-06-30 DIAGNOSIS — E1139 Type 2 diabetes mellitus with other diabetic ophthalmic complication: Secondary | ICD-10-CM | POA: Diagnosis not present

## 2024-07-15 DIAGNOSIS — E1142 Type 2 diabetes mellitus with diabetic polyneuropathy: Secondary | ICD-10-CM | POA: Diagnosis not present

## 2024-07-15 DIAGNOSIS — I10 Essential (primary) hypertension: Secondary | ICD-10-CM | POA: Diagnosis not present

## 2024-07-15 DIAGNOSIS — E1139 Type 2 diabetes mellitus with other diabetic ophthalmic complication: Secondary | ICD-10-CM | POA: Diagnosis not present

## 2024-07-15 DIAGNOSIS — E1149 Type 2 diabetes mellitus with other diabetic neurological complication: Secondary | ICD-10-CM | POA: Diagnosis not present

## 2024-07-26 DIAGNOSIS — H401131 Primary open-angle glaucoma, bilateral, mild stage: Secondary | ICD-10-CM | POA: Diagnosis not present

## 2024-07-26 DIAGNOSIS — H524 Presbyopia: Secondary | ICD-10-CM | POA: Diagnosis not present

## 2024-07-26 DIAGNOSIS — D3131 Benign neoplasm of right choroid: Secondary | ICD-10-CM | POA: Diagnosis not present

## 2024-07-30 DIAGNOSIS — E7800 Pure hypercholesterolemia, unspecified: Secondary | ICD-10-CM | POA: Diagnosis not present

## 2024-07-30 DIAGNOSIS — H409 Unspecified glaucoma: Secondary | ICD-10-CM | POA: Diagnosis not present

## 2024-07-30 DIAGNOSIS — I10 Essential (primary) hypertension: Secondary | ICD-10-CM | POA: Diagnosis not present

## 2024-07-30 DIAGNOSIS — E1142 Type 2 diabetes mellitus with diabetic polyneuropathy: Secondary | ICD-10-CM | POA: Diagnosis not present

## 2024-07-30 DIAGNOSIS — E1149 Type 2 diabetes mellitus with other diabetic neurological complication: Secondary | ICD-10-CM | POA: Diagnosis not present

## 2024-07-30 DIAGNOSIS — H401131 Primary open-angle glaucoma, bilateral, mild stage: Secondary | ICD-10-CM | POA: Diagnosis not present

## 2024-07-30 DIAGNOSIS — E1139 Type 2 diabetes mellitus with other diabetic ophthalmic complication: Secondary | ICD-10-CM | POA: Diagnosis not present

## 2024-08-14 DIAGNOSIS — E1149 Type 2 diabetes mellitus with other diabetic neurological complication: Secondary | ICD-10-CM | POA: Diagnosis not present

## 2024-08-14 DIAGNOSIS — E1139 Type 2 diabetes mellitus with other diabetic ophthalmic complication: Secondary | ICD-10-CM | POA: Diagnosis not present

## 2024-08-14 DIAGNOSIS — I10 Essential (primary) hypertension: Secondary | ICD-10-CM | POA: Diagnosis not present

## 2024-08-14 DIAGNOSIS — E1142 Type 2 diabetes mellitus with diabetic polyneuropathy: Secondary | ICD-10-CM | POA: Diagnosis not present

## 2024-08-30 DIAGNOSIS — E7800 Pure hypercholesterolemia, unspecified: Secondary | ICD-10-CM | POA: Diagnosis not present

## 2024-08-30 DIAGNOSIS — E1142 Type 2 diabetes mellitus with diabetic polyneuropathy: Secondary | ICD-10-CM | POA: Diagnosis not present

## 2024-08-30 DIAGNOSIS — I10 Essential (primary) hypertension: Secondary | ICD-10-CM | POA: Diagnosis not present

## 2024-08-30 DIAGNOSIS — H409 Unspecified glaucoma: Secondary | ICD-10-CM | POA: Diagnosis not present

## 2024-08-30 DIAGNOSIS — E1149 Type 2 diabetes mellitus with other diabetic neurological complication: Secondary | ICD-10-CM | POA: Diagnosis not present

## 2024-08-30 DIAGNOSIS — E1139 Type 2 diabetes mellitus with other diabetic ophthalmic complication: Secondary | ICD-10-CM | POA: Diagnosis not present

## 2024-08-30 DIAGNOSIS — H401131 Primary open-angle glaucoma, bilateral, mild stage: Secondary | ICD-10-CM | POA: Diagnosis not present

## 2024-09-13 DIAGNOSIS — E1142 Type 2 diabetes mellitus with diabetic polyneuropathy: Secondary | ICD-10-CM | POA: Diagnosis not present

## 2024-09-13 DIAGNOSIS — E1149 Type 2 diabetes mellitus with other diabetic neurological complication: Secondary | ICD-10-CM | POA: Diagnosis not present

## 2024-09-13 DIAGNOSIS — I10 Essential (primary) hypertension: Secondary | ICD-10-CM | POA: Diagnosis not present

## 2024-09-13 DIAGNOSIS — E1139 Type 2 diabetes mellitus with other diabetic ophthalmic complication: Secondary | ICD-10-CM | POA: Diagnosis not present

## 2024-09-16 DIAGNOSIS — H401131 Primary open-angle glaucoma, bilateral, mild stage: Secondary | ICD-10-CM | POA: Diagnosis not present

## 2024-09-16 DIAGNOSIS — E7800 Pure hypercholesterolemia, unspecified: Secondary | ICD-10-CM | POA: Diagnosis not present

## 2024-09-16 DIAGNOSIS — E1142 Type 2 diabetes mellitus with diabetic polyneuropathy: Secondary | ICD-10-CM | POA: Diagnosis not present

## 2024-09-16 DIAGNOSIS — I10 Essential (primary) hypertension: Secondary | ICD-10-CM | POA: Diagnosis not present

## 2024-09-16 DIAGNOSIS — E1149 Type 2 diabetes mellitus with other diabetic neurological complication: Secondary | ICD-10-CM | POA: Diagnosis not present

## 2024-09-16 DIAGNOSIS — Z0000 Encounter for general adult medical examination without abnormal findings: Secondary | ICD-10-CM | POA: Diagnosis not present

## 2024-09-16 DIAGNOSIS — G4733 Obstructive sleep apnea (adult) (pediatric): Secondary | ICD-10-CM | POA: Diagnosis not present

## 2024-09-16 DIAGNOSIS — E1139 Type 2 diabetes mellitus with other diabetic ophthalmic complication: Secondary | ICD-10-CM | POA: Diagnosis not present

## 2024-09-29 DIAGNOSIS — E1139 Type 2 diabetes mellitus with other diabetic ophthalmic complication: Secondary | ICD-10-CM | POA: Diagnosis not present

## 2024-09-29 DIAGNOSIS — H401131 Primary open-angle glaucoma, bilateral, mild stage: Secondary | ICD-10-CM | POA: Diagnosis not present

## 2024-09-29 DIAGNOSIS — I10 Essential (primary) hypertension: Secondary | ICD-10-CM | POA: Diagnosis not present

## 2024-09-29 DIAGNOSIS — E1149 Type 2 diabetes mellitus with other diabetic neurological complication: Secondary | ICD-10-CM | POA: Diagnosis not present

## 2024-09-29 DIAGNOSIS — H409 Unspecified glaucoma: Secondary | ICD-10-CM | POA: Diagnosis not present

## 2024-09-29 DIAGNOSIS — E7800 Pure hypercholesterolemia, unspecified: Secondary | ICD-10-CM | POA: Diagnosis not present

## 2024-09-29 DIAGNOSIS — E1142 Type 2 diabetes mellitus with diabetic polyneuropathy: Secondary | ICD-10-CM | POA: Diagnosis not present

## 2024-10-07 DIAGNOSIS — M25511 Pain in right shoulder: Secondary | ICD-10-CM | POA: Diagnosis not present
